# Patient Record
Sex: Female | Born: 1990 | Race: Black or African American | Hispanic: No | Marital: Single | Smoking: Never smoker
Health system: Southern US, Community
[De-identification: ages and names within clinical notes are randomized; demographics above are authoritative.]

## PROBLEM LIST (undated history)

## (undated) ENCOUNTER — Inpatient Hospital Stay (HOSPITAL_COMMUNITY): Payer: Self-pay

## (undated) DIAGNOSIS — R1110 Vomiting, unspecified: Secondary | ICD-10-CM

## (undated) DIAGNOSIS — I10 Essential (primary) hypertension: Secondary | ICD-10-CM

## (undated) DIAGNOSIS — J387 Other diseases of larynx: Secondary | ICD-10-CM

## (undated) DIAGNOSIS — D573 Sickle-cell trait: Secondary | ICD-10-CM

## (undated) DIAGNOSIS — J45990 Exercise induced bronchospasm: Secondary | ICD-10-CM

## (undated) HISTORY — PX: WISDOM TOOTH EXTRACTION: SHX21

## (undated) HISTORY — PX: UPPER GI ENDOSCOPY: SHX6162

---
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
## 2010-03-28 ENCOUNTER — Emergency Department (HOSPITAL_COMMUNITY): Admission: EM | Admit: 2010-03-28 | Discharge: 2010-03-28 | Payer: Self-pay | Admitting: Family Medicine

## 2011-06-06 ENCOUNTER — Encounter (HOSPITAL_COMMUNITY): Payer: Self-pay | Admitting: *Deleted

## 2011-06-06 ENCOUNTER — Emergency Department (HOSPITAL_COMMUNITY)
Admission: EM | Admit: 2011-06-06 | Discharge: 2011-06-06 | Disposition: A | Discharge status: Home / Self Care | Payer: PRIVATE HEALTH INSURANCE | Attending: Emergency Medicine | Admitting: Emergency Medicine

## 2011-06-06 ENCOUNTER — Emergency Department (HOSPITAL_COMMUNITY): Payer: PRIVATE HEALTH INSURANCE

## 2011-06-06 DIAGNOSIS — T733XXA Exhaustion due to excessive exertion, initial encounter: Secondary | ICD-10-CM | POA: Insufficient documentation

## 2011-06-06 DIAGNOSIS — Y9302 Activity, running: Secondary | ICD-10-CM | POA: Insufficient documentation

## 2011-06-06 DIAGNOSIS — IMO0002 Reserved for concepts with insufficient information to code with codable children: Secondary | ICD-10-CM | POA: Insufficient documentation

## 2011-06-06 DIAGNOSIS — S8391XA Sprain of unspecified site of right knee, initial encounter: Secondary | ICD-10-CM

## 2011-06-06 DIAGNOSIS — M25569 Pain in unspecified knee: Secondary | ICD-10-CM | POA: Insufficient documentation

## 2011-06-06 MED ORDER — IBUPROFEN 800 MG PO TABS: 800.0000 mg | ORAL_TABLET | Freq: Three times a day (TID) | ORAL | Status: AC

## 2011-06-06 NOTE — Progress Notes (Signed)
Orthopedic Tech Progress Note Patient Details:  Elizabeth Fisher 08/17/1990 [id number]  Other Ortho Devices Type of Ortho Device: Knee Sleeve Ortho Device Location: right knee Ortho Device Interventions: Application   Nikki Dom 06/06/2011, 11:21 PM

## 2011-06-06 NOTE — ED Notes (Signed)
TRANSPORTED TO X-RAY BY RAD TECH.

## 2011-06-06 NOTE — ED Notes (Signed)
Rt knee pain since last Wednesday.  No known injury

## 2011-06-06 NOTE — ED Provider Notes (Signed)
History     CSN: [id number]  Arrival date & time 06/06/11  2035   First MD Initiated Contact with Patient 06/06/11 2127      Chief Complaint  Patient presents with  . Knee Pain    (Consider location/radiation/quality/duration/timing/severity/associated sxs/prior treatment) HPI Comments: Patient reports right lateral knee pain for about 4-5 days - she denies any injury to the knee but reports that she runs about 2 miles per day - states she has the sensation that her knee may "give out" - denies any swelling to the knee  Patient is a 21 y.o. female presenting with knee pain. The history is provided by the patient. No language interpreter was used.  Knee Pain This is a new problem. The current episode started in the past 7 days. The problem occurs constantly. The problem has been unchanged. Associated symptoms include arthralgias. Pertinent negatives include no anorexia, chest pain, chills, coughing, diaphoresis, fatigue, fever, joint swelling, myalgias, neck pain, numbness, rash, sore throat, visual change, vomiting or weakness. The symptoms are aggravated by bending and twisting. She has tried nothing for the symptoms. The treatment provided no relief.    History reviewed. No pertinent past medical history.  History reviewed. No pertinent past surgical history.  History reviewed. No pertinent family history.  History  Substance Use Topics  . Smoking status: Never Smoker   . Smokeless tobacco: Not on file  . Alcohol Use: No    OB History    Grav Para Term Preterm Abortions TAB SAB Ect Mult Living                  Review of Systems  Constitutional: Negative for fever, chills, diaphoresis and fatigue.  HENT: Negative for sore throat and neck pain.   Respiratory: Negative for cough.   Cardiovascular: Negative for chest pain.  Gastrointestinal: Negative for vomiting and anorexia.  Musculoskeletal: Positive for arthralgias. Negative for myalgias and joint swelling.  Skin:  Negative for rash.  Neurological: Negative for weakness and numbness.  All other systems reviewed and are negative.    Allergies  Review of patient's allergies indicates no known allergies.  Home Medications  No current outpatient prescriptions on file.  BP 121/80  Pulse 74  Temp(Src) 98.1 F (36.7 C) (Oral)  Resp 18  SpO2 100%  LMP 05/26/2011  Physical Exam  Nursing note and vitals reviewed. Constitutional: She is oriented to person, place, and time. She appears well-developed and well-nourished. No distress.  HENT:  Head: Normocephalic and atraumatic.  Right Ear: External ear normal.  Left Ear: External ear normal.  Nose: Nose normal.  Mouth/Throat: Oropharynx is clear and moist. No oropharyngeal exudate.  Eyes: Conjunctivae are normal. Pupils are equal, round, and reactive to light. No scleral icterus.  Neck: Normal range of motion. Neck supple.  Cardiovascular: Normal rate, regular rhythm and normal heart sounds.  Exam reveals no gallop and no friction rub.   No murmur heard. Pulmonary/Chest: Effort normal and breath sounds normal. No respiratory distress.  Abdominal: Soft. Bowel sounds are normal. There is no tenderness.  Musculoskeletal:       Right knee: She exhibits normal range of motion, no swelling, no effusion, no deformity, normal alignment, no LCL laxity, normal meniscus and no MCL laxity. tenderness found. Lateral joint line tenderness noted.  Lymphadenopathy:    She has no cervical adenopathy.  Neurological: She is alert and oriented to person, place, and time. No cranial nerve deficit.  Skin: Skin is warm and dry. No rash  noted. No erythema. No pallor.  Psychiatric: She has a normal mood and affect. Her behavior is normal. Judgment and thought content normal.    ED Course  Procedures (including critical care time)  Labs Reviewed - No data to display Dg Knee Complete 4 Views Right  06/06/2011  *RADIOLOGY REPORT*  Clinical Data: Lateral right knee pain  for 5 days.  No injury.  RIGHT KNEE - COMPLETE 4+ VIEW  Comparison: None.  Findings: The right knee appears intact. No evidence of acute fracture or subluxation.  No focal bone lesions.  Bone matrix and cortex appear intact.  No abnormal radiopaque densities in the soft tissues.  No significant effusion.  IMPRESSION: No acute bony abnormalities demonstrated.  Original Report Authenticated By: Marlon Pel, M.D.     Right knee pain    MDM  No evidence of fracture, no ligamental laxity, negative meniscus injury signs, I suspect sprain - will place in knee sleeve and anti-inflammatories - will refer to ortho for further evaluation.        Izola Price [place], [place] 06/06/11 2309

## 2011-06-08 NOTE — ED Provider Notes (Signed)
Medical screening examination/treatment/procedure(s) were performed by non-physician practitioner and as supervising physician I was immediately available for consultation/collaboration.   Elaiza Shoberg L Evamae Rowen, MD 06/08/11 2307 

## 2011-07-04 ENCOUNTER — Encounter (HOSPITAL_COMMUNITY): Payer: Self-pay | Admitting: *Deleted

## 2011-07-04 ENCOUNTER — Emergency Department (HOSPITAL_COMMUNITY)
Admission: EM | Admit: 2011-07-04 | Discharge: 2011-07-04 | Disposition: A | Discharge status: Home / Self Care | Payer: PRIVATE HEALTH INSURANCE | Source: Home / Self Care | Attending: Emergency Medicine | Admitting: Emergency Medicine

## 2011-07-04 DIAGNOSIS — B2790 Infectious mononucleosis, unspecified without complication: Secondary | ICD-10-CM

## 2011-07-04 LAB — POCT INFECTIOUS MONO SCREEN: Mono Screen: POSITIVE — AB

## 2011-07-04 MED ORDER — IBUPROFEN 600 MG PO TABS: 600.0000 mg | ORAL_TABLET | Freq: Four times a day (QID) | ORAL | Status: AC | PRN

## 2011-07-04 MED ORDER — LIDOCAINE VISCOUS 2 % MT SOLN: 10.0000 mL | Freq: Three times a day (TID) | OROMUCOSAL | Status: AC | PRN

## 2011-07-04 NOTE — ED Provider Notes (Signed)
History     CSN: [id number]  Arrival date & time 07/04/11  7829   First MD Initiated Contact with Patient 07/04/11 1103      Chief Complaint  Patient presents with  . Sore Throat    (Consider location/radiation/quality/duration/timing/severity/associated sxs/prior treatment) HPI Comments: Patient with sore throat starting a week ago. States his throat is sore all day long, and not worse in the morning or in the evening when she lies down or after she eats. No rhinorrhea, postnasal drip, coughing, wheezing, shortness of breath, abdominal pain, rash. Patient states that she had some reflux symptoms, followed by the sore throat, but has not had any further episodes of reflux. No nausea, vomiting, drooling, trismus, voice changes. No excessive fatigue. No known sick contacts  ROS as noted in HPI. All other ROS negative.   Patient is a 21 y.o. female presenting with pharyngitis. The history is provided by the patient.  Sore Throat This is a new problem. The current episode started more than 2 days ago. The problem occurs constantly. The problem has not changed since onset.Pertinent negatives include no chest pain and no abdominal pain. The symptoms are aggravated by swallowing. She has tried nothing for the symptoms. The treatment provided no relief.    History reviewed. No pertinent past medical history.  History reviewed. No pertinent past surgical history.  History reviewed. No pertinent family history.  History  Substance Use Topics  . Smoking status: Never Smoker   . Smokeless tobacco: Not on file  . Alcohol Use: No    OB History    Grav Para Term Preterm Abortions TAB SAB Ect Mult Living                  Review of Systems  Cardiovascular: Negative for chest pain.  Gastrointestinal: Negative for abdominal pain.    Allergies  Review of patient's allergies indicates no known allergies.  Home Medications   Current Outpatient Rx  Name Route Sig Dispense Refill  .  IBUPROFEN 600 MG PO TABS Oral Take 1 tablet (600 mg total) by mouth every 6 (six) hours as needed for pain. 30 tablet 0  . LIDOCAINE VISCOUS 2 % MT SOLN Oral Take 10 mLs by mouth 3 (three) times daily as needed for pain. Swish and spit. Do not swallow. 100 mL 0    BP 104/69  Pulse 78  Temp(Src) 98 F (36.7 C) (Oral)  Resp 14  SpO2 100%  LMP 06/19/2011  Physical Exam  Nursing note and vitals reviewed. Constitutional: She is oriented to person, place, and time. She appears well-developed and well-nourished. No distress.  HENT:  Head: Normocephalic and atraumatic. No trismus in the jaw.  Right Ear: Tympanic membrane normal.  Left Ear: Tympanic membrane normal.  Nose: Nose normal.  Mouth/Throat: Uvula is midline. Posterior oropharyngeal erythema present. No tonsillar abscesses.       Tonsils red, slightly enlarged. Exudates bilaterally  Eyes: Conjunctivae and EOM are normal.  Neck: Normal range of motion. Neck supple.       Shotty cervical lymphadenopathy bilaterally  Cardiovascular: Normal rate, regular rhythm, normal heart sounds and intact distal pulses.   Pulmonary/Chest: Effort normal.  Abdominal: Bowel sounds are normal. She exhibits no distension. There is no splenomegaly.  Musculoskeletal: Normal range of motion.  Neurological: She is alert and oriented to person, place, and time.  Skin: Skin is warm and dry.  Psychiatric: She has a normal mood and affect. Her behavior is normal. Judgment and thought content  normal.    ED Course  Procedures (including critical care time)  Labs Reviewed  POCT INFECTIOUS MONO SCREEN - Abnormal; Notable for the following:    Mono Screen POSITIVE (*) POINT OF CARE RESULT   All other components within normal limits  POCT RAPID STREP A ([facility] ONLY)  STREP A DNA PROBE   No results found.   1. Mononucleosis     Results for orders placed during the hospital encounter of 07/04/11  POCT RAPID STREP A ([facility] ONLY)       Component Value Range   Streptococcus, Group A Screen (Direct) NEGATIVE  NEGATIVE   POCT INFECTIOUS MONO SCREEN      Component Value Range   Mono Screen POSITIVE (*) NEGATIVE      MDM  Rapid strep negative. Patient with significant exudates and tonsils. Will check mono. If this is negative, we'll send off a throat culture. Patient agrees to provide working phone number. If her results positive for strep, will call in penicillin.  Monospot positive. Discussed results with patient. Do supportive treatment, and have her followup with primary care physician.  Luiz Blare, MD 07/04/11 1341

## 2011-07-04 NOTE — Discharge Instructions (Signed)
He may take medication as written. Do not swallow the viscous lidocaine. Followup with your Dr. prior to returning to full physical activity/contact sports. Return if he is worse, or for any other concerns.   Infectious Mononucleosis Mono (infectiousmononucleosis) is a common viral infection that is caused by Epstein-Barr virus (EBV). Mono is contagious, and is commonly spread via saliva. This is why it is also known as the "kissing disease." Often, children with the virus may have no symptoms. However, in adults and adolescents, mono may cause an individual to miss days of work or school. SYMPTOMS   No symptoms, for up to a month after being infected.   Extreme fatigue.   Tiredness (sleeping 12 to 16 hours a day.)   Fever.   Headaches.   Muscle aches.   Sore throat.   Swollen bumps on the neck that you can feel, and are tender (lymph nodes).   Loss of appetite.   Nausea.   Joint aches.   Rash.   Feeling of fullness in your stomach.  PREVENTION   Avoid contact with infected saliva.   Avoid sharing eating utensils.   Avoid sharing food.  TREATMENT  Mono has no specific treatment. It is recommended that individuals with the illness rest and drink plenty of fluids. Over-the-counter medicines for fever and sore throat may be taken, if such symptoms are present. Rarely, the infection may cause an abscess (collection of pus surrounded by inflamed tissue) in the tonsils, for which antibiotics will be prescribed. Mono typically causes the liver and spleen to become enlarged. For this reason, you should avoid drinking alcohol, contact sports, heavy lifting, or any strenuous exercise, to reduce the risk of rupturing your spleen, until it returns to normal size. Symptoms typically improve after 1 to 2 weeks, but returning to sports may take a couple months. Document Released: 04/25/2005 Document Revised: 01/05/2011 Document Reviewed: 08/07/2008 [facility] Patient Information 2012  [place], [place].

## 2011-07-04 NOTE — ED Notes (Signed)
Pt  Reports  Symptoms  Of sorethroat  With pain on  Swallowing   -  Symptoms    X  1  Week  Pt  Sitting upright on  Exam table appears  In no  Acute  Distress    Skin is  Warm  /  Dry  Speaking in  Complete  sentances

## 2011-07-05 LAB — STREP A DNA PROBE: Group A Strep Probe: NEGATIVE

## 2013-06-21 ENCOUNTER — Emergency Department (INDEPENDENT_AMBULATORY_CARE_PROVIDER_SITE_OTHER)
Admission: EM | Admit: 2013-06-21 | Discharge: 2013-06-21 | Disposition: A | Discharge status: Home / Self Care | Payer: 59 | Source: Home / Self Care

## 2013-06-21 ENCOUNTER — Encounter (HOSPITAL_COMMUNITY): Payer: Self-pay | Admitting: Emergency Medicine

## 2013-06-21 DIAGNOSIS — R1013 Epigastric pain: Secondary | ICD-10-CM

## 2013-06-21 DIAGNOSIS — K5900 Constipation, unspecified: Secondary | ICD-10-CM

## 2013-06-21 DIAGNOSIS — R112 Nausea with vomiting, unspecified: Secondary | ICD-10-CM

## 2013-06-21 LAB — POCT URINALYSIS DIP (DEVICE)
BILIRUBIN URINE: NEGATIVE
GLUCOSE, UA: NEGATIVE mg/dL
KETONES UR: NEGATIVE mg/dL
LEUKOCYTES UA: NEGATIVE
Nitrite: NEGATIVE
PROTEIN: NEGATIVE mg/dL
SPECIFIC GRAVITY, URINE: 1.01 (ref 1.005–1.030)
Urobilinogen, UA: 0.2 mg/dL (ref 0.0–1.0)
pH: 7 (ref 5.0–8.0)

## 2013-06-21 LAB — POCT PREGNANCY, URINE: PREG TEST UR: NEGATIVE

## 2013-06-21 MED ORDER — ONDANSETRON HCL 4 MG PO TABS: 4.0000 mg | ORAL_TABLET | Freq: Four times a day (QID) | ORAL | Status: DC

## 2013-06-21 NOTE — ED Notes (Signed)
Pt c/o epigastric pain that woke her up today around 0700 Reports x1 episode of emesis.  Denies f/d, urinary sxs, gyn sxs LMP = today  Alert w/no signs of acute distress.

## 2013-06-21 NOTE — ED Provider Notes (Signed)
CSN: [id number]     Arrival date & time 06/21/13  1048 History   First MD Initiated Contact with Patient 06/21/13 1155     Chief Complaint  Patient presents with  . Abdominal Pain     (Consider location/radiation/quality/duration/timing/severity/associated sxs/prior Treatment) HPI Comments: 23 year old female awoke this morning approximately 700 hours with epigastric pain followed by an episode of vomiting. States that she felt like her abdomen was swollen and bloated. She has had nausea most of this morning. The pain has migrated from the epigastrium to the upper right chest into the back. Denies fever, upper respiratory symptoms or constitutional symptoms.   History reviewed. No pertinent past medical history. History reviewed. No pertinent past surgical history. No family history on file. History  Substance Use Topics  . Smoking status: Never Smoker   . Smokeless tobacco: Not on file  . Alcohol Use: No   OB History   Grav Para Term Preterm Abortions TAB SAB Ect Mult Living                 Review of Systems  Constitutional: Positive for activity change and appetite change. Negative for fever, chills and fatigue.  HENT: Negative.   Respiratory: Negative for cough, choking, chest tightness and shortness of breath.   Cardiovascular: Positive for chest pain.  Gastrointestinal: Positive for nausea, vomiting, abdominal pain and constipation. Negative for diarrhea and blood in stool.  Genitourinary: Negative.   Musculoskeletal: Negative.   Neurological: Negative.   Psychiatric/Behavioral: Negative.       Allergies  Review of patient's allergies indicates no known allergies.  Home Medications   Current Outpatient Rx  Name  Route  Sig  Dispense  Refill  . ondansetron (ZOFRAN) 4 MG tablet   Oral   Take 1 tablet (4 mg total) by mouth every 6 (six) hours.   12 tablet   0    BP 126/75  Pulse 65  Temp(Src) 98.4 F (36.9 C) (Oral)  Resp 18  SpO2 99%  LMP  06/21/2013 Physical Exam  Nursing note and vitals reviewed. Constitutional: She is oriented to person, place, and time. She appears well-developed and well-nourished. No distress.  Eyes: Conjunctivae and EOM are normal.  Neck: Normal range of motion. Neck supple.  Cardiovascular: Normal rate, regular rhythm and normal heart sounds.   Pulmonary/Chest: Effort normal and breath sounds normal. No respiratory distress. She has no wheezes. She has no rales. She exhibits tenderness.  There is moderate to severe tenderness to the right para sternum. Patient states this reproduced the pain for which he initially complained.  The pain in the back is located primarily to the left back at the level of the upper abdomen, just opposite of where the left upper quadrant tenderness was located.  Abdominal: Soft. Bowel sounds are normal. She exhibits no distension and no mass. There is tenderness. There is no rebound and no guarding.  Minor tenderness in the epigastrium. Mild tenderness in the upper most left upper quadrant. This is referred posteriorly. The abdomen percus tympanic in the upper half and the lower half.  Musculoskeletal: She exhibits no edema and no tenderness.  Neurological: She is alert and oriented to person, place, and time. She exhibits normal muscle tone.  Skin: Skin is warm and dry. No rash noted.  Psychiatric: She has a normal mood and affect.    ED Course  Procedures (including critical care time) Labs Review Labs Reviewed  POCT URINALYSIS DIP (DEVICE) - Abnormal; Notable for the following:  Hgb urine dipstick TRACE (*)    All other components within normal limits  POCT PREGNANCY, URINE   Imaging Review No results found. Results for orders placed during the hospital encounter of 06/21/13  POCT URINALYSIS DIP (DEVICE)      Result Value Ref Range   Glucose, UA NEGATIVE  NEGATIVE mg/dL   Bilirubin Urine NEGATIVE  NEGATIVE   Ketones, ur NEGATIVE  NEGATIVE mg/dL   Specific  Gravity, Urine 1.010  1.005 - 1.030   Hgb urine dipstick TRACE (*) NEGATIVE   pH 7.0  5.0 - 8.0   Protein, ur NEGATIVE  NEGATIVE mg/dL   Urobilinogen, UA 0.2  0.0 - 1.0 mg/dL   Nitrite NEGATIVE  NEGATIVE   Leukocytes, UA NEGATIVE  NEGATIVE  POCT PREGNANCY, URINE      Result Value Ref Range   Preg Test, Ur NEGATIVE  NEGATIVE      MDM   Final diagnoses:  Epigastric pain  Constipation  Nausea and vomiting    Patient has not had a bowel movement in 2 days I suspect part of the problem is constipation. Her abdomen percusses tympanic in the epigastrium and upper quadrants. It is dull in the lower quadrants. Minor tenderness in the epigastrium. She does not have an acute abdomen. Zofran 4 mg every 6 hours when necessary nausea and vomiting Recommend MiraLax as directed for constipation Eat. higher fiber foods.    Hayden Rasmussen, NP 06/21/13 1230

## 2013-06-21 NOTE — ED Provider Notes (Signed)
Medical screening examination/treatment/procedure(s) were performed by resident physician or non-physician practitioner and as supervising physician I was immediately available for consultation/collaboration.   Barkley BrunsKINDL,Mauri Tolen DOUGLAS MD.   Linna HoffJames D Asa Baudoin, MD 06/21/13 1310

## 2013-06-21 NOTE — Discharge Instructions (Signed)
Abdominal Pain, Women °Abdominal (stomach, pelvic, or belly) pain can be caused by many things. It is important to tell your doctor: °· The location of the pain. °· Does it come and go or is it present all the time? °· Are there things that start the pain (eating certain foods, exercise)? °· Are there other symptoms associated with the pain (fever, nausea, vomiting, diarrhea)? °All of this is helpful to know when trying to find the cause of the pain. °CAUSES  °· Stomach: virus or bacteria infection, or ulcer. °· Intestine: appendicitis (inflamed appendix), regional ileitis (Crohn's disease), ulcerative colitis (inflamed colon), irritable bowel syndrome, diverticulitis (inflamed diverticulum of the colon), or cancer of the stomach or intestine. °· Gallbladder disease or stones in the gallbladder. °· Kidney disease, kidney stones, or infection. °· Pancreas infection or cancer. °· Fibromyalgia (pain disorder). °· Diseases of the female organs: °· Uterus: fibroid (non-cancerous) tumors or infection. °· Fallopian tubes: infection or tubal pregnancy. °· Ovary: cysts or tumors. °· Pelvic adhesions (scar tissue). °· Endometriosis (uterus lining tissue growing in the pelvis and on the pelvic organs). °· Pelvic congestion syndrome (female organs filling up with blood just before the menstrual period). °· Pain with the menstrual period. °· Pain with ovulation (producing an egg). °· Pain with an IUD (intrauterine device, birth control) in the uterus. °· Cancer of the female organs. °· Functional pain (pain not caused by a disease, may improve without treatment). °· Psychological pain. °· Depression. °DIAGNOSIS  °Your doctor will decide the seriousness of your pain by doing an examination. °· Blood tests. °· X-rays. °· Ultrasound. °· CT scan (computed tomography, special type of X-ray). °· MRI (magnetic resonance imaging). °· Cultures, for infection. °· Barium enema (dye inserted in the large intestine, to better view it with  X-rays). °· Colonoscopy (looking in intestine with a lighted tube). °· Laparoscopy (minor surgery, looking in abdomen with a lighted tube). °· Major abdominal exploratory surgery (looking in abdomen with a large incision). °TREATMENT  °The treatment will depend on the cause of the pain.  °· Many cases can be observed and treated at home. °· Over-the-counter medicines recommended by your caregiver. °· Prescription medicine. °· Antibiotics, for infection. °· Birth control pills, for painful periods or for ovulation pain. °· Hormone treatment, for endometriosis. °· Nerve blocking injections. °· Physical therapy. °· Antidepressants. °· Counseling with a psychologist or psychiatrist. °· Minor or major surgery. °HOME CARE INSTRUCTIONS  °· Do not take laxatives, unless directed by your caregiver. °· Take over-the-counter pain medicine only if ordered by your caregiver. Do not take aspirin because it can cause an upset stomach or bleeding. °· Try a clear liquid diet (broth or water) as ordered by your caregiver. Slowly move to a bland diet, as tolerated, if the pain is related to the stomach or intestine. °· Have a thermometer and take your temperature several times a day, and record it. °· Bed rest and sleep, if it helps the pain. °· Avoid sexual intercourse, if it causes pain. °· Avoid stressful situations. °· Keep your follow-up appointments and tests, as your caregiver orders. °· If the pain does not go away with medicine or surgery, you may try: °· Acupuncture. °· Relaxation exercises (yoga, meditation). °· Group therapy. °· Counseling. °SEEK MEDICAL CARE IF:  °· You notice certain foods cause stomach pain. °· Your home care treatment is not helping your pain. °· You need stronger pain medicine. °· You want your IUD removed. °· You feel faint or   lightheaded.  You develop nausea and vomiting.  You develop a rash.  You are having side effects or an allergy to your medicine. SEEK IMMEDIATE MEDICAL CARE IF:   Your  pain does not go away or gets worse.  You have a fever.  Your pain is felt only in portions of the abdomen. The right side could possibly be appendicitis. The left lower portion of the abdomen could be colitis or diverticulitis.  You are passing blood in your stools (bright red or black tarry stools, with or without vomiting).  You have blood in your urine.  You develop chills, with or without a fever.  You pass out. MAKE SURE YOU:   Understand these instructions.  Will watch your condition.  Will get help right away if you are not doing well or get worse. Document Released: 02/20/2007 Document Revised: 07/18/2011 Document Reviewed: 03/12/2009 [facility] Patient Information 2014 [place], [place].  Constipation, Adult Constipation is when a person has fewer than 3 bowel movements a week; has difficulty having a bowel movement; or has stools that are dry, hard, or larger than normal. As people grow older, constipation is more common. If you try to fix constipation with medicines that make you have a bowel movement (laxatives), the problem may get worse. Long-term laxative use may cause the muscles of the colon to become weak. A low-fiber diet, not taking in enough fluids, and taking certain medicines may make constipation worse. CAUSES   Certain medicines, such as antidepressants, pain medicine, iron supplements, antacids, and water pills.   Certain diseases, such as diabetes, irritable bowel syndrome (IBS), thyroid disease, or depression.   Not drinking enough water.   Not eating enough fiber-rich foods.   Stress or travel.  Lack of physical activity or exercise.  Not going to the restroom when there is the urge to have a bowel movement.  Ignoring the urge to have a bowel movement.  Using laxatives too much. SYMPTOMS   Having fewer than 3 bowel movements a week.   Straining to have a bowel movement.   Having hard, dry, or larger than normal stools.   Feeling  full or bloated.   Pain in the lower abdomen.  Not feeling relief after having a bowel movement. DIAGNOSIS  Your caregiver will take a medical history and perform a physical exam. Further testing may be done for severe constipation. Some tests may include:   A barium enema X-ray to examine your rectum, colon, and sometimes, your small intestine.  A sigmoidoscopy to examine your lower colon.  A colonoscopy to examine your entire colon. TREATMENT  Treatment will depend on the severity of your constipation and what is causing it. Some dietary treatments include drinking more fluids and eating more fiber-rich foods. Lifestyle treatments may include regular exercise. If these diet and lifestyle recommendations do not help, your caregiver may recommend taking over-the-counter laxative medicines to help you have bowel movements. Prescription medicines may be prescribed if over-the-counter medicines do not work.  HOME CARE INSTRUCTIONS   Increase dietary fiber in your diet, such as fruits, vegetables, whole grains, and beans. Limit high-fat and processed sugars in your diet, such as [place] fries, hamburgers, cookies, candies, and soda.   A fiber supplement may be added to your diet if you cannot get enough fiber from foods.   Drink enough fluids to keep your urine clear or pale yellow.   Exercise regularly or as directed by your caregiver.   Go to the restroom when you have  the urge to go. Do not hold it.  Only take medicines as directed by your caregiver. Do not take other medicines for constipation without talking to your caregiver first. SEEK IMMEDIATE MEDICAL CARE IF:   You have bright red blood in your stool.   Your constipation lasts for more than 4 days or gets worse.   You have abdominal or rectal pain.   You have thin, pencil-like stools.  You have unexplained weight loss. MAKE SURE YOU:   Understand these instructions.  Will watch your condition.  Will get help  right away if you are not doing well or get worse. Document Released: 01/22/2004 Document Revised: 07/18/2011 Document Reviewed: 02/04/2013 [facility] Patient Information 2014 [place], [place].  Nausea and Vomiting Nausea means you feel sick to your stomach. Throwing up (vomiting) is a reflex where stomach contents come out of your mouth. HOME CARE   Take medicine as told by your doctor.  Do not force yourself to eat. However, you do need to drink fluids.  If you feel like eating, eat a normal diet as told by your doctor.  Eat rice, wheat, potatoes, bread, lean meats, yogurt, fruits, and vegetables.  Avoid high-fat foods.  Drink enough fluids to keep your pee (urine) clear or pale yellow.  Ask your doctor how to replace body fluid losses (rehydrate). Signs of body fluid loss (dehydration) include:  Feeling very thirsty.  Dry lips and mouth.  Feeling dizzy.  Dark pee.  Peeing less than normal.  Feeling confused.  Fast breathing or heart rate. GET HELP RIGHT AWAY IF:   You have blood in your throw up.  You have black or bloody poop (stool).  You have a bad headache or stiff neck.  You feel confused.  You have bad belly (abdominal) pain.  You have chest pain or trouble breathing.  You do not pee at least once every 8 hours.  You have cold, clammy skin.  You keep throwing up after 24 to 48 hours.  You have a fever. MAKE SURE YOU:   Understand these instructions.  Will watch your condition.  Will get help right away if you are not doing well or get worse. Document Released: 10/12/2007 Document Revised: 07/18/2011 Document Reviewed: 09/24/2010 [facility] Patient Information 2014 [place], [place].

## 2013-07-03 ENCOUNTER — Emergency Department (HOSPITAL_COMMUNITY)
Admission: EM | Admit: 2013-07-03 | Discharge: 2013-07-03 | Discharge status: Against Medical Advice | Payer: 59 | Attending: Emergency Medicine | Admitting: Emergency Medicine

## 2013-07-03 ENCOUNTER — Encounter (HOSPITAL_COMMUNITY): Payer: Self-pay | Admitting: Emergency Medicine

## 2013-07-03 DIAGNOSIS — R112 Nausea with vomiting, unspecified: Secondary | ICD-10-CM | POA: Insufficient documentation

## 2013-07-03 NOTE — ED Notes (Signed)
Pt decided to leave.  Was wanting to know if house might be causing her symptoms.  Having construction work done

## 2013-10-04 ENCOUNTER — Emergency Department (HOSPITAL_COMMUNITY)
Admission: EM | Admit: 2013-10-04 | Discharge: 2013-10-05 | Disposition: A | Discharge status: Home / Self Care | Payer: 59 | Attending: Emergency Medicine | Admitting: Emergency Medicine

## 2013-10-04 ENCOUNTER — Encounter (HOSPITAL_COMMUNITY): Payer: Self-pay | Admitting: Emergency Medicine

## 2013-10-04 DIAGNOSIS — K2990 Gastroduodenitis, unspecified, without bleeding: Principal | ICD-10-CM

## 2013-10-04 DIAGNOSIS — Z3202 Encounter for pregnancy test, result negative: Secondary | ICD-10-CM | POA: Insufficient documentation

## 2013-10-04 DIAGNOSIS — Z79899 Other long term (current) drug therapy: Secondary | ICD-10-CM | POA: Insufficient documentation

## 2013-10-04 DIAGNOSIS — Z862 Personal history of diseases of the blood and blood-forming organs and certain disorders involving the immune mechanism: Secondary | ICD-10-CM | POA: Insufficient documentation

## 2013-10-04 DIAGNOSIS — K2970 Gastritis, unspecified, without bleeding: Secondary | ICD-10-CM | POA: Insufficient documentation

## 2013-10-04 HISTORY — DX: Sickle-cell trait: D57.3

## 2013-10-04 LAB — CBC WITH DIFFERENTIAL/PLATELET
BASOS PCT: 1 % (ref 0–1)
Basophils Absolute: 0 10*3/uL (ref 0.0–0.1)
EOS ABS: 0.1 10*3/uL (ref 0.0–0.7)
EOS PCT: 3 % (ref 0–5)
HCT: 37.2 % (ref 36.0–46.0)
Hemoglobin: 12.7 g/dL (ref 12.0–15.0)
LYMPHS ABS: 1.8 10*3/uL (ref 0.7–4.0)
Lymphocytes Relative: 39 % (ref 12–46)
MCH: 26.9 pg (ref 26.0–34.0)
MCHC: 34.1 g/dL (ref 30.0–36.0)
MCV: 78.8 fL (ref 78.0–100.0)
MONOS PCT: 8 % (ref 3–12)
Monocytes Absolute: 0.4 10*3/uL (ref 0.1–1.0)
NEUTROS PCT: 49 % (ref 43–77)
Neutro Abs: 2.3 10*3/uL (ref 1.7–7.7)
PLATELETS: 279 10*3/uL (ref 150–400)
RBC: 4.72 MIL/uL (ref 3.87–5.11)
RDW: 12.4 % (ref 11.5–15.5)
WBC: 4.7 10*3/uL (ref 4.0–10.5)

## 2013-10-04 LAB — URINALYSIS, ROUTINE W REFLEX MICROSCOPIC
BILIRUBIN URINE: NEGATIVE
GLUCOSE, UA: NEGATIVE mg/dL
Hgb urine dipstick: NEGATIVE
KETONES UR: NEGATIVE mg/dL
LEUKOCYTES UA: NEGATIVE
Nitrite: NEGATIVE
PH: 6 (ref 5.0–8.0)
PROTEIN: NEGATIVE mg/dL
Specific Gravity, Urine: 1.025 (ref 1.005–1.030)
Urobilinogen, UA: 1 mg/dL (ref 0.0–1.0)

## 2013-10-04 LAB — COMPREHENSIVE METABOLIC PANEL
ALBUMIN: 4.2 g/dL (ref 3.5–5.2)
ALK PHOS: 56 U/L (ref 39–117)
ALT: 15 U/L (ref 0–35)
AST: 19 U/L (ref 0–37)
BILIRUBIN TOTAL: 0.2 mg/dL — AB (ref 0.3–1.2)
BUN: 11 mg/dL (ref 6–23)
CHLORIDE: 104 meq/L (ref 96–112)
CO2: 23 meq/L (ref 19–32)
Calcium: 9.8 mg/dL (ref 8.4–10.5)
Creatinine, Ser: 0.72 mg/dL (ref 0.50–1.10)
GFR calc Af Amer: >90 mL/min (ref >90)
Glucose, Bld: 96 mg/dL (ref 70–99)
POTASSIUM: 3.6 meq/L — AB (ref 3.7–5.3)
SODIUM: 140 meq/L (ref 137–147)
Total Protein: 7.6 g/dL (ref 6.0–8.3)

## 2013-10-04 LAB — LIPASE, BLOOD: Lipase: 28 U/L (ref 11–59)

## 2013-10-04 LAB — PREGNANCY, URINE: PREG TEST UR: NEGATIVE

## 2013-10-04 MED ORDER — GI COCKTAIL ~~LOC~~
30.0000 mL | Freq: Once | ORAL | Status: AC
Start: 2013-10-04 — End: 2013-10-04
  Administered 2013-10-04: 30 mL via ORAL
  Filled 2013-10-04: qty 30

## 2013-10-04 NOTE — ED Provider Notes (Signed)
CSN: [id number]     Arrival date & time 10/04/13  2211 History   First MD Initiated Contact with Patient 10/04/13 2307     Chief Complaint  Patient presents with  . Abdominal Pain     (Consider location/radiation/quality/duration/timing/severity/associated sxs/prior Treatment) HPI The patient has a history of episodic abdominal pain for the past 6 months. It's been worse in the last one week. Is associated with nausea and vomiting. She denies any type of blood in vomit or coffee ground emesis. Denies any melena. She was recently started on omeprazole by her primary physician 2 days ago without any improvement of her symptoms. Denies any fevers or chills. She states that she occasionally takes ibuprofen and vitamin C supplements. Patient denies any surgical history. She has no urinary symptoms. She denies any vaginal bleeding or discharge. Patient states that she occasionally feels the epigastric pain radiating up into her chest she is taste in her mouth. Past Medical History  Diagnosis Date  . Sickle cell trait    History reviewed. No pertinent past surgical history. History reviewed. No pertinent family history. History  Substance Use Topics  . Smoking status: Never Smoker   . Smokeless tobacco: Not on file  . Alcohol Use: No   OB History   Grav Para Term Preterm Abortions TAB SAB Ect Mult Living                 Review of Systems  Constitutional: Negative for fever and chills.  Respiratory: Negative for cough and shortness of breath.   Cardiovascular: Negative for chest pain.  Gastrointestinal: Positive for nausea, vomiting and abdominal pain. Negative for diarrhea and constipation.  Genitourinary: Negative for dysuria, frequency, flank pain, vaginal bleeding, vaginal discharge and enuresis.  Musculoskeletal: Negative for back pain.  Skin: Negative for rash and wound.  Neurological: Negative for dizziness, weakness, light-headedness, numbness and headaches.  All other systems  reviewed and are negative.     Allergies  Other and Shellfish allergy  Home Medications   Prior to Admission medications   Medication Sig Start Date End Date Taking? Authorizing Provider  Ascorbic Acid (VITAMIN C PO) Take 1 tablet by mouth daily.   Yes Historical Provider, MD  ibuprofen (ADVIL,MOTRIN) 200 MG tablet Take 1,000 mg by mouth every 6 (six) hours as needed.   Yes Historical Provider, MD  omeprazole (PRILOSEC) 40 MG capsule Take 40 mg by mouth daily.   Yes Historical Provider, MD   BP 123/78  Pulse 75  Temp(Src) 98.6 F (37 C) (Oral)  Resp 18  SpO2 99%  LMP 09/27/2013 Physical Exam  Nursing note and vitals reviewed. Constitutional: She is oriented to person, place, and time. She appears well-developed and well-nourished. No distress.  HENT:  Head: Normocephalic and atraumatic.  Mouth/Throat: Oropharynx is clear and moist.  Eyes: EOM are normal. Pupils are equal, round, and reactive to light.  Neck: Normal range of motion. Neck supple.  Cardiovascular: Normal rate and regular rhythm.   Pulmonary/Chest: Effort normal and breath sounds normal. No respiratory distress. She has no wheezes. She has no rales.  Abdominal: Soft. Bowel sounds are normal. She exhibits no distension and no mass. There is tenderness (epigastric tenderness to palpation.). There is no rebound and no guarding.  Musculoskeletal: Normal range of motion. She exhibits no edema and no tenderness.  The CVA tenderness bilaterally.  Neurological: She is alert and oriented to person, place, and time.  Skin: Skin is warm and dry. No rash noted. No erythema.  Psychiatric: She has a normal mood and affect. Her behavior is normal.    ED Course  Procedures (including critical care time) Labs Review Labs Reviewed  COMPREHENSIVE METABOLIC PANEL - Abnormal; Notable for the following:    Potassium 3.6 (*)    Total Bilirubin 0.2 (*)    All other components within normal limits  CBC WITH DIFFERENTIAL   PREGNANCY, URINE  URINALYSIS, ROUTINE W REFLEX MICROSCOPIC  LIPASE, BLOOD    Imaging Review No results found.   EKG Interpretation None      MDM   Final diagnoses:  None    Patient's abdominal pain is improved with a GI cocktail. Her symptoms are consistent with gastritis/GERD. Advised to continue with Prilosec and take over-the-counter Mylanta or Maalox as needed. Also advised dietary and lifestyle modifications. We'll give GI followup for persistent symptoms. Return precautions have been given.    Loren Racer, MD 10/05/13 [phone]

## 2013-10-04 NOTE — ED Notes (Signed)
Pt reports abd pain with n/v x 1 week, went to see her PMD Wednesday for same and was given omeprazole.  Pt reports getting worse.

## 2013-10-05 MED ORDER — ONDANSETRON 4 MG PO TBDP: ORAL_TABLET | ORAL | Status: DC

## 2013-10-05 NOTE — Discharge Instructions (Signed)
You may take over-the-counter Maalox or Mylanta for abdominal pain related to gastritis. Followup with a gastroenterologist if your symptoms persist. Return immediately for worsening pain, vomiting blood, persistent vomiting, fever or for any concerns.  Gastritis, Adult Gastritis is soreness and swelling (inflammation) of the lining of the stomach. Gastritis can develop as a sudden onset (acute) or long-term (chronic) condition. If gastritis is not treated, it can lead to stomach bleeding and ulcers. CAUSES  Gastritis occurs when the stomach lining is weak or damaged. Digestive juices from the stomach then inflame the weakened stomach lining. The stomach lining may be weak or damaged due to viral or bacterial infections. One common bacterial infection is the Helicobacter pylori infection. Gastritis can also result from excessive alcohol consumption, taking certain medicines, or having too much acid in the stomach.  SYMPTOMS  In some cases, there are no symptoms. When symptoms are present, they may include:  Pain or a burning sensation in the upper abdomen.  Nausea.  Vomiting.  An uncomfortable feeling of fullness after eating. DIAGNOSIS  Your caregiver may suspect you have gastritis based on your symptoms and a physical exam. To determine the cause of your gastritis, your caregiver may perform the following:  Blood or stool tests to check for the H pylori bacterium.  Gastroscopy. A thin, flexible tube (endoscope) is passed down the esophagus and into the stomach. The endoscope has a light and camera on the end. Your caregiver uses the endoscope to view the inside of the stomach.  Taking a tissue sample (biopsy) from the stomach to examine under a microscope. TREATMENT  Depending on the cause of your gastritis, medicines may be prescribed. If you have a bacterial infection, such as an H pylori infection, antibiotics may be given. If your gastritis is caused by too much acid in the stomach, H2  blockers or antacids may be given. Your caregiver may recommend that you stop taking aspirin, ibuprofen, or other nonsteroidal anti-inflammatory drugs (NSAIDs). HOME CARE INSTRUCTIONS  Only take over-the-counter or prescription medicines as directed by your caregiver.  If you were given antibiotic medicines, take them as directed. Finish them even if you start to feel better.  Drink enough fluids to keep your urine clear or pale yellow.  Avoid foods and drinks that make your symptoms worse, such as:  Caffeine or alcoholic drinks.  Chocolate.  Peppermint or mint flavorings.  Garlic and onions.  Spicy foods.  Citrus fruits, such as oranges, lemons, or limes.  Tomato-based foods such as sauce, chili, salsa, and pizza.  Fried and fatty foods.  Eat small, frequent meals instead of large meals. SEEK IMMEDIATE MEDICAL CARE IF:   You have black or dark red stools.  You vomit blood or material that looks like coffee grounds.  You are unable to keep fluids down.  Your abdominal pain gets worse.  You have a fever.  You do not feel better after 1 week.  You have any other questions or concerns. MAKE SURE YOU:  Understand these instructions.  Will watch your condition.  Will get help right away if you are not doing well or get worse. Document Released: 04/19/2001 Document Revised: 10/25/2011 Document Reviewed: 06/08/2011 [facility] Patient Information 2014 [place], [place].

## 2013-10-09 ENCOUNTER — Other Ambulatory Visit (HOSPITAL_COMMUNITY)
Admission: RE | Admit: 2013-10-09 | Discharge: 2013-10-09 | Disposition: A | Discharge status: Home / Self Care | Payer: 59 | Source: Ambulatory Visit | Attending: Internal Medicine | Admitting: Internal Medicine

## 2013-10-09 ENCOUNTER — Other Ambulatory Visit: Payer: Self-pay | Admitting: Internal Medicine

## 2013-10-09 DIAGNOSIS — Z01419 Encounter for gynecological examination (general) (routine) without abnormal findings: Secondary | ICD-10-CM | POA: Insufficient documentation

## 2013-10-14 LAB — CYTOLOGY - PAP

## 2013-11-05 ENCOUNTER — Other Ambulatory Visit: Payer: Self-pay | Admitting: Gastroenterology

## 2013-11-05 ENCOUNTER — Other Ambulatory Visit (HOSPITAL_COMMUNITY): Payer: Self-pay | Admitting: Gastroenterology

## 2013-11-05 DIAGNOSIS — R1013 Epigastric pain: Secondary | ICD-10-CM

## 2013-11-08 ENCOUNTER — Emergency Department (HOSPITAL_COMMUNITY)
Admission: EM | Admit: 2013-11-08 | Discharge: 2013-11-08 | Disposition: A | Discharge status: Home / Self Care | Payer: 59 | Attending: Emergency Medicine | Admitting: Emergency Medicine

## 2013-11-08 ENCOUNTER — Emergency Department (HOSPITAL_COMMUNITY): Payer: 59

## 2013-11-08 ENCOUNTER — Encounter (HOSPITAL_COMMUNITY): Payer: Self-pay | Admitting: Emergency Medicine

## 2013-11-08 DIAGNOSIS — Z3202 Encounter for pregnancy test, result negative: Secondary | ICD-10-CM | POA: Insufficient documentation

## 2013-11-08 DIAGNOSIS — R000 Tachycardia, unspecified: Secondary | ICD-10-CM | POA: Insufficient documentation

## 2013-11-08 DIAGNOSIS — Z79899 Other long term (current) drug therapy: Secondary | ICD-10-CM | POA: Insufficient documentation

## 2013-11-08 DIAGNOSIS — R1115 Cyclical vomiting syndrome unrelated to migraine: Secondary | ICD-10-CM | POA: Insufficient documentation

## 2013-11-08 DIAGNOSIS — Z862 Personal history of diseases of the blood and blood-forming organs and certain disorders involving the immune mechanism: Secondary | ICD-10-CM | POA: Insufficient documentation

## 2013-11-08 LAB — CBC WITH DIFFERENTIAL/PLATELET
Basophils Absolute: 0 10*3/uL (ref 0.0–0.1)
Basophils Relative: 0 % (ref 0–1)
EOS ABS: 0 10*3/uL (ref 0.0–0.7)
EOS PCT: 0 % (ref 0–5)
HEMATOCRIT: 43.2 % (ref 36.0–46.0)
Hemoglobin: 14.7 g/dL (ref 12.0–15.0)
LYMPHS ABS: 0.9 10*3/uL (ref 0.7–4.0)
LYMPHS PCT: 10 % — AB (ref 12–46)
MCH: 27.8 pg (ref 26.0–34.0)
MCHC: 34 g/dL (ref 30.0–36.0)
MCV: 81.7 fL (ref 78.0–100.0)
MONO ABS: 0.3 10*3/uL (ref 0.1–1.0)
MONOS PCT: 4 % (ref 3–12)
Neutro Abs: 7.6 10*3/uL (ref 1.7–7.7)
Neutrophils Relative %: 86 % — ABNORMAL HIGH (ref 43–77)
Platelets: 268 10*3/uL (ref 150–400)
RBC: 5.29 MIL/uL — AB (ref 3.87–5.11)
RDW: 12.8 % (ref 11.5–15.5)
WBC: 8.9 10*3/uL (ref 4.0–10.5)

## 2013-11-08 LAB — COMPREHENSIVE METABOLIC PANEL
ALT: 16 U/L (ref 0–35)
ANION GAP: 18 — AB (ref 5–15)
AST: 20 U/L (ref 0–37)
Albumin: 4.3 g/dL (ref 3.5–5.2)
Alkaline Phosphatase: 59 U/L (ref 39–117)
BUN: 15 mg/dL (ref 6–23)
CALCIUM: 9.9 mg/dL (ref 8.4–10.5)
CO2: 21 meq/L (ref 19–32)
CREATININE: 0.71 mg/dL (ref 0.50–1.10)
Chloride: 101 mEq/L (ref 96–112)
GLUCOSE: 87 mg/dL (ref 70–99)
Potassium: 3.7 mEq/L (ref 3.7–5.3)
Sodium: 140 mEq/L (ref 137–147)
TOTAL PROTEIN: 8.5 g/dL — AB (ref 6.0–8.3)
Total Bilirubin: 0.4 mg/dL (ref 0.3–1.2)

## 2013-11-08 LAB — POC URINE PREG, ED: PREG TEST UR: NEGATIVE

## 2013-11-08 LAB — URINALYSIS, ROUTINE W REFLEX MICROSCOPIC
Glucose, UA: NEGATIVE mg/dL
HGB URINE DIPSTICK: NEGATIVE
Ketones, ur: >80 mg/dL — AB
Leukocytes, UA: NEGATIVE
NITRITE: NEGATIVE
PROTEIN: NEGATIVE mg/dL
Specific Gravity, Urine: 1.032 — ABNORMAL HIGH (ref 1.005–1.030)
UROBILINOGEN UA: 0.2 mg/dL (ref 0.0–1.0)
pH: 5.5 (ref 5.0–8.0)

## 2013-11-08 LAB — LIPASE, BLOOD: Lipase: 19 U/L (ref 11–59)

## 2013-11-08 MED ORDER — ONDANSETRON HCL 4 MG PO TABS: 4.0000 mg | ORAL_TABLET | Freq: Four times a day (QID) | ORAL | Status: DC

## 2013-11-08 MED ORDER — SODIUM CHLORIDE 0.9 % IV BOLUS (SEPSIS)
1000.0000 mL | Freq: Once | INTRAVENOUS | Status: AC
  Administered 2013-11-08: 1000 mL via INTRAVENOUS

## 2013-11-08 MED ORDER — GI COCKTAIL ~~LOC~~: 30.0000 mL | Freq: Once | ORAL | Status: DC

## 2013-11-08 MED ORDER — ONDANSETRON HCL 4 MG/2ML IJ SOLN
4.0000 mg | Freq: Once | INTRAMUSCULAR | Status: AC
  Administered 2013-11-08: 4 mg via INTRAVENOUS
  Filled 2013-11-08: qty 2

## 2013-11-08 NOTE — ED Notes (Signed)
Per pt sts she has been having vomiting episodes for 1 year. sts this episode started last night. Suppose to see GI on the 9th but cant make it till then. sts vomiting every 10 min.

## 2013-11-08 NOTE — Discharge Instructions (Signed)

## 2013-11-08 NOTE — ED Provider Notes (Signed)
CSN: [id number]     Arrival date & time 11/08/13  1258 History   First MD Initiated Contact with Patient 11/08/13 1321     Chief Complaint  Patient presents with  . Emesis     (Consider location/radiation/quality/duration/timing/severity/associated sxs/prior Treatment) HPI  23 year old female with history of sickle cell trait who presents for evaluation of nausea and vomiting. Patient states for the past year she has had bouts of nausea and vomiting it usually lasted for about 1-2 weeks, resolved and symptoms free for 2-3 months before it relapsed again. In the past symptoms resolves with Zofran but she ran out of the medication. She has been feeling nauseous and has vomited once every 10 minutes since yesterday night. Vomitus is nonbloody nonbilious. She is unable to keep anything down. She has no complaints of fever, chills, sore throat, productive cough, chest pain, shortness of breath, back pain, abdominal pain, diarrhea, hematochezia, or melena. Her last menstrual period ending 3 days ago. She was seen by a GI specialist 2 days ago and but at that time she has no specific symptoms therefore they mentioned that she will need an abdominal ultrasound sometime next week. She is here because she cannot wait that long. Her mom who is at bedside report that she also had similar symptoms in the past and was found to have an infected gallbladder, requiring cholecystectomy. Patient otherwise without any history of diabetes, and is not a chronic alcohol drinker.  Past Medical History  Diagnosis Date  . Sickle cell trait    History reviewed. No pertinent past surgical history. History reviewed. No pertinent family history. History  Substance Use Topics  . Smoking status: Never Smoker   . Smokeless tobacco: Not on file  . Alcohol Use: No   OB History   Grav Para Term Preterm Abortions TAB SAB Ect Mult Living                 Review of Systems  All other systems reviewed and are  negative.     Allergies  Other and Shellfish allergy  Home Medications   Prior to Admission medications   Medication Sig Start Date End Date Taking? Authorizing Provider  Ascorbic Acid (VITAMIN C PO) Take 1 tablet by mouth daily.    Historical Provider, MD  ibuprofen (ADVIL,MOTRIN) 200 MG tablet Take 1,000 mg by mouth every 6 (six) hours as needed.    Historical Provider, MD  omeprazole (PRILOSEC) 40 MG capsule Take 40 mg by mouth daily.    Historical Provider, MD  ondansetron (ZOFRAN ODT) 4 MG disintegrating tablet 4mg  ODT q4 hours prn nausea/vomit 10/05/13   Loren Raceravid Yelverton, MD   BP 131/85  Pulse 105  Temp(Src) 98.6 F (37 C) (Oral)  Resp 20  SpO2 99%  LMP 11/06/2013 Physical Exam  Nursing note and vitals reviewed. Constitutional: She appears well-developed and well-nourished. No distress.  HENT:  Head: Atraumatic.  Mouth/Throat: Oropharynx is clear and moist.  Eyes: Conjunctivae are normal.  Neck: Neck supple.  Cardiovascular:  Mild tachycardia without murmur rubs or gallop  Pulmonary/Chest: Effort normal and breath sounds normal.  Abdominal: Soft. Bowel sounds are normal. There is no tenderness.  Abdomen is soft, nontender, negative Murphy's sign, no pain at McBurney's point.  Musculoskeletal: She exhibits no edema.  Neurological: She is alert.  Skin: No rash noted.  Psychiatric: She has a normal mood and affect.    ED Course  Procedures (including critical care time)  1:53 PM Patient complaining of recurrent episodes  of nausea and vomiting. She mentioned that she was supposed to get an abdominal ultrasound that is scheduled sometime next week but unable to wait that long given her symptoms. She is currently in no acute distress and has a nonsurgical abdomen. Workup initiated, abdominal ultrasound ordered.  4:51 PM Labs are reassuring. Elevated ketone level and urine which correlates with her nausea and vomiting. Abdominal ultrasound shows no acute abnormalities  and no evidence of biliary disease. Patient's symptoms markedly improved after taking Zofran and now she is able to tolerates by mouth. Will discharge patient with Zofran and [facility]  Return precautions discussed.  Labs Review Labs Reviewed  CBC WITH DIFFERENTIAL - Abnormal; Notable for the following:    RBC 5.29 (*)    Neutrophils Relative % 86 (*)    Lymphocytes Relative 10 (*)    All other components within normal limits  COMPREHENSIVE METABOLIC PANEL - Abnormal; Notable for the following:    Total Protein 8.5 (*)    Anion gap 18 (*)    All other components within normal limits  URINALYSIS, ROUTINE W REFLEX MICROSCOPIC - Abnormal; Notable for the following:    Specific Gravity, Urine 1.032 (*)    Bilirubin Urine SMALL (*)    Ketones, ur >80 (*)    All other components within normal limits  LIPASE, BLOOD  POC URINE PREG, ED    Imaging Review [place] Abdomen Complete  11/08/2013   CLINICAL DATA:  Nausea and vomiting ; suspect biliary abnormality  EXAM: ULTRASOUND ABDOMEN COMPLETE  COMPARISON:  None.  FINDINGS: Gallbladder:  No gallstones or wall thickening visualized. No sonographic Murphy sign noted.  Common bile duct:  Diameter: 1.8 mm  Liver:  No focal lesion identified. Within normal limits in parenchymal echogenicity.  IVC:  No abnormality visualized.  Pancreas:  Visualized portion unremarkable.  Spleen:  Size and appearance within normal limits.  Right Kidney:  Length: 10 cm. Echogenicity within normal limits. No mass or hydronephrosis visualized.  Left Kidney:  Length: 10.2 cm. Echogenicity within normal limits. No mass or hydronephrosis visualized.  Abdominal aorta:  No aneurysm visualized.  Other findings:  No ascites demonstrated.  IMPRESSION: Normal abdominal ultrasound examination.   Electronically Signed   By: David  Swazil[place]   On: 11/08/2013 15:05     EKG Interpretation None      MDM   Final diagnoses:  Non-intractable cyclical  vomiting with nausea    BP 133/88  Pulse 81  Temp(Src) 98.6 F (37 C) (Oral)  Resp 20  SpO2 100%  LMP 11/06/2013  I have reviewed nursing notes and vital signs. I personally reviewed the imaging tests through PACS system  I reviewed available ER/hospitalization records thought the EMR     Fayrene HelperBowie Sehaj Mcenroe, [place] JerseyPA-C 11/08/13 1652

## 2013-11-08 NOTE — ED Notes (Signed)
Patient transported to Ultrasound 

## 2013-11-09 NOTE — ED Provider Notes (Signed)
Medical screening examination/treatment/procedure(s) were conducted as a shared visit with non-physician practitioner(s) and myself.  I personally evaluated the patient during the encounter.   EKG Interpretation None      I interviewed and examined the patient. Lungs are CTAB. Cardiac exam wnl. Abdomen soft.  Pt here w/ ongoing N/V. No abd pain. Planning for US by GI. Will go ahead and order.   US unremarkable. Will d/c and rec GI f/u.   Junius ArgyleForrest S Harmonii Karle, MD 11/09/13 [phone]

## 2013-11-13 ENCOUNTER — Ambulatory Visit (HOSPITAL_COMMUNITY): Payer: 59

## 2013-11-14 ENCOUNTER — Ambulatory Visit (HOSPITAL_COMMUNITY)
Admission: RE | Admit: 2013-11-14 | Discharge: 2013-11-14 | Disposition: A | Discharge status: Home / Self Care | Payer: 59 | Source: Ambulatory Visit | Attending: Gastroenterology | Admitting: Gastroenterology

## 2013-11-14 DIAGNOSIS — R1013 Epigastric pain: Secondary | ICD-10-CM | POA: Insufficient documentation

## 2013-11-29 ENCOUNTER — Emergency Department (HOSPITAL_COMMUNITY)
Admission: EM | Admit: 2013-11-29 | Discharge: 2013-11-30 | Disposition: A | Discharge status: Home / Self Care | Payer: 59 | Attending: Emergency Medicine | Admitting: Emergency Medicine

## 2013-11-29 ENCOUNTER — Encounter (HOSPITAL_COMMUNITY): Payer: Self-pay | Admitting: Emergency Medicine

## 2013-11-29 DIAGNOSIS — D573 Sickle-cell trait: Secondary | ICD-10-CM | POA: Insufficient documentation

## 2013-11-29 DIAGNOSIS — R630 Anorexia: Secondary | ICD-10-CM | POA: Insufficient documentation

## 2013-11-29 DIAGNOSIS — Z79899 Other long term (current) drug therapy: Secondary | ICD-10-CM | POA: Insufficient documentation

## 2013-11-29 DIAGNOSIS — R112 Nausea with vomiting, unspecified: Secondary | ICD-10-CM | POA: Insufficient documentation

## 2013-11-29 DIAGNOSIS — R1013 Epigastric pain: Secondary | ICD-10-CM | POA: Insufficient documentation

## 2013-11-29 NOTE — ED Notes (Signed)
Pt arrived to the ED with a complaint of emesis.   Pt states this has been a reoccurring condition for over a year.  Pt states she ran out of zofran and has had 5 episodes of small emesis since that time.

## 2013-11-30 LAB — COMPREHENSIVE METABOLIC PANEL
ALK PHOS: 45 U/L (ref 39–117)
ALT: 11 U/L (ref 0–35)
AST: 19 U/L (ref 0–37)
Albumin: 4.2 g/dL (ref 3.5–5.2)
Anion gap: 14 (ref 5–15)
BUN: 11 mg/dL (ref 6–23)
CALCIUM: 9.8 mg/dL (ref 8.4–10.5)
CO2: 23 mEq/L (ref 19–32)
Chloride: 100 mEq/L (ref 96–112)
Creatinine, Ser: 0.75 mg/dL (ref 0.50–1.10)
GFR calc Af Amer: >90 mL/min (ref >90)
Glucose, Bld: 94 mg/dL (ref 70–99)
Potassium: 3.6 mEq/L — ABNORMAL LOW (ref 3.7–5.3)
Sodium: 137 mEq/L (ref 137–147)
Total Bilirubin: 0.4 mg/dL (ref 0.3–1.2)
Total Protein: 8.2 g/dL (ref 6.0–8.3)

## 2013-11-30 LAB — CBC WITH DIFFERENTIAL/PLATELET
Basophils Absolute: 0 10*3/uL (ref 0.0–0.1)
Basophils Relative: 0 % (ref 0–1)
EOS ABS: 0 10*3/uL (ref 0.0–0.7)
EOS PCT: 0 % (ref 0–5)
HCT: 39.3 % (ref 36.0–46.0)
HEMOGLOBIN: 13.5 g/dL (ref 12.0–15.0)
Lymphocytes Relative: 13 % (ref 12–46)
Lymphs Abs: 1.1 10*3/uL (ref 0.7–4.0)
MCH: 27.3 pg (ref 26.0–34.0)
MCHC: 34.4 g/dL (ref 30.0–36.0)
MCV: 79.6 fL (ref 78.0–100.0)
MONOS PCT: 3 % (ref 3–12)
Monocytes Absolute: 0.3 10*3/uL (ref 0.1–1.0)
Neutro Abs: 6.9 10*3/uL (ref 1.7–7.7)
Neutrophils Relative %: 84 % — ABNORMAL HIGH (ref 43–77)
PLATELETS: 263 10*3/uL (ref 150–400)
RBC: 4.94 MIL/uL (ref 3.87–5.11)
RDW: 12.4 % (ref 11.5–15.5)
WBC: 8.2 10*3/uL (ref 4.0–10.5)

## 2013-11-30 LAB — URINALYSIS, ROUTINE W REFLEX MICROSCOPIC
Bilirubin Urine: NEGATIVE
GLUCOSE, UA: NEGATIVE mg/dL
Hgb urine dipstick: NEGATIVE
Ketones, ur: >80 mg/dL — AB
Leukocytes, UA: NEGATIVE
Nitrite: NEGATIVE
PH: 6 (ref 5.0–8.0)
Protein, ur: NEGATIVE mg/dL
Specific Gravity, Urine: 1.028 (ref 1.005–1.030)
UROBILINOGEN UA: 1 mg/dL (ref 0.0–1.0)

## 2013-11-30 LAB — LIPASE, BLOOD: Lipase: 20 U/L (ref 11–59)

## 2013-11-30 LAB — PREGNANCY, URINE: PREG TEST UR: NEGATIVE

## 2013-11-30 MED ORDER — SODIUM CHLORIDE 0.9 % IV BOLUS (SEPSIS)
1000.0000 mL | Freq: Once | INTRAVENOUS | Status: AC
  Administered 2013-11-30: 1000 mL via INTRAVENOUS

## 2013-11-30 MED ORDER — ONDANSETRON 4 MG PO TBDP
4.0000 mg | ORAL_TABLET | Freq: Once | ORAL | Status: AC
  Administered 2013-11-30: 4 mg via ORAL
  Filled 2013-11-30: qty 1

## 2013-11-30 MED ORDER — OMEPRAZOLE 20 MG PO CPDR: 20.0000 mg | DELAYED_RELEASE_CAPSULE | Freq: Every day | ORAL | Status: DC

## 2013-11-30 MED ORDER — ONDANSETRON 4 MG PO TBDP: ORAL_TABLET | ORAL | Status: DC

## 2013-11-30 MED ORDER — METOCLOPRAMIDE HCL 5 MG/ML IJ SOLN
10.0000 mg | Freq: Once | INTRAMUSCULAR | Status: AC
  Administered 2013-11-30: 10 mg via INTRAVENOUS
  Filled 2013-11-30: qty 2

## 2013-11-30 NOTE — ED Provider Notes (Signed)
CSN: [id number]     Arrival date & time 11/29/13  2323 History   First MD Initiated Contact with Patient 11/29/13 2350     Chief Complaint  Patient presents with  . Emesis     (Consider location/radiation/quality/duration/timing/severity/associated sxs/prior Treatment) HPI Comments: 23 year old female with sickle cell trait presents with recurrent nausea and vomiting for almost a year now. Patient has been seen by primary doctor, GI specialist, had ultrasound of her abdomen and no known cause at this time. Patient denies illegal drugs or marijuana use. No specific association with any foods sometimes it occurs completely randomly. Nonbloody vomiting. Patient not currently pregnant. Nothing improves except for Zofran.  Patient is a 23 y.o. female presenting with vomiting. The history is provided by the patient.  Emesis Associated symptoms: abdominal pain (epigastric)   Associated symptoms: no chills and no headaches     Past Medical History  Diagnosis Date  . Sickle cell trait    History reviewed. No pertinent past surgical history. History reviewed. No pertinent family history. History  Substance Use Topics  . Smoking status: Never Smoker   . Smokeless tobacco: Not on file  . Alcohol Use: No   OB History   Grav Para Term Preterm Abortions TAB SAB Ect Mult Living                 Review of Systems  Constitutional: Positive for appetite change. Negative for fever and chills.  HENT: Negative for congestion.   Eyes: Negative for visual disturbance.  Respiratory: Negative for shortness of breath.   Cardiovascular: Negative for chest pain.  Gastrointestinal: Positive for nausea, vomiting and abdominal pain (epigastric).  Genitourinary: Negative for dysuria and flank pain.  Musculoskeletal: Negative for back pain, neck pain and neck stiffness.  Skin: Negative for rash.  Neurological: Negative for light-headedness and headaches.      Allergies  Other and Shellfish  allergy  Home Medications   Prior to Admission medications   Medication Sig Start Date End Date Taking? Authorizing Provider  norethindrone-ethinyl estradiol (JUNEL FE,GILDESS FE,LOESTRIN FE) 1-20 MG-MCG tablet Take 1 tablet by mouth daily.   Yes Historical Provider, MD  omeprazole (PRILOSEC) 20 MG capsule Take 1 capsule (20 mg total) by mouth daily. 11/30/13   Enid SkeensJoshua M Marrie Chandra, MD  ondansetron (ZOFRAN ODT) 4 MG disintegrating tablet 4mg  ODT q4 hours prn nausea/vomit 11/30/13   Enid SkeensJoshua M Burwell Bethel, MD   BP 113/81  Pulse 81  Temp(Src) 98.3 F (36.8 C) (Oral)  Resp 18  SpO2 100%  LMP 11/04/2013 Physical Exam  Nursing note and vitals reviewed. Constitutional: She is oriented to person, place, and time. She appears well-developed and well-nourished.  HENT:  Head: Normocephalic and atraumatic.  Mild dry mucous membranes  Eyes: Conjunctivae are normal. Right eye exhibits no discharge. Left eye exhibits no discharge.  Neck: Normal range of motion. Neck supple. No tracheal deviation present.  Cardiovascular: Normal rate and regular rhythm.   Pulmonary/Chest: Effort normal and breath sounds normal.  Abdominal: Soft. She exhibits no distension. There is no tenderness. There is no guarding.  Musculoskeletal: She exhibits no edema.  Neurological: She is alert and oriented to person, place, and time.  Skin: Skin is warm. No rash noted.  Psychiatric: She has a normal mood and affect.    ED Course  Procedures (including critical care time) Emergency Ultrasound Study:   Angiocath insertion Performed by: Enid SkeensZAVITZ, Simone Rodenbeck M  Consent: Verbal consent obtained. Risks and benefits: risks, benefits and alternatives were discussed Immediately  prior to procedure the correct patient, procedure, equipment, support staff and site/side marked as needed.  Indication: difficult IV access Preparation: Patient was prepped and draped in the usual sterile fashion. Vein Location: left ac vein was visualized during  assessment for potential access sites and was found to be patent/ easily compressed with linear ultrasound.  The needle was visualized with real-time ultrasound and guided into the vein. Gauge: 20 g  Image saved and stored.  Normal blood return.  Patient tolerance: Patient tolerated the procedure well with no immediate complications.     Labs Review Labs Reviewed  URINALYSIS, ROUTINE W REFLEX MICROSCOPIC - Abnormal; Notable for the following:    Ketones, ur >80 (*)    All other components within normal limits  COMPREHENSIVE METABOLIC PANEL - Abnormal; Notable for the following:    Potassium 3.6 (*)    All other components within normal limits  CBC WITH DIFFERENTIAL - Abnormal; Notable for the following:    Neutrophils Relative % 84 (*)    All other components within normal limits  PREGNANCY, URINE  LIPASE, BLOOD    Imaging Review No results found.   EKG Interpretation None      MDM   Final diagnoses:  Non-intractable vomiting with nausea, vomiting of unspecified type   Well-appearing healthy female with recurrent nausea and vomiting. Urinalysis showed mild dehydration and patient not pregnant. Oral fluid challenge patient vomited. Difficult IV an ultrasound-guided IV by myself. IV fluids and blood work given, patient proves significantly on recheck and blood work unremarkable. Discussed trial of PPI and followup with gastroenterology to discuss EGD and further evaluation.  Results and differential diagnosis were discussed with the patient/parent/guardian. Close follow up outpatient was discussed, comfortable with the plan.   Medications  ondansetron (ZOFRAN-ODT) disintegrating tablet 4 mg (4 mg Oral Given 11/30/13 0059)  sodium chloride 0.9 % bolus 1,000 mL (1,000 mLs Intravenous New Bag/Given 11/30/13 0313)  metoCLOPramide (REGLAN) injection 10 mg (10 mg Intravenous Given 11/30/13 0314)    Filed Vitals:   11/29/13 2329 11/30/13 0311  BP: 123/78 113/81  Pulse: 91 81   Temp: 98.6 F (37 C) 98.3 F (36.8 C)  TempSrc: Oral Oral  Resp: 16 18  SpO2: 100% 100%         Enid Skeens, MD 11/30/13 [phone]

## 2013-11-30 NOTE — Discharge Instructions (Signed)
If you were given medicines take as directed.  If you are on coumadin or contraceptives realize their levels and effectiveness is altered by many different medicines.  If you have any reaction (rash, tongues swelling, other) to the medicines stop taking and see a physician.    Please follow up as directed and return to the ER or see a physician for new or worsening symptoms.  Thank you. Filed Vitals:   11/29/13 2329 11/30/13 0311  BP: 123/78 113/81  Pulse: 91 81  Temp: 98.6 F (37 C) 98.3 F (36.8 C)  TempSrc: Oral Oral  Resp: 16 18  SpO2: 100% 100%

## 2013-12-10 ENCOUNTER — Other Ambulatory Visit (HOSPITAL_COMMUNITY): Payer: Self-pay | Admitting: Gastroenterology

## 2013-12-10 DIAGNOSIS — R1110 Vomiting, unspecified: Secondary | ICD-10-CM

## 2013-12-13 ENCOUNTER — Ambulatory Visit (HOSPITAL_COMMUNITY)
Admission: RE | Admit: 2013-12-13 | Discharge: 2013-12-13 | Disposition: A | Discharge status: Home / Self Care | Payer: 59 | Source: Ambulatory Visit | Attending: Diagnostic Radiology | Admitting: Diagnostic Radiology

## 2013-12-13 DIAGNOSIS — R1013 Epigastric pain: Principal | ICD-10-CM

## 2013-12-13 DIAGNOSIS — K3189 Other diseases of stomach and duodenum: Secondary | ICD-10-CM | POA: Diagnosis not present

## 2013-12-13 DIAGNOSIS — R112 Nausea with vomiting, unspecified: Secondary | ICD-10-CM | POA: Diagnosis present

## 2013-12-13 DIAGNOSIS — R1110 Vomiting, unspecified: Secondary | ICD-10-CM

## 2013-12-13 MED ORDER — TECHNETIUM TC 99M SULFUR COLLOID
2.2000 | Freq: Once | INTRAVENOUS | Status: AC | PRN
  Administered 2013-12-13: 2.2 via INTRAVENOUS

## 2014-01-23 ENCOUNTER — Encounter (HOSPITAL_COMMUNITY): Payer: Self-pay | Admitting: Emergency Medicine

## 2014-01-23 ENCOUNTER — Emergency Department (HOSPITAL_COMMUNITY)
Admission: EM | Admit: 2014-01-23 | Discharge: 2014-01-23 | Disposition: A | Discharge status: Home / Self Care | Payer: 59 | Attending: Emergency Medicine | Admitting: Emergency Medicine

## 2014-01-23 DIAGNOSIS — Z862 Personal history of diseases of the blood and blood-forming organs and certain disorders involving the immune mechanism: Secondary | ICD-10-CM | POA: Diagnosis not present

## 2014-01-23 DIAGNOSIS — R112 Nausea with vomiting, unspecified: Secondary | ICD-10-CM | POA: Diagnosis present

## 2014-01-23 DIAGNOSIS — R0982 Postnasal drip: Secondary | ICD-10-CM | POA: Diagnosis not present

## 2014-01-23 DIAGNOSIS — R1115 Cyclical vomiting syndrome unrelated to migraine: Secondary | ICD-10-CM | POA: Insufficient documentation

## 2014-01-23 DIAGNOSIS — Z79899 Other long term (current) drug therapy: Secondary | ICD-10-CM | POA: Insufficient documentation

## 2014-01-23 DIAGNOSIS — Z3202 Encounter for pregnancy test, result negative: Secondary | ICD-10-CM | POA: Insufficient documentation

## 2014-01-23 LAB — POC URINE PREG, ED: Preg Test, Ur: NEGATIVE

## 2014-01-23 LAB — COMPREHENSIVE METABOLIC PANEL
ALBUMIN: 3.7 g/dL (ref 3.5–5.2)
ALT: 8 U/L (ref 0–35)
AST: 19 U/L (ref 0–37)
Alkaline Phosphatase: 39 U/L (ref 39–117)
Anion gap: 12 (ref 5–15)
BUN: 8 mg/dL (ref 6–23)
CALCIUM: 9.6 mg/dL (ref 8.4–10.5)
CO2: 22 mEq/L (ref 19–32)
CREATININE: 0.83 mg/dL (ref 0.50–1.10)
Chloride: 107 mEq/L (ref 96–112)
GFR calc Af Amer: >90 mL/min (ref >90)
Glucose, Bld: 89 mg/dL (ref 70–99)
Potassium: 4.3 mEq/L (ref 3.7–5.3)
Sodium: 141 mEq/L (ref 137–147)
Total Bilirubin: 0.5 mg/dL (ref 0.3–1.2)
Total Protein: 7.4 g/dL (ref 6.0–8.3)

## 2014-01-23 LAB — CBC WITH DIFFERENTIAL/PLATELET
BASOS ABS: 0 10*3/uL (ref 0.0–0.1)
BASOS PCT: 0 % (ref 0–1)
EOS PCT: 2 % (ref 0–5)
Eosinophils Absolute: 0.1 10*3/uL (ref 0.0–0.7)
HCT: 38.9 % (ref 36.0–46.0)
Hemoglobin: 13.3 g/dL (ref 12.0–15.0)
Lymphocytes Relative: 20 % (ref 12–46)
Lymphs Abs: 1.1 10*3/uL (ref 0.7–4.0)
MCH: 27.2 pg (ref 26.0–34.0)
MCHC: 34.2 g/dL (ref 30.0–36.0)
MCV: 79.6 fL (ref 78.0–100.0)
MONO ABS: 0.4 10*3/uL (ref 0.1–1.0)
Monocytes Relative: 8 % (ref 3–12)
Neutro Abs: 3.9 10*3/uL (ref 1.7–7.7)
Neutrophils Relative %: 70 % (ref 43–77)
Platelets: 245 10*3/uL (ref 150–400)
RBC: 4.89 MIL/uL (ref 3.87–5.11)
RDW: 12.9 % (ref 11.5–15.5)
WBC: 5.5 10*3/uL (ref 4.0–10.5)

## 2014-01-23 MED ORDER — OMEPRAZOLE 20 MG PO CPDR: 20.0000 mg | DELAYED_RELEASE_CAPSULE | Freq: Every day | ORAL | Status: DC

## 2014-01-23 MED ORDER — ONDANSETRON HCL 4 MG/2ML IJ SOLN
4.0000 mg | Freq: Once | INTRAMUSCULAR | Status: AC
Start: 2014-01-23 — End: 2014-01-23
  Administered 2014-01-23: 4 mg via INTRAVENOUS
  Filled 2014-01-23: qty 2

## 2014-01-23 MED ORDER — ONDANSETRON 4 MG PO TBDP: 4.0000 mg | ORAL_TABLET | Freq: Three times a day (TID) | ORAL | Status: DC | PRN

## 2014-01-23 MED ORDER — GI COCKTAIL ~~LOC~~
30.0000 mL | Freq: Once | ORAL | Status: AC
  Administered 2014-01-23: 30 mL via ORAL
  Filled 2014-01-23: qty 30

## 2014-01-23 NOTE — ED Provider Notes (Signed)
CSN: [id number]     Arrival date & time 01/23/14  0930 History   First MD Initiated Contact with Patient 01/23/14 1036     Chief Complaint  Patient presents with  . Emesis     (Consider location/radiation/quality/duration/timing/severity/associated sxs/prior Treatment) Patient is a 23 y.o. female presenting with vomiting.  Emesis Associated symptoms: no abdominal pain, no chills and no diarrhea    Pt is a 23 y/o female w/ PMHx of sickle cell trait who presents to the ED w/ nausea and vomiting x 1 day. She takes reglan  QID for gastroparesis which has not helped w/ n/v. Vomiting started last night and occurred every hour including throughout the night. She woke up and vomited once this morning. That was the last time she has vomited since then. Denies blood in vomit, abdominal pain, fevers, NS, chills, diarrhea, new food, and illicit drug use. She does not drink alcohol.  Past Medical History  Diagnosis Date  . Sickle cell trait    History reviewed. No pertinent past surgical history. No family history on file. History  Substance Use Topics  . Smoking status: Never Smoker   . Smokeless tobacco: Not on file  . Alcohol Use: No   OB History   Grav Para Term Preterm Abortions TAB SAB Ect Mult Living                 Review of Systems  Constitutional: Negative for fever and chills.  Respiratory: Negative for shortness of breath.   Cardiovascular: Negative for chest pain.  Gastrointestinal: Positive for nausea and vomiting. Negative for abdominal pain and diarrhea.      Allergies  Other and Shellfish allergy  Home Medications   Prior to Admission medications   Medication Sig Start Date End Date Taking? Authorizing Provider  metoCLOPramide (REGLAN) 5 MG tablet Take 5 mg by mouth 4 (four) times daily.   Yes Historical Provider, MD  norethindrone-ethinyl estradiol (JUNEL FE,GILDESS FE,LOESTRIN FE) 1-20 MG-MCG tablet Take 1 tablet by mouth daily.   Yes Historical Provider, MD   ondansetron (ZOFRAN ODT) 4 MG disintegrating tablet  ODT q4 hours prn nausea/vomit 11/30/13  Yes Enid Skeens, MD   BP 136/83  Pulse 77  Temp(Src) 98.3 F (36.8 C) (Oral)  Resp 13  SpO2 94%  LMP 01/17/2014 Physical Exam  Constitutional: She appears well-developed and well-nourished. No distress.  HENT:  Moist mucous membranes, post nasal drip   Cardiovascular: Normal rate and regular rhythm.   No murmur heard. Pulmonary/Chest: Effort normal and breath sounds normal. She has no wheezes.  Abdominal: Soft. Bowel sounds are normal. She exhibits no distension. There is no tenderness.  Musculoskeletal: She exhibits no edema.  Skin: Skin is warm and dry.    ED Course  Procedures (including critical care time) Labs Review Labs Reviewed  CBC WITH DIFFERENTIAL  COMPREHENSIVE METABOLIC PANEL  URINALYSIS, ROUTINE W REFLEX MICROSCOPIC  POC URINE PREG, ED     MDM   Final diagnoses:  Non-intractable cyclical vomiting with nausea    Pt presents to the ED w/ nausea and vomiting x 1 day. She has had a full GI workup that was negative except for a slight delay in gastric emptying and mild GERD. N/V has been controlled w/ reglan up until last night. Will give IV zofran and GI cocktail.   Pt feels significantly better after IV zofran and GI cocktail. She has not been on any acid reflex medications recently. Stable for d/c home w/ prilosec  once  a day and zofran q4 hours for n/v.

## 2014-01-23 NOTE — Discharge Instructions (Signed)
Gastroesophageal Reflux Disease, Adult Gastroesophageal reflux disease (GERD) happens when acid from your stomach flows up into the esophagus. When acid comes in contact with the esophagus, the acid causes soreness (inflammation) in the esophagus. Over time, GERD may create small holes (ulcers) in the lining of the esophagus. CAUSES   Increased body weight. This puts pressure on the stomach, making acid rise from the stomach into the esophagus.  Smoking. This increases acid production in the stomach.  Drinking alcohol. This causes decreased pressure in the lower esophageal sphincter (valve or ring of muscle between the esophagus and stomach), allowing acid from the stomach into the esophagus.  Late evening meals and a full stomach. This increases pressure and acid production in the stomach.  A malformed lower esophageal sphincter. Sometimes, no cause is found. SYMPTOMS   Burning pain in the lower part of the mid-chest behind the breastbone and in the mid-stomach area. This may occur twice a week or more often.  Trouble swallowing.  Sore throat.  Dry cough.  Asthma-like symptoms including chest tightness, shortness of breath, or wheezing. DIAGNOSIS  Your caregiver may be able to diagnose GERD based on your symptoms. In some cases, X-rays and other tests may be done to check for complications or to check the condition of your stomach and esophagus. TREATMENT  Your caregiver may recommend over-the-counter or prescription medicines to help decrease acid production. Ask your caregiver before starting or adding any new medicines.  HOME CARE INSTRUCTIONS   Change the factors that you can control. Ask your caregiver for guidance concerning weight loss, quitting smoking, and alcohol consumption.  Avoid foods and drinks that make your symptoms worse, such as:  Caffeine or alcoholic drinks.  Chocolate.  Peppermint or mint flavorings.  Garlic and onions.  Spicy foods.  Citrus fruits,  such as oranges, lemons, or limes.  Tomato-based foods such as sauce, chili, salsa, and pizza.  Fried and fatty foods.  Avoid lying down for the 3 hours prior to your bedtime or prior to taking a nap.  Eat small, frequent meals instead of large meals.  Wear loose-fitting clothing. Do not wear anything tight around your waist that causes pressure on your stomach.  Raise the head of your bed 6 to 8 inches with wood blocks to help you sleep. Extra pillows will not help.  Only take over-the-counter or prescription medicines for pain, discomfort, or fever as directed by your caregiver.  Do not take aspirin, ibuprofen, or other nonsteroidal anti-inflammatory drugs (NSAIDs). SEEK IMMEDIATE MEDICAL CARE IF:   You have pain in your arms, neck, jaw, teeth, or back.  Your pain increases or changes in intensity or duration.  You develop nausea, vomiting, or sweating (diaphoresis).  You develop shortness of breath, or you faint.  Your vomit is green, yellow, black, or looks like coffee grounds or blood.  Your stool is red, bloody, or black. These symptoms could be signs of other problems, such as heart disease, gastric bleeding, or esophageal bleeding. MAKE SURE YOU:   Understand these instructions.  Will watch your condition.  Will get help right away if you are not doing well or get worse. Document Released: 02/02/2005 Document Revised: 07/18/2011 Document Reviewed: 11/12/2010 Exit[employer] Patient Information 2015 [employer]. This information is not intended to replace advice given to you by your health care provider. Make sure you discuss any questions you have with your health care provider.  

## 2014-01-23 NOTE — ED Notes (Signed)
Patient verbalized understanding of discharge instructions. Patient is stable at discharge. 

## 2014-01-23 NOTE — ED Notes (Signed)
Per pt, treated for vomiting on and off for over a year-states her stomach does not digest food properly-takes med but thinks it is no longer effective-started vomiting last night

## 2014-02-01 NOTE — ED Provider Notes (Signed)
I saw and evaluated the patient, reviewed the resident's note and I agree with the findings and plan.  Pt with hx gastroparesis c/o nv. Hx same. abd soft nt. Ivf, rx.    Suzi Roots, MD 02/01/14 2142

## 2014-03-17 ENCOUNTER — Other Ambulatory Visit: Payer: Self-pay | Admitting: Gastroenterology

## 2014-04-14 ENCOUNTER — Emergency Department (HOSPITAL_COMMUNITY)
Admission: EM | Admit: 2014-04-14 | Discharge: 2014-04-15 | Disposition: A | Discharge status: Home / Self Care | Payer: 59 | Attending: Emergency Medicine | Admitting: Emergency Medicine

## 2014-04-14 ENCOUNTER — Encounter (HOSPITAL_COMMUNITY): Payer: Self-pay | Admitting: Emergency Medicine

## 2014-04-14 DIAGNOSIS — R112 Nausea with vomiting, unspecified: Secondary | ICD-10-CM | POA: Insufficient documentation

## 2014-04-14 DIAGNOSIS — Z862 Personal history of diseases of the blood and blood-forming organs and certain disorders involving the immune mechanism: Secondary | ICD-10-CM | POA: Insufficient documentation

## 2014-04-14 DIAGNOSIS — Z3202 Encounter for pregnancy test, result negative: Secondary | ICD-10-CM | POA: Insufficient documentation

## 2014-04-14 DIAGNOSIS — Z79899 Other long term (current) drug therapy: Secondary | ICD-10-CM | POA: Insufficient documentation

## 2014-04-14 LAB — COMPREHENSIVE METABOLIC PANEL
ALT: 24 U/L (ref 0–35)
AST: 26 U/L (ref 0–37)
Albumin: 4.2 g/dL (ref 3.5–5.2)
Alkaline Phosphatase: 53 U/L (ref 39–117)
Anion gap: 17 — ABNORMAL HIGH (ref 5–15)
BILIRUBIN TOTAL: 0.4 mg/dL (ref 0.3–1.2)
BUN: 14 mg/dL (ref 6–23)
CHLORIDE: 101 meq/L (ref 96–112)
CO2: 22 meq/L (ref 19–32)
CREATININE: 0.78 mg/dL (ref 0.50–1.10)
Calcium: 10.1 mg/dL (ref 8.4–10.5)
GFR calc Af Amer: >90 mL/min (ref >90)
GFR calc non Af Amer: >90 mL/min (ref >90)
Glucose, Bld: 106 mg/dL — ABNORMAL HIGH (ref 70–99)
POTASSIUM: 3.6 meq/L — AB (ref 3.7–5.3)
SODIUM: 140 meq/L (ref 137–147)
Total Protein: 8.3 g/dL (ref 6.0–8.3)

## 2014-04-14 LAB — CBC WITH DIFFERENTIAL/PLATELET
BASOS ABS: 0 10*3/uL (ref 0.0–0.1)
Basophils Relative: 0 % (ref 0–1)
Eosinophils Absolute: 0 10*3/uL (ref 0.0–0.7)
Eosinophils Relative: 0 % (ref 0–5)
HEMATOCRIT: 40.6 % (ref 36.0–46.0)
Hemoglobin: 13.4 g/dL (ref 12.0–15.0)
Lymphocytes Relative: 12 % (ref 12–46)
Lymphs Abs: 0.6 10*3/uL — ABNORMAL LOW (ref 0.7–4.0)
MCH: 26.6 pg (ref 26.0–34.0)
MCHC: 33 g/dL (ref 30.0–36.0)
MCV: 80.7 fL (ref 78.0–100.0)
MONO ABS: 0.5 10*3/uL (ref 0.1–1.0)
Monocytes Relative: 10 % (ref 3–12)
Neutro Abs: 3.9 10*3/uL (ref 1.7–7.7)
Neutrophils Relative %: 78 % — ABNORMAL HIGH (ref 43–77)
Platelets: 236 10*3/uL (ref 150–400)
RBC: 5.03 MIL/uL (ref 3.87–5.11)
RDW: 13.1 % (ref 11.5–15.5)
WBC: 5.1 10*3/uL (ref 4.0–10.5)

## 2014-04-14 LAB — LIPASE, BLOOD: LIPASE: 15 U/L (ref 11–59)

## 2014-04-14 MED ORDER — ONDANSETRON HCL 4 MG/2ML IJ SOLN
4.0000 mg | Freq: Once | INTRAMUSCULAR | Status: AC
  Administered 2014-04-14: 4 mg via INTRAVENOUS
  Filled 2014-04-14: qty 2

## 2014-04-14 NOTE — ED Provider Notes (Signed)
CSN: [id number]     Arrival date & time 04/14/14  2034 History   First MD Initiated Contact with Patient 04/14/14 2256     Chief Complaint  Patient presents with  . Emesis     (Consider location/radiation/quality/duration/timing/severity/associated sxs/prior Treatment) HPI   Elizabeth Fisher is a 23 y.o. female with a past medical history coming in with vomiting. Patient states this is going on for the past year, she has seen gastroenterology without any confirmed diagnosis. Her current episode has been going on for the past 2 days.  She vomits greater than 5 times per day. She has no abdominal pain or diarrhea. She states this randomly occurs and cannot describe what is associated with. She takes Zofran at home with no relief but states IV Zofran usually worse when she presents emergency department. She is otherwise been in her normal state of health, she denies any fevers or recent infections. Ultrasound and upper GI series and endoscopy have all been normal during this period. Patient believes she may have an allergy to food. She has no further complaints.  10 Systems reviewed and are negative for acute change except as noted in the HPI.     Past Medical History  Diagnosis Date  . Sickle cell trait    History reviewed. No pertinent past surgical history. History reviewed. No pertinent family history. History  Substance Use Topics  . Smoking status: Never Smoker   . Smokeless tobacco: Not on file  . Alcohol Use: No   OB History    No data available     Review of Systems    Allergies  Other and Shellfish allergy  Home Medications   Prior to Admission medications   Medication Sig Start Date End Date Taking? Authorizing Provider  metoCLOPramide (REGLAN) 5 MG tablet Take 5 mg by mouth 4 (four) times daily.   Yes Historical Provider, MD  norethindrone-ethinyl estradiol (JUNEL FE,GILDESS FE,LOESTRIN FE) 1-20 MG-MCG tablet Take 1 tablet by mouth daily.   Yes Historical  Provider, MD  omeprazole (PRILOSEC) 20 MG capsule Take 1 capsule (20 mg total) by mouth daily. 01/23/14  Yes Gara Kroneriana Truong, MD  ondansetron (ZOFRAN ODT) 4 MG disintegrating tablet Take 1 tablet (4 mg total) by mouth every 8 (eight) hours as needed for nausea or vomiting. 01/23/14  Yes Gara Kroneriana Truong, MD  ondansetron (ZOFRAN ODT) 4 MG disintegrating tablet 4mg  ODT q4 hours prn nausea/vomit Patient not taking: Reported on 04/14/2014 11/30/13   Enid SkeensJoshua M Zavitz, MD   BP 133/77 mmHg  Pulse 85  Temp(Src) 98.1 F (36.7 C) (Oral)  Resp 16  SpO2 97%  LMP 04/11/2014 (Approximate) Physical Exam  Constitutional: She is oriented to person, place, and time. She appears well-developed and well-nourished. No distress.  HENT:  Head: Normocephalic and atraumatic.  Nose: Nose normal.  Mouth/Throat: Oropharynx is clear and moist. No oropharyngeal exudate.  Eyes: Conjunctivae and EOM are normal. Pupils are equal, round, and reactive to light. No scleral icterus.  Neck: Normal range of motion. Neck supple. No JVD present. No tracheal deviation present. No thyromegaly present.  Cardiovascular: Normal rate, regular rhythm and normal heart sounds.  Exam reveals no gallop and no friction rub.   No murmur heard. Pulmonary/Chest: Effort normal and breath sounds normal. No respiratory distress. She has no wheezes. She exhibits no tenderness.  Abdominal: Soft. Bowel sounds are normal. She exhibits no distension and no mass. There is no tenderness. There is no rebound and no guarding.  Musculoskeletal: Normal range  of motion. She exhibits no edema or tenderness.  Lymphadenopathy:    She has no cervical adenopathy.  Neurological: She is alert and oriented to person, place, and time. No cranial nerve deficit. She exhibits normal muscle tone.  Skin: Skin is warm and dry. No rash noted. She is not diaphoretic. No erythema. No pallor.  Nursing note and vitals reviewed.   ED Course  Procedures (including critical care  time) Labs Review Labs Reviewed  CBC WITH DIFFERENTIAL - Abnormal; Notable for the following:    Neutrophils Relative % 78 (*)    Lymphs Abs 0.6 (*)    All other components within normal limits  COMPREHENSIVE METABOLIC PANEL - Abnormal; Notable for the following:    Potassium 3.6 (*)    Glucose, Bld 106 (*)    Anion gap 17 (*)    All other components within normal limits  URINALYSIS, ROUTINE W REFLEX MICROSCOPIC - Abnormal; Notable for the following:    Color, Urine AMBER (*)    APPearance CLOUDY (*)    Specific Gravity, Urine 1.034 (*)    Hgb urine dipstick MODERATE (*)    Bilirubin Urine SMALL (*)    Ketones, ur 40 (*)    Protein, ur 30 (*)    Leukocytes, UA SMALL (*)    All other components within normal limits  URINE MICROSCOPIC-ADD ON - Abnormal; Notable for the following:    Squamous Epithelial / LPF FEW (*)    Bacteria, UA FEW (*)    All other components within normal limits  LIPASE, BLOOD  PREGNANCY, URINE    Imaging Review No results found.   EKG Interpretation None      MDM   Final diagnoses:  None    Patient presents to the emergency department for isolated vomiting and nausea.  This is been going on for the past year without any confirmed diagnosis. Will evaluate with laboratory studies and urinalysis as she states she has some dysuria. Patient was given IV Zofran for treatment.     Upon repeat evaluation patient's symptoms have improved with Zofran, she is able to tolerate soda. She was also given IV fluids. Urinalysis only shows mild dehydration. She was advised to follow-up with GI for possible allergy testing. Her vital signs remain within her normal limits and she is safe for discharge.   Tomasita CrumbleAdeleke Marietta Sikkema, MD 04/15/14 [phone]

## 2014-04-14 NOTE — ED Notes (Signed)
Pt states every 3 to 4 weeks she goes through periods where she is vomiting  Pt states she saw a GI specialist that states he could not find anything  Pt states she started vomiting again 2 days ago  Pt states she has some abd pain with the vomiting

## 2014-04-15 LAB — URINALYSIS, ROUTINE W REFLEX MICROSCOPIC
Glucose, UA: NEGATIVE mg/dL
KETONES UR: 40 mg/dL — AB
NITRITE: NEGATIVE
Protein, ur: 30 mg/dL — AB
Specific Gravity, Urine: 1.034 — ABNORMAL HIGH (ref 1.005–1.030)
UROBILINOGEN UA: 1 mg/dL (ref 0.0–1.0)
pH: 5.5 (ref 5.0–8.0)

## 2014-04-15 LAB — PREGNANCY, URINE: Preg Test, Ur: NEGATIVE

## 2014-04-15 LAB — URINE MICROSCOPIC-ADD ON

## 2014-04-15 NOTE — ED Notes (Signed)
Lab called, they state UA is still in process

## 2014-04-15 NOTE — Discharge Instructions (Signed)
Nausea and Vomiting [name], you were seen for nausea and vomiting. Continue to take Zofran at home as needed. Follow-up with the GI doctor for continued evaluation. If your symptoms worsen or you develop abdominal pain come back to the emergency department immediately for repeat evaluation. Thank you. Nausea means you feel sick to your stomach. Throwing up (vomiting) is a reflex where stomach contents come out of your mouth. HOME CARE   Take medicine as told by your doctor.  Do not force yourself to eat. However, you do need to drink fluids.  If you feel like eating, eat a normal diet as told by your doctor.  Eat rice, wheat, potatoes, bread, lean meats, yogurt, fruits, and vegetables.  Avoid high-fat foods.  Drink enough fluids to keep your pee (urine) clear or pale yellow.  Ask your doctor how to replace body fluid losses (rehydrate). Signs of body fluid loss (dehydration) include:  Feeling very thirsty.  Dry lips and mouth.  Feeling dizzy.  Dark pee.  Peeing less than normal.  Feeling confused.  Fast breathing or heart rate. GET HELP RIGHT AWAY IF:   You have blood in your throw up.  You have black or bloody poop (stool).  You have a bad headache or stiff neck.  You feel confused.  You have bad belly (abdominal) pain.  You have chest pain or trouble breathing.  You do not pee at least once every 8 hours.  You have cold, clammy skin.  You keep throwing up after 24 to 48 hours.  You have a fever. MAKE SURE YOU:   Understand these instructions.  Will watch your condition.  Will get help right away if you are not doing well or get worse. Document Released: 10/12/2007 Document Revised: 07/18/2011 Document Reviewed: 09/24/2010 [facility] Patient Information 2015 [employer] [place]. This information is not intended to replace advice given to you by your health care provider. Make sure you discuss any questions you have with your health care provider.

## 2014-04-16 ENCOUNTER — Emergency Department (HOSPITAL_COMMUNITY)
Admission: EM | Admit: 2014-04-16 | Discharge: 2014-04-16 | Disposition: A | Discharge status: Home / Self Care | Payer: 59 | Attending: Emergency Medicine | Admitting: Emergency Medicine

## 2014-04-16 ENCOUNTER — Encounter (HOSPITAL_COMMUNITY): Payer: Self-pay

## 2014-04-16 DIAGNOSIS — Z3202 Encounter for pregnancy test, result negative: Secondary | ICD-10-CM | POA: Insufficient documentation

## 2014-04-16 DIAGNOSIS — R112 Nausea with vomiting, unspecified: Secondary | ICD-10-CM

## 2014-04-16 DIAGNOSIS — Z862 Personal history of diseases of the blood and blood-forming organs and certain disorders involving the immune mechanism: Secondary | ICD-10-CM | POA: Insufficient documentation

## 2014-04-16 DIAGNOSIS — N390 Urinary tract infection, site not specified: Secondary | ICD-10-CM

## 2014-04-16 DIAGNOSIS — Z79899 Other long term (current) drug therapy: Secondary | ICD-10-CM | POA: Insufficient documentation

## 2014-04-16 LAB — CBC WITH DIFFERENTIAL/PLATELET
BASOS ABS: 0 10*3/uL (ref 0.0–0.1)
Basophils Relative: 0 % (ref 0–1)
Eosinophils Absolute: 0 10*3/uL (ref 0.0–0.7)
Eosinophils Relative: 0 % (ref 0–5)
HEMATOCRIT: 41.6 % (ref 36.0–46.0)
HEMOGLOBIN: 13.6 g/dL (ref 12.0–15.0)
Lymphocytes Relative: 21 % (ref 12–46)
Lymphs Abs: 0.7 10*3/uL (ref 0.7–4.0)
MCH: 26.5 pg (ref 26.0–34.0)
MCHC: 32.7 g/dL (ref 30.0–36.0)
MCV: 80.9 fL (ref 78.0–100.0)
MONO ABS: 0.4 10*3/uL (ref 0.1–1.0)
Monocytes Relative: 12 % (ref 3–12)
NEUTROS ABS: 2.1 10*3/uL (ref 1.7–7.7)
Neutrophils Relative %: 67 % (ref 43–77)
Platelets: 241 10*3/uL (ref 150–400)
RBC: 5.14 MIL/uL — ABNORMAL HIGH (ref 3.87–5.11)
RDW: 13 % (ref 11.5–15.5)
WBC: 3.1 10*3/uL — ABNORMAL LOW (ref 4.0–10.5)

## 2014-04-16 LAB — URINE MICROSCOPIC-ADD ON

## 2014-04-16 LAB — BASIC METABOLIC PANEL
ANION GAP: 16 — AB (ref 5–15)
BUN: 10 mg/dL (ref 6–23)
CO2: 24 meq/L (ref 19–32)
CREATININE: 0.79 mg/dL (ref 0.50–1.10)
Calcium: 10 mg/dL (ref 8.4–10.5)
Chloride: 100 mEq/L (ref 96–112)
GFR calc Af Amer: >90 mL/min (ref >90)
GFR calc non Af Amer: >90 mL/min (ref >90)
Glucose, Bld: 98 mg/dL (ref 70–99)
POTASSIUM: 3.9 meq/L (ref 3.7–5.3)
Sodium: 140 mEq/L (ref 137–147)

## 2014-04-16 LAB — URINALYSIS, ROUTINE W REFLEX MICROSCOPIC
Glucose, UA: NEGATIVE mg/dL
Ketones, ur: >80 mg/dL — AB
NITRITE: NEGATIVE
Protein, ur: 30 mg/dL — AB
SPECIFIC GRAVITY, URINE: 1.036 — AB (ref 1.005–1.030)
Urobilinogen, UA: 0.2 mg/dL (ref 0.0–1.0)
pH: 6 (ref 5.0–8.0)

## 2014-04-16 LAB — PREGNANCY, URINE: Preg Test, Ur: NEGATIVE

## 2014-04-16 MED ORDER — PROMETHAZINE HCL 25 MG/ML IJ SOLN
25.0000 mg | Freq: Once | INTRAMUSCULAR | Status: DC
  Filled 2014-04-16: qty 1

## 2014-04-16 MED ORDER — CEPHALEXIN 500 MG PO CAPS: 500.0000 mg | ORAL_CAPSULE | Freq: Four times a day (QID) | ORAL | Status: DC

## 2014-04-16 MED ORDER — MORPHINE SULFATE 4 MG/ML IJ SOLN
4.0000 mg | Freq: Once | INTRAMUSCULAR | Status: AC
  Administered 2014-04-16: 4 mg via INTRAVENOUS
  Filled 2014-04-16: qty 1

## 2014-04-16 MED ORDER — ONDANSETRON 4 MG PO TBDP: 4.0000 mg | ORAL_TABLET | Freq: Three times a day (TID) | ORAL | Status: DC | PRN

## 2014-04-16 MED ORDER — ONDANSETRON HCL 4 MG/2ML IJ SOLN
4.0000 mg | Freq: Once | INTRAMUSCULAR | Status: AC
  Administered 2014-04-16: 4 mg via INTRAVENOUS
  Filled 2014-04-16: qty 2

## 2014-04-16 MED ORDER — SODIUM CHLORIDE 0.9 % IV BOLUS (SEPSIS)
1000.0000 mL | Freq: Once | INTRAVENOUS | Status: AC
  Administered 2014-04-16: 1000 mL via INTRAVENOUS

## 2014-04-16 NOTE — ED Notes (Signed)
Pt is aware she needs to provide urine specimen unable to at this time

## 2014-04-16 NOTE — Discharge Instructions (Signed)
°  Take keflex as directed until gone. Take Zofran as needed for nausea. Refer to attached documents for more information.

## 2014-04-16 NOTE — ED Notes (Signed)
Per pt, constant emesis. Seen two days ago.  Today having diarrhea.  States all test are normal but wants admission so that we can figure out what is wrong.

## 2014-04-16 NOTE — ED Provider Notes (Signed)
CSN: [id number]     Arrival date & time 04/16/14  [id number] History   First MD Initiated Contact with Patient 04/16/14 1001     Chief Complaint  Patient presents with  . Emesis    HPI: Elizabeth Fisher is a 23 year old African American female with no significant PMH presenting to the ED on 04/16/2014 for refractory nausea and vomiting. The patient reports she has been having recurrent vomiting over the past year and has been seen by several specialists but never given a definitive diagnosis. She was recently evaluated in the ED on 04/14/2014 for her vomiting and was given IV Zofran which she says relieved her symptoms. Since discharge, she reports trying to consume cereal several times yesterday but was unable to keep the food down. She also tried consuming water and Ginger Ale throughout the day but continued to vomit. She says the last full meal she was able to keep down was on Saturday or Sunday. She has been taking her Zofran but says it is not controlling her symptoms. She reports having two episodes of diarrhea this morning but says she did not notice any blood in her stool. She reports having a dry cough that has been present over the past week but denies any mucous.  Past Medical History  Diagnosis Date  . Sickle cell trait    History reviewed. No pertinent past surgical history. History reviewed. No pertinent family history. History  Substance Use Topics  . Smoking status: Never Smoker   . Smokeless tobacco: Not on file  . Alcohol Use: No   OB History    No data available     Review of Systems  Constitutional: Positive for chills and appetite change. Negative for fever, diaphoresis, activity change and fatigue.  HENT: Negative for congestion, ear discharge, ear pain, facial swelling, mouth sores, sinus pressure, sore throat, trouble swallowing and voice change.   Eyes: Negative for photophobia and visual disturbance.  Respiratory: Positive for cough. Negative for chest tightness,  shortness of breath, wheezing and stridor.   Cardiovascular: Negative for chest pain, palpitations and leg swelling.  Gastrointestinal: Positive for nausea, vomiting, abdominal pain and diarrhea. Negative for constipation, blood in stool and abdominal distention.  Genitourinary: Negative for dysuria, urgency, frequency, hematuria, flank pain, difficulty urinating and pelvic pain.  Musculoskeletal: Negative for back pain, arthralgias, gait problem, neck pain and neck stiffness.  Skin: Negative for pallor and rash.  Neurological: Negative for dizziness, tremors, syncope, weakness, light-headedness, numbness and headaches.  Hematological: Negative for adenopathy. Does not bruise/bleed easily.      Allergies  Other and Shellfish allergy  Home Medications   Prior to Admission medications   Medication Sig Start Date End Date Taking? Authorizing Provider  metoCLOPramide (REGLAN) 5 MG tablet Take 5 mg by mouth 4 (four) times daily.   Yes Historical Provider, MD  norethindrone-ethinyl estradiol (JUNEL FE,GILDESS FE,LOESTRIN FE) 1-20 MG-MCG tablet Take 1 tablet by mouth daily.   Yes Historical Provider, MD  omeprazole (PRILOSEC) 20 MG capsule Take 1 capsule (20 mg total) by mouth daily. 01/23/14  Yes Gara Kroneriana Truong, MD  ondansetron (ZOFRAN ODT) 4 MG disintegrating tablet Take 1 tablet (4 mg total) by mouth every 8 (eight) hours as needed for nausea or vomiting. 01/23/14  Yes Gara Kroneriana Truong, MD  ondansetron (ZOFRAN ODT) 4 MG disintegrating tablet 4mg  ODT q4 hours prn nausea/vomit Patient not taking: Reported on 04/14/2014 11/30/13   Enid SkeensJoshua M Zavitz, MD   BP 129/85 mmHg  Pulse 85  Temp(Src) 98.8 F (37.1 C) (Oral)  Resp 16  SpO2 100%  LMP 04/11/2014 (Approximate) Physical Exam  Constitutional: She is oriented to person, place, and time. She appears well-developed and well-nourished. No distress.  HENT:  Head: Normocephalic and atraumatic.  Nose: Nose normal.  Mouth/Throat: Oropharynx is clear and  moist. No oropharyngeal exudate.  Eyes: Conjunctivae are normal. Pupils are equal, round, and reactive to light. Right eye exhibits no discharge. Left eye exhibits no discharge.  Neck: Normal range of motion. Neck supple. No thyromegaly present.  Cardiovascular: Normal rate, regular rhythm and intact distal pulses.  Exam reveals no gallop and no friction rub.   No murmur heard. Pulmonary/Chest: Effort normal and breath sounds normal. No stridor. No respiratory distress. She has no wheezes. She has no rales. She exhibits no tenderness.  Abdominal: Soft. Bowel sounds are normal. She exhibits no distension and no mass. There is no hepatosplenomegaly. There is tenderness in the suprapubic area. There is no rebound, no guarding and no CVA tenderness.  Musculoskeletal: Normal range of motion. She exhibits no edema or tenderness.  Lymphadenopathy:    She has no cervical adenopathy.  Neurological: She is alert and oriented to person, place, and time. No cranial nerve deficit. Coordination normal.  Skin: Skin is warm and dry. No rash noted. She is not diaphoretic. No erythema. No pallor.  Nursing note and vitals reviewed.   ED Course  Procedures (including critical care time) Labs Review Labs Reviewed  CBC WITH DIFFERENTIAL - Abnormal; Notable for the following:    WBC 3.1 (*)    RBC 5.14 (*)    All other components within normal limits  BASIC METABOLIC PANEL - Abnormal; Notable for the following:    Anion gap 16 (*)    All other components within normal limits  URINALYSIS, ROUTINE W REFLEX MICROSCOPIC - Abnormal; Notable for the following:    Color, Urine AMBER (*)    APPearance CLOUDY (*)    Specific Gravity, Urine 1.036 (*)    Hgb urine dipstick TRACE (*)    Bilirubin Urine SMALL (*)    Ketones, ur >80 (*)    Protein, ur 30 (*)    Leukocytes, UA SMALL (*)    All other components within normal limits  URINE MICROSCOPIC-ADD ON - Abnormal; Notable for the following:    Squamous Epithelial  / LPF FEW (*)    Bacteria, UA FEW (*)    All other components within normal limits  PREGNANCY, URINE    Imaging Review No results found.   EKG Interpretation None      MDM   Final diagnoses:  UTI (lower urinary tract infection)  Non-intractable vomiting with nausea, vomiting of unspecified type    2:17 PM Patient's labs unremarkable for acute changes. Patient's urinalysis shows UTI. Patient will be treated for UTI and instructed to follow up with her PCP. Vitals stable and patient afebrile.    Emilia BeckKaitlyn Enedelia Martorelli, PA-C 04/16/14 1418  Tilden FossaElizabeth Rees, MD 04/16/14 1714

## 2014-04-16 NOTE — Progress Notes (Signed)
23 [place] 5 [place] visits and no admissions in the last 6 months C/o Discussed 5 ED visits.  Pt states she was seeing Dr Gust BroomsJaralla but is not any more because she can not afford to go to her since pt's mother insurance coverage changed after getting a new job and is awaiting mother's job 90 probational period.  Pt has not attempt to find coverage for herself although she has a job.  Discussed 5 ED visits. [facility]aged [facility] & evaluation of "abnormal periods all my life"  Pt tearful during interaction because she states her family hx includes various cancer issues and she reports not being able to work because of s/s of nausea.  Confirmed with CM she has no anxiety or depression issues. Hs mother mainly for support when discussed stress at work or medical concerns.  Reports online s/s check used to try to help her find out why she continues to have issues. Reports being referred to GI specialist and "nothing was found. He did not tell me to return and I can not afford to go back to Dr Gust BroomsJaralla"  Discussed with pt what to expect in ED-- if no concerns may be offered f/u with specialist or pcp, or if EDP finds abnormal labs, imaging that needs further evaluation may consult a hospital provider prn Pt voiced understanding  CM spoke with pt who [place] [facility] [place]. CM discussed and provided written information for self pay pcps, importance of pcp for f/u care (as well as a Games developergate keeper in guidance for referrals and management of chronic ongoing medical issues issues), www.needymeds.org, www.goodrx.com, discounted pharmacies and other [place] resources such as [facility] (including eligibility for [employer] card, pharmacies & specialists), [facility], affordable care act,  financial assistance, DSS and  health department  Reviewed resources for [place] pcps like [facility], family medicine at [address], [facility]  family practice, general medical clinics, [facility] urgent care plus others, medication resources, [facility] out patient pharmacies and housing Pt voiced understanding and appreciation of resources provided   Provided [facility] contact information Pt agreed to a referral Reports demographics are correct in EPIC Referral completed

## 2014-04-16 NOTE — ED Notes (Signed)
Pt is driving.  D/C phenergan and order for zofran given.

## 2014-08-18 ENCOUNTER — Encounter (HOSPITAL_COMMUNITY): Payer: Self-pay | Admitting: Emergency Medicine

## 2014-08-18 ENCOUNTER — Emergency Department (HOSPITAL_COMMUNITY): Payer: 59

## 2014-08-18 ENCOUNTER — Emergency Department (HOSPITAL_COMMUNITY)
Admission: EM | Admit: 2014-08-18 | Discharge: 2014-08-18 | Disposition: A | Discharge status: Home / Self Care | Payer: 59 | Attending: Emergency Medicine | Admitting: Emergency Medicine

## 2014-08-18 DIAGNOSIS — J029 Acute pharyngitis, unspecified: Secondary | ICD-10-CM | POA: Insufficient documentation

## 2014-08-18 DIAGNOSIS — Z862 Personal history of diseases of the blood and blood-forming organs and certain disorders involving the immune mechanism: Secondary | ICD-10-CM | POA: Insufficient documentation

## 2014-08-18 LAB — RAPID STREP SCREEN (MED CTR MEBANE ONLY): STREPTOCOCCUS, GROUP A SCREEN (DIRECT): NEGATIVE

## 2014-08-18 MED ORDER — IBUPROFEN 100 MG/5ML PO SUSP
500.0000 mg | Freq: Once | ORAL | Status: AC
  Administered 2014-08-18: 500 mg via ORAL
  Filled 2014-08-18 (×2): qty 25

## 2014-08-18 MED ORDER — DEXAMETHASONE SODIUM PHOSPHATE 10 MG/ML IJ SOLN
10.0000 mg | Freq: Once | INTRAMUSCULAR | Status: AC
  Administered 2014-08-18: 10 mg via INTRAMUSCULAR
  Filled 2014-08-18: qty 1

## 2014-08-18 NOTE — ED Notes (Signed)
Pt c/o sore throat with painful swallowing x 1 day. Denies fever, denies n/v/d

## 2014-08-18 NOTE — ED Provider Notes (Signed)
CSN: [id number]     Arrival date & time 08/18/14  [id number] History   First MD Initiated Contact with Patient 08/18/14 0703     Chief Complaint  Patient presents with  . Sore Throat     (Consider location/radiation/quality/duration/timing/severity/associated sxs/prior Treatment) HPI Comments: 24 year old female no medical history, no sick contacts, no recent travel, no infectious disease history presents with sore throat since yesterday. Worse with swallowing and central. No vomiting, tolerating oral fluids. No fevers or chills.  Patient is a 24 y.o. female presenting with pharyngitis. The history is provided by the patient.  Sore Throat Pertinent negatives include no abdominal pain and no headaches.    Past Medical History  Diagnosis Date  . Sickle cell trait    History reviewed. No pertinent past surgical history. No family history on file. History  Substance Use Topics  . Smoking status: Never Smoker   . Smokeless tobacco: Not on file  . Alcohol Use: No   OB History    No data available     Review of Systems  Constitutional: Negative for fever and chills.  HENT: Positive for sore throat. Negative for congestion.   Gastrointestinal: Negative for vomiting and abdominal pain.  Musculoskeletal: Negative for back pain, neck pain and neck stiffness.  Skin: Negative for rash.  Neurological: Negative for light-headedness and headaches.      Allergies  Other and Shellfish allergy  Home Medications   Prior to Admission medications   Medication Sig Start Date End Date Taking? Authorizing Provider  cephALEXin (KEFLEX) 500 MG capsule Take 1 capsule (500 mg total) by mouth 4 (four) times daily. Patient not taking: Reported on 08/18/2014 04/16/14   Emilia BeckKaitlyn Szekalski, PA-C  omeprazole (PRILOSEC) 20 MG capsule Take 1 capsule (20 mg total) by mouth daily. Patient not taking: Reported on 08/18/2014 01/23/14   Denton Brickiana M Truong, MD  ondansetron (ZOFRAN ODT) 4 MG disintegrating tablet Take 1  tablet (4 mg total) by mouth every 8 (eight) hours as needed for nausea or vomiting. Patient not taking: Reported on 08/18/2014 04/16/14   Emilia BeckKaitlyn Szekalski, PA-C   BP 127/83 mmHg  Pulse 96  Temp(Src) 98.8 F (37.1 C) (Oral)  Resp 19  Ht 5\' 4"  (1.626 m)  Wt 108 lb (48.988 kg)  BMI 18.53 kg/m2  SpO2 100%  LMP 08/15/2014 Physical Exam  Constitutional: She appears well-developed and well-nourished. No distress.  HENT:  Head: Normocephalic and atraumatic.  Mild anterior cervical adenopathy bilateral, no meningismus, mild discomfort with palpation anterior midline superior to thyroid region. No trismus, uvular deviation, unilateral posterior pharyngeal edema or submandibular swelling. No voice changes appreciated, no drooling  Eyes: Conjunctivae are normal. Pupils are equal, round, and reactive to light.  Neck: Normal range of motion. Neck supple.  Lymphadenopathy:    She has cervical adenopathy.    ED Course  Procedures (including critical care time) Labs Review Labs Reviewed  RAPID STREP SCREEN  CULTURE, GROUP A STREP    Imaging Review Dg Neck Soft Tissue  08/18/2014   CLINICAL DATA:  Sore throat and difficulty swallowing since 08/17/2014.  EXAM: NECK SOFT TISSUES - 1+ VIEW  COMPARISON:  None.  FINDINGS: There is no evidence of retropharyngeal soft tissue swelling or epiglottic enlargement. The cervical airway is unremarkable and no radio-opaque foreign body identified.  IMPRESSION: Negative exam.   Electronically Signed   By: Drusilla Kannerhomas  Dalessio M.D.   On: 08/18/2014 07:29     EKG Interpretation None      MDM  Final diagnoses:  Acute pharyngitis, unspecified pharyngitis type   Strep neg.  Xray reviewed no signs of epiglottitis.   Results and differential diagnosis were discussed with the patient/parent/guardian. Close follow up outpatient was discussed, comfortable with the plan.   Medications  dexamethasone (DECADRON) injection 10 mg (10 mg Intramuscular Given  08/18/14 0726)  ibuprofen (ADVIL,MOTRIN) 100 MG/5ML suspension 500 mg (500 mg Oral Given 08/18/14 0726)    Filed Vitals:   08/18/14 0640  BP: 127/83  Pulse: 96  Temp: 98.8 F (37.1 C)  TempSrc: Oral  Resp: 19  Height:  (1.626 m)  Weight: 108 lb (48.988 kg)  SpO2: 100%    Final diagnoses:  Acute pharyngitis, unspecified pharyngitis type       Blane Ohara, MD 08/18/14 [phone]

## 2014-08-18 NOTE — Discharge Instructions (Signed)
If you were given medicines take as directed.  If you are on coumadin or contraceptives realize their levels and effectiveness is altered by many different medicines.  If you have any reaction (rash, tongues swelling, other) to the medicines stop taking and see a physician.   Take ibuprofen and tylenol every 6 hrs as needed for pain. Gargle and spit salt water a few times a day.  Tea and honey as needed.   Please follow up as directed and return to the ER or see a physician for new or worsening symptoms.  Thank you. Filed Vitals:   08/18/14 0640  BP: 127/83  Pulse: 96  Temp: 98.8 F (37.1 C)  TempSrc: Oral  Resp: 19  Height: 5\' 4"  (1.626 m)  Weight: 108 lb (48.988 kg)  SpO2: 100%

## 2014-08-18 NOTE — ED Notes (Signed)
Patient transported to X-ray 

## 2014-08-20 ENCOUNTER — Emergency Department (HOSPITAL_COMMUNITY)
Admission: EM | Admit: 2014-08-20 | Discharge: 2014-08-20 | Disposition: A | Discharge status: Home / Self Care | Payer: 59 | Attending: Emergency Medicine | Admitting: Emergency Medicine

## 2014-08-20 ENCOUNTER — Encounter (HOSPITAL_COMMUNITY): Payer: Self-pay | Admitting: *Deleted

## 2014-08-20 ENCOUNTER — Other Ambulatory Visit: Payer: Self-pay | Admitting: Emergency Medicine

## 2014-08-20 DIAGNOSIS — Z862 Personal history of diseases of the blood and blood-forming organs and certain disorders involving the immune mechanism: Secondary | ICD-10-CM | POA: Insufficient documentation

## 2014-08-20 DIAGNOSIS — J029 Acute pharyngitis, unspecified: Secondary | ICD-10-CM | POA: Insufficient documentation

## 2014-08-20 DIAGNOSIS — Z79899 Other long term (current) drug therapy: Secondary | ICD-10-CM | POA: Insufficient documentation

## 2014-08-20 LAB — MONONUCLEOSIS SCREEN: MONO SCREEN: NEGATIVE

## 2014-08-20 LAB — CULTURE, GROUP A STREP: STREP A CULTURE: NEGATIVE

## 2014-08-20 MED ORDER — DEXAMETHASONE 4 MG PO TABS: 6.0000 mg | ORAL_TABLET | Freq: Once | ORAL | Status: DC

## 2014-08-20 NOTE — ED Provider Notes (Signed)
CSN: [id number]     Arrival date & time 08/20/14  0100 History   First MD Initiated Contact with Patient 08/20/14 [phone]     Chief Complaint  Patient presents with  . Sore Throat     (Consider location/radiation/quality/duration/timing/severity/associated sxs/prior Treatment) HPI Comments: Patient was seen 2 days ago for sore throat, headache, negative strep test, as well as a negative soft tissue of the neck x-ray presents with persistent pain.  She states she was given ibuprofen, which makes her sore throat worse, so has not taken any medication for her discomfort.  She denies any fever, trauma to her neck.  Patient is a 24 y.o. female presenting with pharyngitis. The history is provided by the patient.  Sore Throat This is a recurrent problem. The current episode started in the past 7 days. The problem occurs constantly. The problem has been unchanged. Associated symptoms include a sore throat. Pertinent negatives include no abdominal pain, anorexia, chills, coughing, numbness, rash or swollen glands.    Past Medical History  Diagnosis Date  . Sickle cell trait    History reviewed. No pertinent past surgical history. No family history on file. History  Substance Use Topics  . Smoking status: Never Smoker   . Smokeless tobacco: Not on file  . Alcohol Use: No   OB History    No data available     Review of Systems  Constitutional: Negative for chills.  HENT: Positive for sore throat and trouble swallowing. Negative for voice change.   Respiratory: Negative for cough.   Gastrointestinal: Negative for abdominal pain and anorexia.  Skin: Negative for rash.  Neurological: Negative for dizziness and numbness.  All other systems reviewed and are negative.     Allergies  Other and Shellfish allergy  Home Medications   Prior to Admission medications   Medication Sig Start Date End Date Taking? Authorizing Provider  ibuprofen (ADVIL,MOTRIN) 200 MG tablet Take 600 mg by mouth  every 6 (six) hours as needed for moderate pain.   Yes Historical Provider, MD  cephALEXin (KEFLEX) 500 MG capsule Take 1 capsule (500 mg total) by mouth 4 (four) times daily. Patient not taking: Reported on 08/18/2014 04/16/14   Emilia Beck, PA-C  omeprazole (PRILOSEC) 20 MG capsule Take 1 capsule (20 mg total) by mouth daily. Patient not taking: Reported on 08/18/2014 01/23/14   Denton Brick, MD  ondansetron (ZOFRAN ODT) 4 MG disintegrating tablet Take 1 tablet (4 mg total) by mouth every 8 (eight) hours as needed for nausea or vomiting. Patient not taking: Reported on 08/18/2014 04/16/14   Emilia Beck, PA-C   BP 129/88 mmHg  Pulse 78  Resp 16  SpO2 100%  LMP 08/15/2014 Physical Exam  Constitutional: She appears well-developed and well-nourished.  HENT:  Head: Normocephalic.  Right Ear: External ear normal.  Left Ear: External ear normal.  Mouth/Throat: Oropharynx is clear and moist.  Eyes: Pupils are equal, round, and reactive to light.  Neck: Normal range of motion.  Cardiovascular: Normal rate.   Pulmonary/Chest: Effort normal.  Musculoskeletal: Normal range of motion.  Neurological: She is alert.  Skin: Skin is warm. No rash noted.  Nursing note and vitals reviewed.   ED Course  Procedures (including critical care time) Labs Review Labs Reviewed - No data to display  Imaging Review No results found.   EKG Interpretation None     Viewed x-ray and strep test from 2 days ago.  I've added a Monospot today and given the patient.  Decadron for her discomfort MDM   Final diagnoses:  Pharyngitis         Earley FavorGail Betzabeth Derringer, NP 08/20/14 1950  Earley FavorGail Shanea Karney, NP 08/20/14 1951  Marisa Severinlga Otter, MD 08/21/14 [phone]

## 2014-08-20 NOTE — ED Notes (Signed)
See paper charting for previous vital signs, pain level, and medications that were given.

## 2014-08-20 NOTE — ED Notes (Signed)
Pt complains of a sore throat since last Friday, states strep was negative, still having sore throat pain after decadron and toradol given, pt resting in bed at this time.

## 2014-08-20 NOTE — Discharge Instructions (Signed)
Strep test is negative.  The x-ray of your neck shows no obstruction You havebeen given a dose of Decadron in the emergency room which is a very potent anti-inflammatory medication, you can safely take Tylenol or ibuprofen or Advil for your discomfort.  You have been given a referral to ENT.  Dr. Jenne PaneBates if you have persistent pain

## 2014-08-21 ENCOUNTER — Emergency Department (HOSPITAL_COMMUNITY): Payer: 59

## 2014-08-21 ENCOUNTER — Inpatient Hospital Stay (HOSPITAL_COMMUNITY)
Admission: EM | Admit: 2014-08-21 | Discharge: 2014-08-24 | DRG: 156 | Disposition: A | Discharge status: Home / Self Care | Payer: Self-pay | Attending: Internal Medicine | Admitting: Internal Medicine

## 2014-08-21 ENCOUNTER — Encounter (HOSPITAL_COMMUNITY): Payer: Self-pay | Admitting: *Deleted

## 2014-08-21 DIAGNOSIS — J387 Other diseases of larynx: Principal | ICD-10-CM | POA: Diagnosis present

## 2014-08-21 DIAGNOSIS — R1319 Other dysphagia: Secondary | ICD-10-CM

## 2014-08-21 DIAGNOSIS — J45990 Exercise induced bronchospasm: Secondary | ICD-10-CM | POA: Diagnosis present

## 2014-08-21 DIAGNOSIS — R1310 Dysphagia, unspecified: Secondary | ICD-10-CM

## 2014-08-21 DIAGNOSIS — D571 Sickle-cell disease without crisis: Secondary | ICD-10-CM | POA: Diagnosis present

## 2014-08-21 HISTORY — DX: Other diseases of larynx: J38.7

## 2014-08-21 HISTORY — DX: Exercise induced bronchospasm: J45.990

## 2014-08-21 LAB — URINALYSIS, ROUTINE W REFLEX MICROSCOPIC
Glucose, UA: NEGATIVE mg/dL
HGB URINE DIPSTICK: NEGATIVE
Ketones, ur: 40 mg/dL — AB
Nitrite: NEGATIVE
Protein, ur: 100 mg/dL — AB
Specific Gravity, Urine: 1.036 — ABNORMAL HIGH (ref 1.005–1.030)
Urobilinogen, UA: 1 mg/dL (ref 0.0–1.0)
pH: 5 (ref 5.0–8.0)

## 2014-08-21 LAB — CBC WITH DIFFERENTIAL/PLATELET
BASOS ABS: 0 10*3/uL (ref 0.0–0.1)
Basophils Relative: 0 % (ref 0–1)
EOS ABS: 0 10*3/uL (ref 0.0–0.7)
Eosinophils Relative: 0 % (ref 0–5)
HCT: 43.3 % (ref 36.0–46.0)
Hemoglobin: 15.2 g/dL — ABNORMAL HIGH (ref 12.0–15.0)
Lymphocytes Relative: 9 % — ABNORMAL LOW (ref 12–46)
Lymphs Abs: 0.8 10*3/uL (ref 0.7–4.0)
MCH: 28.3 pg (ref 26.0–34.0)
MCHC: 35.1 g/dL (ref 30.0–36.0)
MCV: 80.6 fL (ref 78.0–100.0)
MONO ABS: 0.5 10*3/uL (ref 0.1–1.0)
Monocytes Relative: 6 % (ref 3–12)
NEUTROS ABS: 7.2 10*3/uL (ref 1.7–7.7)
NEUTROS PCT: 85 % — AB (ref 43–77)
Platelets: 263 10*3/uL (ref 150–400)
RBC: 5.37 MIL/uL — ABNORMAL HIGH (ref 3.87–5.11)
RDW: 12.3 % (ref 11.5–15.5)
WBC: 8.5 10*3/uL (ref 4.0–10.5)

## 2014-08-21 LAB — COMPREHENSIVE METABOLIC PANEL
ALT: 21 U/L (ref 0–35)
ANION GAP: 17 — AB (ref 5–15)
AST: 28 U/L (ref 0–37)
Albumin: 4.5 g/dL (ref 3.5–5.2)
Alkaline Phosphatase: 58 U/L (ref 39–117)
BILIRUBIN TOTAL: 0.8 mg/dL (ref 0.3–1.2)
BUN: 15 mg/dL (ref 6–23)
CHLORIDE: 104 mmol/L (ref 96–112)
CO2: 21 mmol/L (ref 19–32)
CREATININE: 1 mg/dL (ref 0.50–1.10)
Calcium: 9.8 mg/dL (ref 8.4–10.5)
GFR calc Af Amer: >90 mL/min (ref >90)
GFR, EST NON AFRICAN AMERICAN: 78 mL/min — AB (ref >90)
Glucose, Bld: 91 mg/dL (ref 70–99)
Potassium: 3.7 mmol/L (ref 3.5–5.1)
Sodium: 142 mmol/L (ref 135–145)
Total Protein: 8.3 g/dL (ref 6.0–8.3)

## 2014-08-21 LAB — URINE MICROSCOPIC-ADD ON

## 2014-08-21 LAB — I-STAT CG4 LACTIC ACID, ED: Lactic Acid, Venous: 1.51 mmol/L (ref 0.5–2.0)

## 2014-08-21 LAB — POC URINE PREG, ED: PREG TEST UR: NEGATIVE

## 2014-08-21 MED ORDER — IOHEXOL 300 MG/ML  SOLN
80.0000 mL | Freq: Once | INTRAMUSCULAR | Status: AC | PRN
  Administered 2014-08-21: 80 mL via INTRAVENOUS

## 2014-08-21 MED ORDER — CLINDAMYCIN PHOSPHATE 600 MG/50ML IV SOLN
600.0000 mg | Freq: Once | INTRAVENOUS | Status: AC
  Administered 2014-08-21: 600 mg via INTRAVENOUS
  Filled 2014-08-21: qty 50

## 2014-08-21 MED ORDER — GI COCKTAIL ~~LOC~~
30.0000 mL | Freq: Once | ORAL | Status: AC
  Administered 2014-08-21: 30 mL via ORAL
  Filled 2014-08-21: qty 30

## 2014-08-21 MED ORDER — FENTANYL CITRATE (PF) 100 MCG/2ML IJ SOLN
50.0000 ug | Freq: Once | INTRAMUSCULAR | Status: AC
  Administered 2014-08-21: 50 ug via INTRAVENOUS
  Filled 2014-08-21: qty 2

## 2014-08-21 MED ORDER — ONDANSETRON HCL 4 MG/2ML IJ SOLN
4.0000 mg | Freq: Once | INTRAMUSCULAR | Status: AC
  Administered 2014-08-21: 4 mg via INTRAVENOUS
  Filled 2014-08-21: qty 2

## 2014-08-21 MED ORDER — PROMETHAZINE HCL 25 MG PO TABS
12.5000 mg | ORAL_TABLET | Freq: Four times a day (QID) | ORAL | Status: DC | PRN
Start: 2014-08-21 — End: 2014-08-24

## 2014-08-21 MED ORDER — SODIUM CHLORIDE 0.9 % IV BOLUS (SEPSIS)
500.0000 mL | Freq: Once | INTRAVENOUS | Status: AC
  Administered 2014-08-21: 500 mL via INTRAVENOUS

## 2014-08-21 MED ORDER — SODIUM CHLORIDE 0.9 % IV SOLN
INTRAVENOUS | Status: DC
  Administered 2014-08-21 – 2014-08-24 (×4): via INTRAVENOUS

## 2014-08-21 MED ORDER — CLINDAMYCIN PHOSPHATE 900 MG/50ML IV SOLN
900.0000 mg | Freq: Four times a day (QID) | INTRAVENOUS | Status: DC
  Administered 2014-08-22 – 2014-08-24 (×11): 900 mg via INTRAVENOUS
  Filled 2014-08-21 (×14): qty 50

## 2014-08-21 MED ORDER — FENTANYL CITRATE (PF) 100 MCG/2ML IJ SOLN
100.0000 ug | Freq: Once | INTRAMUSCULAR | Status: AC
  Administered 2014-08-21: 100 ug via INTRAVENOUS
  Filled 2014-08-21: qty 2

## 2014-08-21 MED ORDER — HEPARIN SODIUM (PORCINE) 5000 UNIT/ML IJ SOLN
5000.0000 [IU] | Freq: Three times a day (TID) | INTRAMUSCULAR | Status: DC
  Administered 2014-08-22 – 2014-08-24 (×8): 5000 [IU] via SUBCUTANEOUS
  Filled 2014-08-21 (×10): qty 1

## 2014-08-21 NOTE — ED Notes (Signed)
Verified with minilab, poc pregnancy test is neg. Patient transported to CT scan.

## 2014-08-21 NOTE — ED Notes (Signed)
Explained to the patient testing must be completed prior to eating or drinking.

## 2014-08-21 NOTE — ED Notes (Addendum)
Pt swallowed GI cocktail with ease.

## 2014-08-21 NOTE — ED Notes (Signed)
Hoarseness has improved

## 2014-08-21 NOTE — ED Notes (Signed)
Dr. Newton at the bedside.  

## 2014-08-21 NOTE — Progress Notes (Signed)
Received pt report from Micah,RN-ED.

## 2014-08-21 NOTE — H&P (Addendum)
Hospitalist Admission History and Physical  Patient name: Elizabeth Fisher Medical record number: [id number] Date of birth: 03/13/1991 Age: 24 y.o. Gender: female  Primary Care Provider: Shamleffer, Madelin Rear, MD  Chief Complaint: subglottic abscess  History of Present Illness:This is a 24 y.o. year old female with no significant past medical history presenting with sub glottic abscess. Pt reports worsening painful swallowing over the past week. Pt denies any recent infections. Non smoker. No known recent sick contacts. Worsening sore throat and hoarseness over last 24 hours. Denies any trismus, SOB, drooling. Was seen in ER earlier and sent home. Negative strep test. Had worsening sxs. CT neck done in ER showing sub-glottic abscess.  Afebrile, hemodynamically stable on presentation. WBC 8.5.  Dr Effie Shy discussed case w/ Dr. Pollyann Kennedy w/ ENT. No airway compromise per report. Per report, Dr. Pollyann Kennedy recommended observation overnight w/ IV abx. Formal ENT consult in am.    Assessment and Plan: Elizabeth Fisher is a 24 y.o. year old female presenting with subglottic abscess   Active Problems:   Subglottic abscess   1-Subglottic abscess -no airway compromise, drooling, trismus -non toxic appearing clinically -IV clindamycin -blood and throat cx -formal ENT consult in am  -follow   FEN/GI: NPO  Prophylaxis: sub q heparin  Disposition: pending further evaluation  Code Status:Full Code    Patient Active Problem List   Diagnosis Date Noted  . Subglottic abscess 08/21/2014   Past Medical History: Past Medical History  Diagnosis Date  . Sickle cell trait   . Sickle cell anemia   . Asthma     Past Surgical History: History reviewed. No pertinent past surgical history.  Social History: History   Social History  . Marital Status: Single    Spouse Name: N/A  . Number of Children: N/A  . Years of Education: N/A   Social History Main Topics  . Smoking status: Never Smoker   .  Smokeless tobacco: Not on file  . Alcohol Use: No  . Drug Use: No  . Sexual Activity: Not on file   Other Topics Concern  . None   Social History Narrative    Family History: No family history on file.  Allergies: Allergies  Allergen Reactions  . Other Hives and Swelling    Blueberries,peaches and pineapple Swelling of the hands   . Shellfish Allergy Hives    Current Facility-Administered Medications  Medication Dose Route Frequency Provider Last Rate Last Dose  . 0.9 %  sodium chloride infusion   Intravenous Continuous Floydene Flock, MD      . clindamycin (CLEOCIN) IVPB 600 mg  600 mg Intravenous Once Mancel Bale, MD      . Melene Muller ON 08/22/2014] clindamycin (CLEOCIN) IVPB 900 mg  900 mg Intravenous 4 times per day Floydene Flock, MD      . fentaNYL (SUBLIMAZE) injection 50 mcg  50 mcg Intravenous Once Mancel Bale, MD      . heparin injection 5,000 Units  5,000 Units Subcutaneous 3 times per day Floydene Flock, MD      . promethazine (PHENERGAN) tablet 12.5 mg  12.5 mg Oral Q6H PRN Floydene Flock, MD       Current Outpatient Prescriptions  Medication Sig Dispense Refill  . ibuprofen (ADVIL,MOTRIN) 200 MG tablet Take 600 mg by mouth every 6 (six) hours as needed for moderate pain.    . cephALEXin (KEFLEX) 500 MG capsule Take 1 capsule (500 mg total) by mouth 4 (four) times daily. (Patient  not taking: Reported on 08/18/2014) 28 capsule 0  . omeprazole (PRILOSEC) 20 MG capsule Take 1 capsule (20 mg total) by mouth daily. (Patient not taking: Reported on 08/18/2014) 30 capsule 6  . ondansetron (ZOFRAN ODT) 4 MG disintegrating tablet Take 1 tablet (4 mg total) by mouth every 8 (eight) hours as needed for nausea or vomiting. (Patient not taking: Reported on 08/18/2014) 10 tablet 0   Review Of Systems: 12 point ROS negative except as noted above in HPI.  Physical Exam: Filed Vitals:   08/21/14 2031  BP: 113/87  Pulse: 79  Temp: 97.9 F (36.6 C)  Resp: 18    General:  alert and cooperative HEENT: PERRLA, extra ocular movement intact and mild post oropharyngeal erythema, minimal tonsillar swelling, bilateral cervical LAD tenderness Heart: S1, S2 normal, no murmur, rub or gallop, regular rate and rhythm Lungs: clear to auscultation, no wheezes or rales and unlabored breathing Abdomen: abdomen is soft without significant tenderness, masses, organomegaly or guarding Extremities: extremities normal, atraumatic, no cyanosis or edema Skin:no rashes Neurology: normal without focal findings  Labs and Imaging: Lab Results  Component Value Date/Time   NA 142 08/21/2014 06:50 PM   K 3.7 08/21/2014 06:50 PM   CL 104 08/21/2014 06:50 PM   CO2 21 08/21/2014 06:50 PM   BUN 15 08/21/2014 06:50 PM   CREATININE 1.00 08/21/2014 06:50 PM   GLUCOSE 91 08/21/2014 06:50 PM   Lab Results  Component Value Date   WBC 8.5 08/21/2014   HGB 15.2* 08/21/2014   HCT 43.3 08/21/2014   MCV 80.6 08/21/2014   PLT 263 08/21/2014    Ct Soft Tissue Neck W Contrast  08/21/2014   CLINICAL DATA:  Dysphasia, pain with swallowing. Symptoms for 4 days.  EXAM: CT NECK WITH CONTRAST  TECHNIQUE: Multidetector CT imaging of the neck was performed using the standard protocol following the bolus administration of intravenous contrast.  CONTRAST:  80mL OMNIPAQUE IOHEXOL 300 MG/ML  SOLN  COMPARISON:  Radiographs 08/18/2014  FINDINGS: Pharynx and larynx: 16 x 9 x 6 mm fluid collection with internal septation in the subglottic space overlying the left arytenoid cartilage. There is associated edema of the vocal cords and larynx. Palatine tonsils are normal. The epiglottis is normal.  Salivary glands: Symmetric enhancement.  No edema or mass.  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  Lymph nodes: No adenopathy.  Vascular: Normal.  Limited intracranial: Normal.  Mastoids and visualized paranasal sinuses: Mastoids are clear. Included paranasal sinuses are clear.  Skeleton:  Normal.  Upper chest: Lung apices are clear. The upper mediastinum is normal.  IMPRESSION: 1. Small subglottic fluid collection overlying the left arytenoid cartilage measuring 16 x 9 x 6 mm consistent with abscess. There is associated edema of the larynx and vocal cords. 2. 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 These results were called by telephone at the time of interpretation on 08/21/2014 at 8:44 pm to Dr. Mancel BaleELLIOTT WENTZ , who verbally acknowledged these results.   Electronically Signed   By: Rubye OaksMelanie  Ehinger M.D.   On: 08/21/2014 20:44           Doree AlbeeSteven Ethyl Vila MD  Pager: [phone]

## 2014-08-21 NOTE — ED Provider Notes (Signed)
CSN: [id number]     Arrival date & time 08/21/14  1634 History   First MD Initiated Contact with Patient 08/21/14 1716     Chief Complaint  Patient presents with  . Dysphagia     (Consider location/radiation/quality/duration/timing/severity/associated sxs/prior Treatment) The history is provided by the patient.   Elizabeth Fisher is a 24 y.o. female who presents for evaluation of inability to swallow and painful swallowing. This problem has been present for 6 days. She was evaluated at [facility] and treated with steroids and told to use ibuprofen. She continues to be uncomfortable. She states that she has been unable to tolerate any food or liquid. She immediately vomits after attempting to swallow anything. She also feels that she has a blockage, at the level of her suprasternal notch. She's never had this previously. She denies fever, chills, cough, shortness of breath or chest pain. There are no other known modifying factors.   Past Medical History  Diagnosis Date  . Sickle cell trait   . Sickle cell anemia   . Asthma    History reviewed. No pertinent past surgical history. No family history on file. History  Substance Use Topics  . Smoking status: Never Smoker   . Smokeless tobacco: Not on file  . Alcohol Use: No   OB History    No data available     Review of Systems  All other systems reviewed and are negative.     Allergies  Other and Shellfish allergy  Home Medications   Prior to Admission medications   Medication Sig Start Date End Date Taking? Authorizing Provider  ibuprofen (ADVIL,MOTRIN) 200 MG tablet Take 600 mg by mouth every 6 (six) hours as needed for moderate pain.   Yes Historical Provider, MD  cephALEXin (KEFLEX) 500 MG capsule Take 1 capsule (500 mg total) by mouth 4 (four) times daily. Patient not taking: Reported on 08/18/2014 04/16/14   Emilia Beck, PA-C  omeprazole (PRILOSEC) 20 MG capsule Take 1 capsule (20 mg  total) by mouth daily. Patient not taking: Reported on 08/18/2014 01/23/14   Denton Brick, MD  ondansetron (ZOFRAN ODT) 4 MG disintegrating tablet Take 1 tablet (4 mg total) by mouth every 8 (eight) hours as needed for nausea or vomiting. Patient not taking: Reported on 08/18/2014 04/16/14   Emilia Beck, PA-C   BP 113/87 mmHg  Pulse 79  Temp(Src) 97.9 F (36.6 C) (Oral)  Resp 18  Ht  (1.626 m)  Wt 108 lb (48.988 kg)  BMI 18.53 kg/m2  SpO2 100%  LMP 08/15/2014 Physical Exam  Constitutional: She is oriented to person, place, and time. She appears well-developed and well-nourished.  Patient is very slender.   HENT:  Head: Normocephalic and atraumatic.  Right Ear: External ear normal.  Left Ear: External ear normal.  Eyes: Conjunctivae and EOM are normal. Pupils are equal, round, and reactive to light.  Neck: Normal range of motion and phonation normal. Neck supple.  Cardiovascular: Normal rate, regular rhythm and normal heart sounds.   Pulmonary/Chest: Effort normal and breath sounds normal. She has no wheezes. She has no rales. She exhibits no tenderness and no bony tenderness.  There is no respiratory distress. There is no stridor.  Abdominal: Soft. She exhibits no distension. There is no tenderness. There is no guarding.  Patient is not controlling her secretions. She is spitting saliva. She occasionally gags and while it's a small amount of fluid.  Musculoskeletal: Normal range of motion.  Neurological:  She is alert and oriented to person, place, and time. No cranial nerve deficit or sensory deficit. She exhibits normal muscle tone. Coordination normal.  Skin: Skin is warm, dry and intact.  Psychiatric: She has a normal mood and affect. Her behavior is normal. Judgment and thought content normal.  Nursing note and vitals reviewed.   ED Course  Procedures (including critical care time)  GI cocktail ordered prior to my seeing patient, which she initially tolerated, but  then spit it out, by her report.  Medications  gi cocktail (Maalox,Lidocaine,Donnatal) (30 mLs Oral Given 08/21/14 1730)  sodium chloride 0.9 % bolus 500 mL (0 mLs Intravenous Stopped 08/21/14 2024)  ondansetron (ZOFRAN) injection 4 mg (4 mg Intravenous Given 08/21/14 1856)  fentaNYL (SUBLIMAZE) injection 100 mcg (100 mcg Intravenous Given 08/21/14 1858)  iohexol (OMNIPAQUE) 300 MG/ML solution 80 mL (80 mLs Intravenous Contrast Given 08/21/14 1956)    Patient Vitals for the past 24 hrs:  BP Temp Temp src Pulse Resp SpO2 Height Weight  08/21/14 2031 113/87 mmHg 97.9 F (36.6 C) Oral 79 18 100 % - -  08/21/14 1930 107/74 mmHg - - (!) 56 - 97 % - -  08/21/14 1901 142/98 mmHg - - 78 - 100 % - -  08/21/14 1830 143/81 mmHg - - 71 - 98 % - -  08/21/14 1800 117/92 mmHg - - 69 - 97 % - -  08/21/14 1757 117/83 mmHg - - 65 18 97 % - -  08/21/14 1730 117/83 mmHg - - 94 - 100 % - -  08/21/14 1711 - - - 88 - 100 % - -  08/21/14 1640 120/84 mmHg 99.4 F (37.4 C) Oral 103 18 99 %  (1.626 m) 108 lb (48.988 kg)    20:45- . CT case findings discussed with radiologist, Dr. Manus Gunning  20:46- Ordered blood cultures, and lactate  8:50 PM Reevaluation with update and discussion. After initial assessment and treatment, an updated evaluation reveals she is somewhat more comfortable at this time. She continues to be able to talk with a normal sounding voice. At this time she is controlling her secretions. Elizabeth Fisher   21:03- case findings discussed with ENT, Dr. Pollyann Kennedy. He will evaluate the CT, and contact me with recommendations.  21:20- Dr. Pollyann Kennedy has reviewed the CT and states that the airway is not compromised. He feels like the patient to be admitted, to be placed on IV antibiotics, and he will evaluate the patient in the morning. He will consider watching her for a couple of days to see if she improves without intervention. She may ultimately need esophagoscopy with abscess drainage. These findings were  communicated with the patient. She has no additional complaints or concerns at this time.  9:20 PM-Consult complete with Dr. Alvester Morin. Patient case explained and discussed. He agrees to admit patient for further evaluation and treatment. Call ended at 2125  CRITICAL CARE Performed by: Flint Melter Total critical care time: 40 minutes Critical care time was exclusive of separately billable procedures and treating other patients. Critical care was necessary to treat or prevent imminent or life-threatening deterioration. Critical care was time spent personally by me on the following activities: development of treatment plan with patient and/or surrogate as well as nursing, discussions with consultants, evaluation of patient's response to treatment, examination of patient, obtaining history from patient or surrogate, ordering and performing treatments and interventions, ordering and review of laboratory studies, ordering and review of radiographic studies, pulse oximetry and re-evaluation of  patient's condition.  Labs Review Labs Reviewed  COMPREHENSIVE METABOLIC PANEL - Abnormal; Notable for the following:    GFR calc non Af Amer 78 (*)    Anion gap 17 (*)    All other components within normal limits  CBC WITH DIFFERENTIAL/PLATELET - Abnormal; Notable for the following:    RBC 5.37 (*)    Hemoglobin 15.2 (*)    Neutrophils Relative % 85 (*)    Lymphocytes Relative 9 (*)    All other components within normal limits  URINALYSIS, ROUTINE W REFLEX MICROSCOPIC - Abnormal; Notable for the following:    Color, Urine AMBER (*)    APPearance CLOUDY (*)    Specific Gravity, Urine 1.036 (*)    Bilirubin Urine SMALL (*)    Ketones, ur 40 (*)    Protein, ur 100 (*)    Leukocytes, UA SMALL (*)    All other components within normal limits  URINE MICROSCOPIC-ADD ON - Abnormal; Notable for the following:    Bacteria, UA MANY (*)    All other components within normal limits  CULTURE, BLOOD (ROUTINE X  2)  CULTURE, BLOOD (ROUTINE X 2)  POC URINE PREG, ED  I-STAT CG4 LACTIC ACID, ED    Imaging Review Ct Soft Tissue Neck W Contrast  08/21/2014   CLINICAL DATA:  Dysphasia, pain with swallowing. Symptoms for 4 days.  EXAM: CT NECK WITH CONTRAST  TECHNIQUE: Multidetector CT imaging of the neck was performed using the standard protocol following the bolus administration of intravenous contrast.  CONTRAST:  80mL OMNIPAQUE IOHEXOL 300 MG/ML  SOLN  COMPARISON:  Radiographs 08/18/2014  FINDINGS: Pharynx and larynx: 16 x 9 x 6 mm fluid collection with internal septation in the subglottic space overlying the left arytenoid cartilage. There is associated edema of the vocal cords and larynx. Palatine tonsils are normal. The epiglottis is normal.  Salivary glands: Symmetric enhancement.  No edema or mass.  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  Lymph nodes: No adenopathy.  Vascular: Normal.  LimSubglottic abscess, without airway compromise. At the time of evaluation.ited intracranial: Normal.  Mastoids and visualized paranasal sinuses: Mastoids are clear. Included paranasal sinuses are clear.  Skeleton: Normal.  Upper chest: Lung apices are clear. The upper mediastinum is normal.  IMPRESSION: 1. Small subglottic fluid collection overlying the left arytenoid cartilage measuring 16 x 9 x 6 mm consistent with abscess. There is associated edema of the larynx and vocal cords. 2. 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 These results were called by telephone at the time of interpretation on 08/21/2014 at 8:44 pm to Dr. Mancel BaleELLIOTT Aniya Jolicoeur , who verbally acknowledged these results.   Electronically Signed   By: Rubye OaksMelanie  Ehinger M.D.   On: 08/21/2014 20:44     EKG Interpretation None      MDM   Final diagnoses:  Dysphagia  Subglottic abscess    Subglottic abscess without airway compromise. At the time of evaluation. Patient will need to be admitted for observation and  treatment.  Nursing Notes Reviewed/ Care Coordinated, and agree without changes. Applicable Imaging Reviewed.  Interpretation of Laboratory Data incorporated into ED treatment  Plan: Admit    Mancel BaleElliott Tynan Boesel, MD 08/21/14 2135

## 2014-08-21 NOTE — ED Notes (Signed)
PT [facility] and difficulty swallowing yesterday.  She states they told her she had a virus and to take ibuprofen.  PT took the ibuprofen and she felt greater difficulty swallowing.  Today pt unable to eat.  Emesis x 6.  Pt states that every time she tries to eat she feels like her throat is closing.

## 2014-08-21 NOTE — Progress Notes (Signed)
Elizabeth RampSidney E Insco [id number] Admission Data: 08/21/2014 10:57 PM Attending Provider: Floydene FlockSteven J Newton, MD WUJ:[id number]:Shamleffer, Madelin RearIbethal J, MD Code Status: Full  Elizabeth Fisher is a 24 y.o. female patient admitted from ED:  -No acute distress noted.  -No complaints of shortness of breath.  -No complaints of chest pain.   Cardiac Monitoring: Box # n/a in place. Cardiac monitor yields:n/a.  Blood pressure 126/92, pulse 60, temperature 97.8 F (36.6 C), temperature source Axillary, resp. rate 16, height 5\' 6"  (1.676 m), weight 43.092 kg (95 lb), last menstrual period 08/15/2014, SpO2 100 %.   IV Fluids:  IV in place, occlusive dsg intact without redness, IV cath antecubital right, condition patent and no redness normal saline.   Allergies:  Other and Shellfish allergy  Past Medical History:   has a past medical history of Sickle cell trait; Sickle cell anemia; and Asthma.  Past Surgical History:   has no past surgical history on file.  Social History:   reports that she has never smoked. She does not have any smokeless tobacco history on file. She reports that she does not drink alcohol or use illicit drugs.  Skin: NSI  Patient/Family orientated to room. Information packet given to patient/family. Admission inpatient armband information verified with patient/family to include name and date of birth and placed on patient arm. Side rails up x 2, fall assessment and education completed with patient/family. Patient/family able to verbalize understanding of risk associated with falls and verbalized understanding to call for assistance before getting out of bed. Call light within reach. Patient/family able to voice and demonstrate understanding of unit orientation instructions.

## 2014-08-22 DIAGNOSIS — J387 Other diseases of larynx: Principal | ICD-10-CM

## 2014-08-22 MED ORDER — DEXAMETHASONE SODIUM PHOSPHATE 10 MG/ML IJ SOLN
10.0000 mg | Freq: Three times a day (TID) | INTRAMUSCULAR | Status: AC
  Administered 2014-08-22 (×2): 10 mg via INTRAVENOUS
  Filled 2014-08-22 (×2): qty 1

## 2014-08-22 MED ORDER — TRAMADOL HCL 50 MG PO TABS
50.0000 mg | ORAL_TABLET | Freq: Four times a day (QID) | ORAL | Status: DC | PRN
  Administered 2014-08-22: 50 mg via ORAL
  Filled 2014-08-22: qty 1

## 2014-08-22 MED ORDER — MENTHOL 3 MG MT LOZG
1.0000 | LOZENGE | OROMUCOSAL | Status: DC | PRN
  Filled 2014-08-22: qty 9

## 2014-08-22 MED ORDER — LIDOCAINE VISCOUS 2 % MT SOLN
60.0000 mL | Freq: Once | OROMUCOSAL | Status: AC | PRN
  Filled 2014-08-22: qty 60

## 2014-08-22 MED ORDER — MORPHINE SULFATE 2 MG/ML IJ SOLN: 1.0000 mg | INTRAMUSCULAR | Status: DC | PRN

## 2014-08-22 MED ORDER — PROMETHAZINE HCL 25 MG/ML IJ SOLN
12.5000 mg | Freq: Four times a day (QID) | INTRAMUSCULAR | Status: DC | PRN
  Administered 2014-08-22: 12.5 mg via INTRAVENOUS
  Filled 2014-08-22: qty 1

## 2014-08-22 MED ORDER — LIDOCAINE VISCOUS 2 % MT SOLN
15.0000 mL | OROMUCOSAL | Status: DC | PRN
  Filled 2014-08-22: qty 15

## 2014-08-22 NOTE — Consult Note (Signed)
Reason for Consult: Sore throat Referring Physician: Hosie Poisson, MD  Elizabeth Fisher is an 24 y.o. female.  HPI: Previously healthy, started having severe sore throat on Sunday and it has been getting worse. It mainly hurts with swallowing. It has not affected her voice or breathing. She feels well otherwise. No prior history of throat problems. No antecedent event or illness.  Past Medical History  Diagnosis Date  . 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 Sickle cell trait   . Exercise-induced asthma     Past Surgical History  Procedure Laterality Date  . No past surgeries      History reviewed. No pertinent family history.  Social History:  reports that she has never smoked. She has never used smokeless tobacco. She reports that she does not drink alcohol or use illicit drugs.  Allergies:  Allergies  Allergen Reactions  . Other Hives and Swelling    Blueberries,peaches and pineapple Swelling of the hands   . Shellfish Allergy Hives    Medications: Reviewed  Results for orders placed or performed during the hospital encounter of 08/21/14 (from the past 48 hour(s))  Comprehensive metabolic panel     Status: Abnormal   Collection Time: 08/21/14  6:50 PM  Result Value Ref Range   Sodium 142 135 - 145 mmol/L   Potassium 3.7 3.5 - 5.1 mmol/L   Chloride 104 96 - 112 mmol/L   CO2 21 19 - 32 mmol/L   Glucose, Bld 91 70 - 99 mg/dL   BUN 15 6 - 23 mg/dL   Creatinine, Ser 1.00 0.50 - 1.10 mg/dL   Calcium 9.8 8.4 - 10.5 mg/dL   Total Protein 8.3 6.0 - 8.3 g/dL   Albumin 4.5 3.5 - 5.2 g/dL   AST 28 0 - 37 U/L   ALT 21 0 - 35 U/L   Alkaline Phosphatase 58 39 - 117 U/L   Total Bilirubin 0.8 0.3 - 1.2 mg/dL   GFR calc non Af Amer 78 (L) >90 mL/min   GFR calc Af Amer >90 >90 mL/min    Comment: (NOTE) The eGFR has been calculated using the CKD EPI equation. This calculation has not been validated in all clinical situations. eGFR's persistently <90 mL/min signify possible  Chronic Kidney Disease.    Anion gap 17 (H) 5 - 15  CBC with Differential     Status: Abnormal   Collection Time: 08/21/14  6:50 PM  Result Value Ref Range   WBC 8.5 4.0 - 10.5 K/uL   RBC 5.37 (H) 3.87 - 5.11 MIL/uL   Hemoglobin 15.2 (H) 12.0 - 15.0 g/dL   HCT 43.3 36.0 - 46.0 %   MCV 80.6 78.0 - 100.0 fL   MCH 28.3 26.0 - 34.0 pg   MCHC 35.1 30.0 - 36.0 g/dL   RDW 12.3 11.5 - 15.5 %   Platelets 263 150 - 400 K/uL   Neutrophils Relative % 85 (H) 43 - 77 %   Lymphocytes Relative 9 (L) 12 - 46 %   Monocytes Relative 6 3 - 12 %   Eosinophils Relative 0 0 - 5 %   Basophils Relative 0 0 - 1 %   Neutro Abs 7.2 1.7 - 7.7 K/uL   Lymphs Abs 0.8 0.7 - 4.0 K/uL   Monocytes Absolute 0.5 0.1 - 1.0 K/uL   Eosinophils Absolute 0.0 0.0 - 0.7 K/uL   Basophils Absolute 0.0 0.0 - 0.1 K/uL   WBC Morphology ATYPICAL LYMPHOCYTES     Comment:  TOXIC GRANULATION VACUOLATED NEUTROPHILS    Smear Review LARGE PLATELETS PRESENT   Urinalysis, Routine w reflex microscopic     Status: Abnormal   Collection Time: 08/21/14  7:20 PM  Result Value Ref Range   Color, Urine AMBER (A) YELLOW    Comment: BIOCHEMICALS MAY BE AFFECTED BY COLOR   APPearance CLOUDY (A) CLEAR   Specific Gravity, Urine 1.036 (H) 1.005 - 1.030   pH 5.0 5.0 - 8.0   Glucose, UA NEGATIVE NEGATIVE mg/dL   Hgb urine dipstick NEGATIVE NEGATIVE   Bilirubin Urine SMALL (A) NEGATIVE   Ketones, ur 40 (A) NEGATIVE mg/dL   Protein, ur 100 (A) NEGATIVE mg/dL   Urobilinogen, UA 1.0 0.0 - 1.0 mg/dL   Nitrite NEGATIVE NEGATIVE   Leukocytes, UA SMALL (A) NEGATIVE  Urine microscopic-add on     Status: Abnormal   Collection Time: 08/21/14  7:20 PM  Result Value Ref Range   Squamous Epithelial / LPF RARE RARE   WBC, UA 3-6 <3 WBC/hpf   RBC / HPF 0-2 <3 RBC/hpf   Bacteria, UA MANY (A) RARE   Urine-Other MUCOUS PRESENT   POC Urine Pregnancy, ED (do NOT order at [facility])     Status: None   Collection Time: 08/21/14  7:36 PM  Result Value Ref Range    Preg Test, Ur NEGATIVE NEGATIVE    Comment:        THE SENSITIVITY OF THIS METHODOLOGY IS >24 mIU/mL   I-Stat CG4 Lactic Acid, ED     Status: None   Collection Time: 08/21/14  9:49 PM  Result Value Ref Range   Lactic Acid, Venous 1.51 0.5 - 2.0 mmol/L    Ct Soft Tissue Neck W Contrast  08/21/2014   CLINICAL DATA:  Dysphasia, pain with swallowing. Symptoms for 4 days.  EXAM: CT NECK WITH CONTRAST  TECHNIQUE: Multidetector CT imaging of the neck was performed using the standard protocol following the bolus administration of intravenous contrast.  CONTRAST:  48m OMNIPAQUE IOHEXOL 300 MG/ML  SOLN  COMPARISON:  Radiographs 08/18/2014  FINDINGS: Pharynx and larynx: 16 x 9 x 6 mm fluid collection with internal septation in the subglottic space overlying the left arytenoid cartilage. There is associated edema of the vocal cords and larynx. Palatine tonsils are normal. The epiglottis is normal.  Salivary glands: Symmetric enhancement.  No edema or mass.  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  Lymph nodes: No adenopathy.  Vascular: Normal.  Limited intracranial: Normal.  Mastoids and visualized paranasal sinuses: Mastoids are clear. Included paranasal sinuses are clear.  Skeleton: Normal.  Upper chest: Lung apices are clear. The upper mediastinum is normal.  IMPRESSION: 1. Small subglottic fluid collection overlying the left arytenoid cartilage measuring 16 x 9 x 6 mm consistent with abscess. There is associated edema of the larynx and vocal cords. 2. 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 These results were called by telephone at the time of interpretation on 08/21/2014 at 8:44 pm to Dr. EDaleen Bo, who verbally acknowledged these results.   Electronically Signed   By: MJeb LeveringM.D.   On: 08/21/2014 20:44    RGLO:VFIEPPIRexcept as listed in admit H&P  Blood pressure 123/65, pulse 69, temperature 98.2 F (36.8 C), temperature source Oral, resp. rate 17,  height 5' 6"  (1.676 m), weight 43.092 kg (95 lb), last menstrual period 08/15/2014, SpO2 100 %.  PHYSICAL EXAM: Overall appearance:  Healthy appearing, in no distress, normal  voice and no stridor Head:  Normocephalic, atraumatic. Ears: External ears look normal. Nose: External nose is healthy in appearance. Internal nasal exam free of any lesions or obstruction. Oral Cavity:  There are no mucosal lesions or masses identified. Oral Pharynx/Hypopharynx/Larynx: no signs of any mucosal lesions or masses identified.  Larynx/Hypopharynx: Normal larynx by fiberoptic exam, erythema and some swelling of the post cricoid/esophageal introitus mucosa. There is no pooling secretions. The cords move well. There is no subglottic abnormality. Neuro:  No identifiable neurologic deficits. Neck: No palpable neck masses.  Studies Reviewed: CT reviewed. This was called a subglottic abscess but it is more consistent with a post cricoid abnormality either cyst or abscess.  Procedures: Fiberoptic laryngoscopy. Topical viscous lidocaine was applied to the nasal cavities. The scope was passed easily through the left side. Findings are described in the above physical exam. She tolerated this well.   Assessment/Plan: Swelling of the esophageal introitus. CT evidence of a possible cyst or abscess. I'm unable to visualize the exact area with fiberoptic exam. Rigid cervical esophagoscopy would be needed under anesthesia to directly view this area. She is afebrile and does not have a white blood cell count elevation. Is possible this could be a viral infection. Recommend wait and see if you days if it starts to resolve. If it does not or if she gets worse, then surgical esophagoscopy would be the next step. I would recommend she start on a soft and liquid diet as tolerated. Keep in the hospital until she is able to swallow adequately.  Elizabeth Fisher 08/22/2014, 11:09 AM

## 2014-08-22 NOTE — Progress Notes (Signed)
Nutrition Brief Note  Patient identified on the Malnutrition Screening Tool (MST) Report  Wt Readings from Last 15 Encounters:  08/21/14 95 lb (43.092 kg)  08/18/14 108 lb (48.988 kg)   This is a 24 y.o. year old female with no significant past medical history presenting with sub glottic abscess. Pt reports worsening painful swallowing over the past week. Pt denies any recent infections. Non smoker. No known recent sick contacts. Worsening sore throat and hoarseness over last 24 hours. Denies any trismus, SOB, drooling. Was seen in ER earlier and sent home. Negative strep test. Had worsening sxs. CT neck done in ER showing sub-glottic abscess.   Pt sleeping soundly at time of visit. She has just been advanced to a clear liquid diet for lunch; no PO intake as of yet. Noted inconsistencies with both wt and ht in EMR. Exam revealed no signs of fat or muscle depletion, however, noted that pt is small framed.   Body mass index is 15.34 kg/(m^2). Patient meets criteria for underweight based on current BMI.   Current diet order is clear liquid, patient is consuming approximately n/a% of meals at this time. Labs and medications reviewed.   No nutrition interventions warranted at this time. If nutrition issues arise, please consult RD.   Elizabeth Fisher, RD, LDN, CDE Pager: [phone] After hours Pager: [phone]

## 2014-08-22 NOTE — Progress Notes (Signed)
[facility] PROGRESS NOTE  Elmer RampSidney E Knudtson ZOX:[id number] DOB: 07/27/1990 DOA: 08/21/2014 PCP: Pcp Not In System Interim summary: 24 year old lady admitted for throat pain and was found to have sub glottic abscess.  Assessment/Plan: 1. Swelling of the esophageal introitus/ ? Small sub glottic abscess: Admitted for IV antibiotics. She is currently afebrile ad there is no leukocytosis. Streptococcal test is negative.  Blood cultures done and pending. Normal lactic acid level.  ENT consulted and recommendations given.  Monitor on pain meds and IV antibiotics.  Started her on clear liquid diet as recommended , plan to advance diet as tolerated.    Code Status: full code Family Communication: none at bedside Disposition Plan: pending    Consultants:  ENT  Procedures: fiberoptic exam  Antibiotics:  clindamycin  HPI/Subjective: Reports throat pain  Objective: Filed Vitals:   08/22/14 1448  BP: 106/68  Pulse: 82  Temp: 98.8 F (37.1 C)  Resp: 16    Intake/Output Summary (Last 24 hours) at 08/22/14 1530 Last data filed at 08/22/14 1450  Gross per 24 hour  Intake 1671.67 ml  Output   1200 ml  Net 471.67 ml   Filed Weights   08/21/14 1640 08/21/14 2248  Weight: 48.988 kg (108 lb) 43.092 kg (95 lb)    Exam:   General:  Alert afebrile comfortable  Cardiovascular: s1s2  Respiratory: clear to auscultation, no wheezing or rhonchi  Abdomen: soft non tender non distended bowel sounds heard  Musculoskeletal: no pedal edema.   Data Reviewed: Basic Metabolic Panel:  Recent Labs Lab 08/21/14 1850  NA 142  K 3.7  CL 104  CO2 21  GLUCOSE 91  BUN 15  CREATININE 1.00  CALCIUM 9.8   Liver Function Tests:  Recent Labs Lab 08/21/14 1850  AST 28  ALT 21  ALKPHOS 58  BILITOT 0.8  PROT 8.3  ALBUMIN 4.5   No results for input(s): LIPASE, AMYLASE in the last 168 hours. No results for input(s): AMMONIA in the last 168 hours. CBC:  Recent Labs Lab  08/21/14 1850  WBC 8.5  NEUTROABS 7.2  HGB 15.2*  HCT 43.3  MCV 80.6  PLT 263   Cardiac Enzymes: No results for input(s): CKTOTAL, CKMB, CKMBINDEX, TROPONINI in the last 168 hours. BNP (last 3 results) No results for input(s): BNP in the last 8760 hours.  ProBNP (last 3 results) No results for input(s): PROBNP in the last 8760 hours.  CBG: No results for input(s): GLUCAP in the last 168 hours.  Recent Results (from the past 240 hour(s))  Rapid strep screen     Status: None   Collection Time: 08/18/14  7:29 AM  Result Value Ref Range Status   Streptococcus, Group A Screen (Direct) NEGATIVE NEGATIVE Final    Comment: (NOTE) A Rapid Antigen test may result negative if the antigen level in the sample is below the detection level of this test. The FDA has not cleared this test as a stand-alone test therefore the rapid antigen negative result has reflexed to a Group A Strep culture.   Culture, Group A Strep     Status: None   Collection Time: 08/18/14  7:29 AM  Result Value Ref Range Status   Strep A Culture Negative  Final    Comment: (NOTE) Performed At: [facility] [address] [place], [place] [id number] Mila HomerHancock William F MD NF:[phone]      Studies: Ct Soft Tissue Neck W Contrast  08/21/2014   CLINICAL DATA:  Dysphasia, pain  with swallowing. Symptoms for 4 days.  EXAM: CT NECK WITH CONTRAST  TECHNIQUE: Multidetector CT imaging of the neck was performed using the standard protocol following the bolus administration of intravenous contrast.  CONTRAST:  80mL OMNIPAQUE IOHEXOL 300 MG/ML  SOLN  COMPARISON:  Radiographs 08/18/2014  FINDINGS: Pharynx and larynx: 16 x 9 x 6 mm fluid collection with internal septation in the subglottic space overlying the left arytenoid cartilage. There is associated edema of the vocal cords and larynx. Palatine tonsils are normal. The epiglottis is normal.  Salivary glands: Symmetric enhancement.  No edema or mass.  Thyroid: Tiny 2-3 mm  cysts in the left lobe consistent with colloid cysts.  Lymph nodes: No adenopathy.  Vascular: Normal.  Limited intracranial: Normal.  Mastoids and visualized paranasal sinuses: Mastoids are clear. Included paranasal sinuses are clear.  Skeleton: Normal.  Upper chest: Lung apices are clear. The upper mediastinum is normal.  IMPRESSION: 1. Small subglottic fluid collection overlying the left arytenoid cartilage measuring 16 x 9 x 6 mm consistent with abscess. There is associated edema of the larynx and vocal cords. 2. 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 These results were called by telephone at the time of interpretation on 08/21/2014 at 8:44 pm to Dr. Mancel Bale , who verbally acknowledged these results.   Electronically Signed   By: Rubye Oaks M.D.   On: 08/21/2014 20:44    Scheduled Meds: . clindamycin (CLEOCIN) IV  900 mg Intravenous 4 times per day  . heparin  5,000 Units Subcutaneous 3 times per day   Continuous Infusions: . sodium chloride 100 mL/hr at 08/21/14 2257    Active Problems:   Subglottic abscess    Time spent: 25 minutes.    [facility]  [facility] Pager [phone] If 7PM-7AM, please contact night-coverage at [email or web address], password [facility] 08/22/2014, 3:30 PM  LOS: 0 days

## 2014-08-23 NOTE — Progress Notes (Signed)
[facility] PROGRESS NOTE  Elizabeth Fisher EAV:[id number] DOB: 1991/01/01 DOA: 08/21/2014 PCP: Pcp Not In System Interim summary: 24 year old lady admitted for throat pain and was found to have sub glottic abscess.  Assessment/Plan: 1. Swelling of the esophageal introitus/ ? Small sub glottic abscess: Admitted for IV antibiotics. She is currently afebrile ad there is no leukocytosis. Streptococcal test is negative.  Blood cultures done and pending. Normal lactic acid level.  ENT consulted and recommendations given.  Monitor on pain meds and IV antibiotics.  Started her on clear liquid diet as recommended , plan to advance diet as tolerated.  Her throat pain is resolved, advanced diet.    Code Status: full code Family Communication: none at bedside Disposition Plan: pending    Consultants:  ENT  Procedures: fiberoptic exam  Antibiotics:  clindamycin  HPI/Subjective: Throat pain resolved Objective: Filed Vitals:   08/23/14 1632  BP: 119/83  Pulse: 86  Temp: 97.8 F (36.6 C)  Resp: 16    Intake/Output Summary (Last 24 hours) at 08/23/14 1806 Last data filed at 08/23/14 1330  Gross per 24 hour  Intake 3185.67 ml  Output   1500 ml  Net 1685.67 ml   Filed Weights   08/21/14 1640 08/21/14 2248  Weight: 48.988 kg (108 lb) 43.092 kg (95 lb)    Exam:   General:  Alert afebrile comfortable  Cardiovascular: s1s2  Respiratory: clear to auscultation, no wheezing or rhonchi  Abdomen: soft non tender non distended bowel sounds heard  Musculoskeletal: no pedal edema.   Data Reviewed: Basic Metabolic Panel:  Recent Labs Lab 08/21/14 1850  NA 142  K 3.7  CL 104  CO2 21  GLUCOSE 91  BUN 15  CREATININE 1.00  CALCIUM 9.8   Liver Function Tests:  Recent Labs Lab 08/21/14 1850  AST 28  ALT 21  ALKPHOS 58  BILITOT 0.8  PROT 8.3  ALBUMIN 4.5   No results for input(s): LIPASE, AMYLASE in the last 168 hours. No results for input(s): AMMONIA in  the last 168 hours. CBC:  Recent Labs Lab 08/21/14 1850  WBC 8.5  NEUTROABS 7.2  HGB 15.2*  HCT 43.3  MCV 80.6  PLT 263   Cardiac Enzymes: No results for input(s): CKTOTAL, CKMB, CKMBINDEX, TROPONINI in the last 168 hours. BNP (last 3 results) No results for input(s): BNP in the last 8760 hours.  ProBNP (last 3 results) No results for input(s): PROBNP in the last 8760 hours.  CBG: No results for input(s): GLUCAP in the last 168 hours.  Recent Results (from the past 240 hour(s))  Rapid strep screen     Status: None   Collection Time: 08/18/14  7:29 AM  Result Value Ref Range Status   Streptococcus, Group A Screen (Direct) NEGATIVE NEGATIVE Final    Comment: (NOTE) A Rapid Antigen test may result negative if the antigen level in the sample is below the detection level of this test. The FDA has not cleared this test as a stand-alone test therefore the rapid antigen negative result has reflexed to a Group A Strep culture.   Culture, Group A Strep     Status: None   Collection Time: 08/18/14  7:29 AM  Result Value Ref Range Status   Strep A Culture Negative  Final    Comment: (NOTE) Performed At: [facility] [address] [place], [place] [id number] Mila Homer MD YQ:[id number]   Culture, blood (routine x 2)     Status: None (Preliminary  result)   Collection Time: 08/21/14  9:39 PM  Result Value Ref Range Status   Specimen Description BLOOD RIGHT FOREARM  Final   Special Requests BOTTLES DRAWN AEROBIC AND ANAEROBIC 5ML EACH  Final   Culture   Final           BLOOD CULTURE RECEIVED NO GROWTH TO DATE CULTURE WILL BE HELD FOR 5 DAYS BEFORE ISSUING A FINAL NEGATIVE REPORT [facility]    Report Status PENDING  Incomplete  Culture, blood (routine x 2)     Status: None (Preliminary result)   Collection Time: 08/21/14  9:49 PM  Result Value Ref Range Status   Specimen Description BLOOD RIGHT ANTECUBITAL  Final   Special Requests  BOTTLES DRAWN AEROBIC AND ANAEROBIC 5ML EACH  Final   Culture   Final           BLOOD CULTURE RECEIVED NO GROWTH TO DATE CULTURE WILL BE HELD FOR 5 DAYS BEFORE ISSUING A FINAL NEGATIVE REPORT Performed at [facility]    Report Status PENDING  Incomplete     Studies: Ct Soft Tissue Neck W Contrast  08/21/2014   CLINICAL DATA:  Dysphasia, pain with swallowing. Symptoms for 4 days.  EXAM: CT NECK WITH CONTRAST  TECHNIQUE: Multidetector CT imaging of the neck was performed using the standard protocol following the bolus administration of intravenous contrast.  CONTRAST:  80mL OMNIPAQUE IOHEXOL 300 MG/ML  SOLN  COMPARISON:  Radiographs 08/18/2014  FINDINGS: Pharynx and larynx: 16 x 9 x 6 mm fluid collection with internal septation in the subglottic space overlying the left arytenoid cartilage. There is associated edema of the vocal cords and larynx. Palatine tonsils are normal. The epiglottis is normal.  Salivary glands: Symmetric enhancement.  No edema or mass.  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  Lymph nodes: No adenopathy.  Vascular: Normal.  Limited intracranial: Normal.  Mastoids and visualized paranasal sinuses: Mastoids are clear. Included paranasal sinuses are clear.  Skeleton: Normal.  Upper chest: Lung apices are clear. The upper mediastinum is normal.  IMPRESSION: 1. Small subglottic fluid collection overlying the left arytenoid cartilage measuring 16 x 9 x 6 mm consistent with abscess. There is associated edema of the larynx and vocal cords. 2. 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 These results were called by telephone at the time of interpretation on 08/21/2014 at 8:44 pm to Dr. Mancel BaleELLIOTT WENTZ , who verbally acknowledged these results.   Electronically Signed   By: Rubye OaksMelanie  Ehinger M.D.   On: 08/21/2014 20:44    Scheduled Meds: . clindamycin (CLEOCIN) IV  900 mg Intravenous 4 times per day  . heparin  5,000 Units Subcutaneous 3 times per day    Continuous Infusions: . sodium chloride 100 mL/hr at 08/23/14 1430    Active Problems:   Subglottic abscess    Time spent: 25 minutes.    [facility],Zuleyma Scharf  [facility] Pager [phone] If 7PM-7AM, please contact night-coverage at [email or web address], password [email or web address] 08/23/2014, 6:06 PM  LOS: 1 day

## 2014-08-23 NOTE — Plan of Care (Signed)
Problem: Phase I Progression Outcomes Goal: Initial discharge plan identified Outcome: Completed/Met Date Met:  08/23/14 To return home with family

## 2014-08-24 MED ORDER — CLINDAMYCIN HCL 300 MG PO CAPS: 300.0000 mg | ORAL_CAPSULE | Freq: Four times a day (QID) | ORAL | Status: DC

## 2014-08-24 MED ORDER — TRAMADOL HCL 50 MG PO TABS: 50.0000 mg | ORAL_TABLET | Freq: Four times a day (QID) | ORAL | Status: DC | PRN

## 2014-08-24 NOTE — Progress Notes (Signed)
Elizabeth RampSidney E Sennett discharged Home per MD order.  Discharge instructions reviewed and discussed with the patient, all questions and concerns answered. Copy of instructions, care note &  and scripts given to patient.    Medication List    STOP taking these medications        cephALEXin 500 MG capsule  Commonly known as:  KEFLEX     ibuprofen 200 MG tablet  Commonly known as:  ADVIL,MOTRIN     omeprazole 20 MG capsule  Commonly known as:  PRILOSEC     ondansetron 4 MG disintegrating tablet  Commonly known as:  ZOFRAN ODT      TAKE these medications        clindamycin 300 MG capsule  Commonly known as:  CLEOCIN  Take 1 capsule (300 mg total) by mouth 4 (four) times daily.     traMADol 50 MG tablet  Commonly known as:  ULTRAM  Take 1 tablet (50 mg total) by mouth every 6 (six) hours as needed for severe pain.        Patients skin is clean, dry and intact, no evidence of skin break down. IV site discontinued and catheter remains intact. Site without signs and symptoms of complications. Dressing and pressure applied.  Patient escorted to car by NT ambulating,  no distress noted upon discharge.  Laural BenesJohnson, Elizabeth Fisher 08/24/2014 3:08 PM

## 2014-08-24 NOTE — Discharge Summary (Signed)
Physician Discharge Summary  Elizabeth RampSidney E Grieb MWU:[id number] DOB: 10/08/1990 DOA: 08/21/2014  PCP: Pcp Not In System  Admit date: 08/21/2014 Discharge date: 08/24/2014  Time spent: 30 minutes  Recommendations for Outpatient Follow-up:  1. Follow up with Dr Pollyann Kennedyosen in one week.   Discharge Diagnoses:  Active Problems:   Subglottic abscess   Discharge Condition: improved  Diet recommendation: regular diet  Filed Weights   08/21/14 1640 08/21/14 2248  Weight: 48.988 kg (108 lb) 43.092 kg (95 lb)    History of present illness:  24 year old lady admitted for throat pain and was found to have sub glottic abscess.   Hospital Course:  1. Swelling of the esophageal introitus/ ? Small sub glottic abscess: Admitted for IV antibiotics. She is currently afebrile ad there is no leukocytosis. Streptococcal test is negative.  Blood cultures done and NEGATIVE so far. Normal lactic acid level.  ENT consulted and recommendations given.  She received 4 days of IV antibiotics , her difficulty and pain during swallowing compeltely resolved and she is on regular diet. Recommend outpatient follow up with Dr Pollyann Kennedyosen in one week before the antibiotics are done.   Procedures:  NONE  Consultations:  ENT  Discharge Exam: Filed Vitals:   08/24/14 1401  BP: 123/70  Pulse: 86  Temp: 98.6 F (37 C)  Resp: 16    General: alert afebrile comfortable Cardiovascular: s1s2 Respiratory: ctab  Discharge Instructions   Discharge Instructions    Discharge instructions    Complete by:  As directed   Follow upw ith Dr Pollyann Kennedyosen in one week. Before the antibiotics are finished.          Discharge Medication List as of 08/24/2014  2:49 PM    START taking these medications   Details  clindamycin (CLEOCIN) 300 MG capsule Take 1 capsule (300 mg total) by mouth 4 (four) times daily., Starting 08/24/2014, Until Discontinued, Print    traMADol (ULTRAM) 50 MG tablet Take 1 tablet (50 mg total) by mouth every  6 (six) hours as needed for severe pain., Starting 08/24/2014, Until Discontinued, Print      STOP taking these medications     ibuprofen (ADVIL,MOTRIN) 200 MG tablet      cephALEXin (KEFLEX) 500 MG capsule      omeprazole (PRILOSEC) 20 MG capsule      ondansetron (ZOFRAN ODT) 4 MG disintegrating tablet        Allergies  Allergen Reactions  . Other Hives and Swelling    Blueberries,peaches and pineapple Swelling of the hands   . Shellfish Allergy Hives   Follow-up Information    Follow up with Serena ColonelOSEN, JEFRY, MD. Schedule an appointment as soon as possible for a visit in 1 week.   Specialty:  Otolaryngology   Contact information:   [address] [place] [address] [phone]        The results of significant diagnostics from this hospitalization (including imaging, microbiology, ancillary and laboratory) are listed below for reference.    Significant Diagnostic Studies: Dg Neck Soft Tissue  08/18/2014   CLINICAL DATA:  Sore throat and difficulty swallowing since 08/17/2014.  EXAM: NECK SOFT TISSUES - 1+ VIEW  COMPARISON:  None.  FINDINGS: There is no evidence of retropharyngeal soft tissue swelling or epiglottic enlargement. The cervical airway is unremarkable and no radio-opaque foreign body identified.  IMPRESSION: Negative exam.   Electronically Signed   By: Drusilla Kannerhomas  Dalessio M.D.   On: 08/18/2014 07:29   Ct Soft  Tissue Neck W Contrast  08/21/2014   CLINICAL DATA:  Dysphasia, pain with swallowing. Symptoms for 4 days.  EXAM: CT NECK WITH CONTRAST  TECHNIQUE: Multidetector CT imaging of the neck was performed using the standard protocol following the bolus administration of intravenous contrast.  CONTRAST:  80mL OMNIPAQUE IOHEXOL 300 MG/ML  SOLN  COMPARISON:  Radiographs 08/18/2014  FINDINGS: Pharynx and larynx: 16 x 9 x 6 mm fluid collection with internal septation in the subglottic space overlying the left arytenoid cartilage. There is associated edema of  the vocal cords and larynx. Palatine tonsils are normal. The epiglottis is normal.  Salivary glands: Symmetric enhancement.  No edema or mass.  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  Lymph nodes: No adenopathy.  Vascular: Normal.  Limited intracranial: Normal.  Mastoids and visualized paranasal sinuses: Mastoids are clear. Included paranasal sinuses are clear.  Skeleton: Normal.  Upper chest: Lung apices are clear. The upper mediastinum is normal.  IMPRESSION: 1. Small subglottic fluid collection overlying the left arytenoid cartilage measuring 16 x 9 x 6 mm consistent with abscess. There is associated edema of the larynx and vocal cords. 2. 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 These results were called by telephone at the time of interpretation on 08/21/2014 at 8:44 pm to Dr. Mancel Bale , who verbally acknowledged these results.   Electronically Signed   By: Rubye Oaks M.D.   On: 08/21/2014 20:44    Microbiology: Recent Results (from the past 240 hour(s))  Rapid strep screen     Status: None   Collection Time: 08/18/14  7:29 AM  Result Value Ref Range Status   Streptococcus, Group A Screen (Direct) NEGATIVE NEGATIVE Final    Comment: (NOTE) A Rapid Antigen test may result negative if the antigen level in the sample is below the detection level of this test. The FDA has not cleared this test as a stand-alone test therefore the rapid antigen negative result has reflexed to a Group A Strep culture.   Culture, Group A Strep     Status: None   Collection Time: 08/18/14  7:29 AM  Result Value Ref Range Status   Strep A Culture Negative  Final    Comment: (NOTE) Performed At: [facility] [address] [place], [place] [id number] Mila Homer MD NW:[id number]   Culture, blood (routine x 2)     Status: None (Preliminary result)   Collection Time: 08/21/14  9:39 PM  Result Value Ref Range Status   Specimen Description BLOOD RIGHT  FOREARM  Final   Special Requests BOTTLES DRAWN AEROBIC AND ANAEROBIC EACH  Final   Culture   Final           BLOOD CULTURE RECEIVED NO GROWTH TO DATE CULTURE WILL BE HELD FOR 5 DAYS BEFORE ISSUING A FINAL NEGATIVE REPORT Performed at [facility]    Report Status PENDING  Incomplete  Culture, blood (routine x 2)     Status: None (Preliminary result)   Collection Time: 08/21/14  9:49 PM  Result Value Ref Range Status   Specimen Description BLOOD RIGHT ANTECUBITAL  Final   Special Requests BOTTLES DRAWN AEROBIC AND ANAEROBIC EACH  Final   Culture   Final           BLOOD CULTURE RECEIVED NO GROWTH TO DATE CULTURE WILL BE HELD FOR 5 DAYS BEFORE ISSUING A FINAL NEGATIVE REPORT Performed at [facility]    Report  Status PENDING  Incomplete     Labs: Basic Metabolic Panel:  Recent Labs Lab 08/21/14 1850  NA 142  K 3.7  CL 104  CO2 21  GLUCOSE 91  BUN 15  CREATININE 1.00  CALCIUM 9.8   Liver Function Tests:  Recent Labs Lab 08/21/14 1850  AST 28  ALT 21  ALKPHOS 58  BILITOT 0.8  PROT 8.3  ALBUMIN 4.5   No results for input(s): LIPASE, AMYLASE in the last 168 hours. No results for input(s): AMMONIA in the last 168 hours. CBC:  Recent Labs Lab 08/21/14 1850  WBC 8.5  NEUTROABS 7.2  HGB 15.2*  HCT 43.3  MCV 80.6  PLT 263   Cardiac Enzymes: No results for input(s): CKTOTAL, CKMB, CKMBINDEX, TROPONINI in the last 168 hours. BNP: BNP (last 3 results) No results for input(s): BNP in the last 8760 hours.  ProBNP (last 3 results) No results for input(s): PROBNP in the last 8760 hours.  CBG: No results for input(s): GLUCAP in the last 168 hours.     SignedKathlen Mody  [facility] 08/24/2014, 6:13 PM

## 2014-08-28 LAB — CULTURE, BLOOD (ROUTINE X 2)
Culture: NO GROWTH
Culture: NO GROWTH

## 2015-06-07 ENCOUNTER — Emergency Department (HOSPITAL_COMMUNITY)
Admission: EM | Admit: 2015-06-07 | Discharge: 2015-06-07 | Disposition: A | Discharge status: Home / Self Care | Payer: 59 | Attending: Emergency Medicine | Admitting: Emergency Medicine

## 2015-06-07 ENCOUNTER — Encounter (HOSPITAL_COMMUNITY): Payer: Self-pay | Admitting: Adult Health

## 2015-06-07 DIAGNOSIS — J029 Acute pharyngitis, unspecified: Secondary | ICD-10-CM | POA: Insufficient documentation

## 2015-06-07 DIAGNOSIS — Z862 Personal history of diseases of the blood and blood-forming organs and certain disorders involving the immune mechanism: Secondary | ICD-10-CM | POA: Insufficient documentation

## 2015-06-07 DIAGNOSIS — Z792 Long term (current) use of antibiotics: Secondary | ICD-10-CM | POA: Insufficient documentation

## 2015-06-07 DIAGNOSIS — J45909 Unspecified asthma, uncomplicated: Secondary | ICD-10-CM | POA: Insufficient documentation

## 2015-06-07 MED ORDER — DEXAMETHASONE SODIUM PHOSPHATE 10 MG/ML IJ SOLN
10.0000 mg | Freq: Once | INTRAMUSCULAR | Status: AC
  Administered 2015-06-07: 10 mg via INTRAMUSCULAR
  Filled 2015-06-07: qty 1

## 2015-06-07 MED ORDER — KETOROLAC TROMETHAMINE 60 MG/2ML IM SOLN
60.0000 mg | Freq: Once | INTRAMUSCULAR | Status: AC
  Administered 2015-06-07: 60 mg via INTRAMUSCULAR
  Filled 2015-06-07: qty 2

## 2015-06-07 NOTE — ED Notes (Signed)
Presents with sore throat that is painful to swallow-states, "I have had this before and it was a subglottic abscess at that time. This feels the same" denies fevers. Sore throat began Wednesday.

## 2015-06-07 NOTE — ED Provider Notes (Signed)
CSN: [id number]     Arrival date & time 06/07/15  0259 History  By signing my name below, I, Freida Busman, attest that this documentation has been prepared under the direction and in the presence of Tomasita Crumble, MD . Electronically Signed: Freida Busman, Scribe. 06/07/2015. 3:25 AM.      Chief Complaint  Patient presents with  . Sore Throat    The history is provided by the patient. No language interpreter was used.    HPI Comments:  Elizabeth Fisher is a 25 y.o. female who presents to the Emergency Department complaining of sore throat x 4 days worse in the last day. Pt notes pain is exacerbated when swallowing and notes her neck in tender to touch. She notes pain today is similar to pain felt when diagnosed with subglottic abscess ~ 1 year ago. She reports associated congestion and rhinorrhea. She denies fever. Pt notes recent sick contacts with toddler with cold symptoms. No alleviating factors noted; no treatments tried PTA.   Past Medical History  Diagnosis Date  . 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 Sickle cell trait (HCC)   . Exercise-induced asthma    Past Surgical History  Procedure Laterality Date  . No past surgeries     History reviewed. No pertinent family history. Social History  Substance Use Topics  . Smoking status: Never Smoker   . Smokeless tobacco: Never Used  . Alcohol Use: No   OB History    No data available     Review of Systems  10 systems reviewed and all are negative for acute change except as noted in the HPI.    Allergies  Other and Shellfish allergy  Home Medications   Prior to Admission medications   Medication Sig Start Date End Date Taking? Authorizing Provider  clindamycin (CLEOCIN) 300 MG capsule Take 1 capsule (300 mg total) by mouth 4 (four) times daily. 08/24/14   Kathlen Mody, MD  traMADol (ULTRAM) 50 MG tablet Take 1 tablet (50 mg total) by mouth every 6 (six) hours as needed for severe pain. 08/24/14   Kathlen Mody, MD    BP 129/85 mmHg  Pulse 98  Temp(Src) 98.9 F (37.2 C)  Resp 16  SpO2 100%  LMP 05/14/2015 (Exact Date) Physical Exam  Constitutional: She is oriented to person, place, and time. She appears well-developed and well-nourished. No distress.  HENT:  Head: Normocephalic and atraumatic.  Nose: Nose normal.  Mouth/Throat: Oropharynx is clear and moist. No oropharyngeal exudate.  No evidence of oral swelling  Eyes: Conjunctivae and EOM are normal. Pupils are equal, round, and reactive to light. No scleral icterus.  Neck: Normal range of motion. Neck supple. No JVD present. No tracheal deviation present. No thyromegaly present.  Cardiovascular: Normal rate, regular rhythm and normal heart sounds.  Exam reveals no gallop and no friction rub.   No murmur heard. Pulmonary/Chest: Effort normal and breath sounds normal. No respiratory distress. She has no wheezes. She exhibits no tenderness.  Abdominal: Soft. Bowel sounds are normal. She exhibits no distension and no mass. There is no tenderness. There is no rebound and no guarding.  Musculoskeletal: Normal range of motion. She exhibits no edema or tenderness.  Lymphadenopathy:    She has no cervical adenopathy.  Neurological: She is alert and oriented to person, place, and time. No cranial nerve deficit. She exhibits normal muscle tone.  Skin: Skin is warm and dry. No rash noted. No erythema. No pallor.  Nursing note and vitals reviewed.  ED Course  Procedures   DIAGNOSTIC STUDIES:  Oxygen Saturation is 100% on RA, normal by my interpretation.    COORDINATION OF CARE:  3:08 AM Discussed treatment plan with pt at bedside and pt agreed to plan.    MDM   Final diagnoses:  None    Patient presents to the ED for sore throat for 2 days.  She is concerned she may have another abscess, but there is no evidence of abscess or swelling on my exam.  Her voice is normal and she states she has no sensation of her throat closing or something  pushing on it.  She was given toradol and decadron for treatment.  She appears well and in NAD.  VS remain within her normal limits and she is safe for DC.   I personally performed the services described in this documentation, which was scribed in my presence. The recorded information has been reviewed and is accurate.     Tomasita Crumble, MD 06/07/15 [phone]

## 2015-06-07 NOTE — Discharge Instructions (Signed)
Pharyngitis Elizabeth Fisher, you were given medication to decrease the swelling and treat your sore throat.  See a primary care doctor within 3 days for continued care. If symptoms worsen, come back to the ED immediately.  Thank you. Pharyngitis is a sore throat (pharynx). There is redness, pain, and swelling of your throat. HOME CARE   Drink enough fluids to keep your pee (urine) clear or pale yellow.  Only take medicine as told by your doctor.  You may get sick again if you do not take medicine as told. Finish your medicines, even if you start to feel better.  Do not take aspirin.  Rest.  Rinse your mouth (gargle) with salt water ( tsp of salt per 1 qt of water) every 1-2 hours. This will help the pain.  If you are not at risk for choking, you can suck on hard candy or sore throat lozenges. GET HELP IF:  You have large, tender lumps on your neck.  You have a rash.  You cough up green, yellow-brown, or bloody spit. GET HELP RIGHT AWAY IF:   You have a stiff neck.  You drool or cannot swallow liquids.  You throw up (vomit) or are not able to keep medicine or liquids down.  You have very bad pain that does not go away with medicine.  You have problems breathing (not from a stuffy nose). MAKE SURE YOU:   Understand these instructions.  Will watch your condition.  Will get help right away if you are not doing well or get worse.   This information is not intended to replace advice given to you by your health care provider. Make sure you discuss any questions you have with your health care provider.   Document Released: 10/12/2007 Document Revised: 02/13/2013 Document Reviewed: 12/31/2012 Elsevier Interactive Patient Education [employer]

## 2015-10-17 IMAGING — CR DG UGI W/ KUB
1 series · 1 of 1 positions shown · non-contrast
Comparison: Ultrasound 11/08/2013.

CLINICAL DATA: Abdominal pain.  Epigastric pain.

EXAM:
UPPER GI SERIES WITH KUB
TECHNIQUE: After obtaining a scout radiograph a routine upper GI series was
performed using thin and high density barium.
FLUOROSCOPY TIME:  2 min and 49 seconds

[view not recorded]
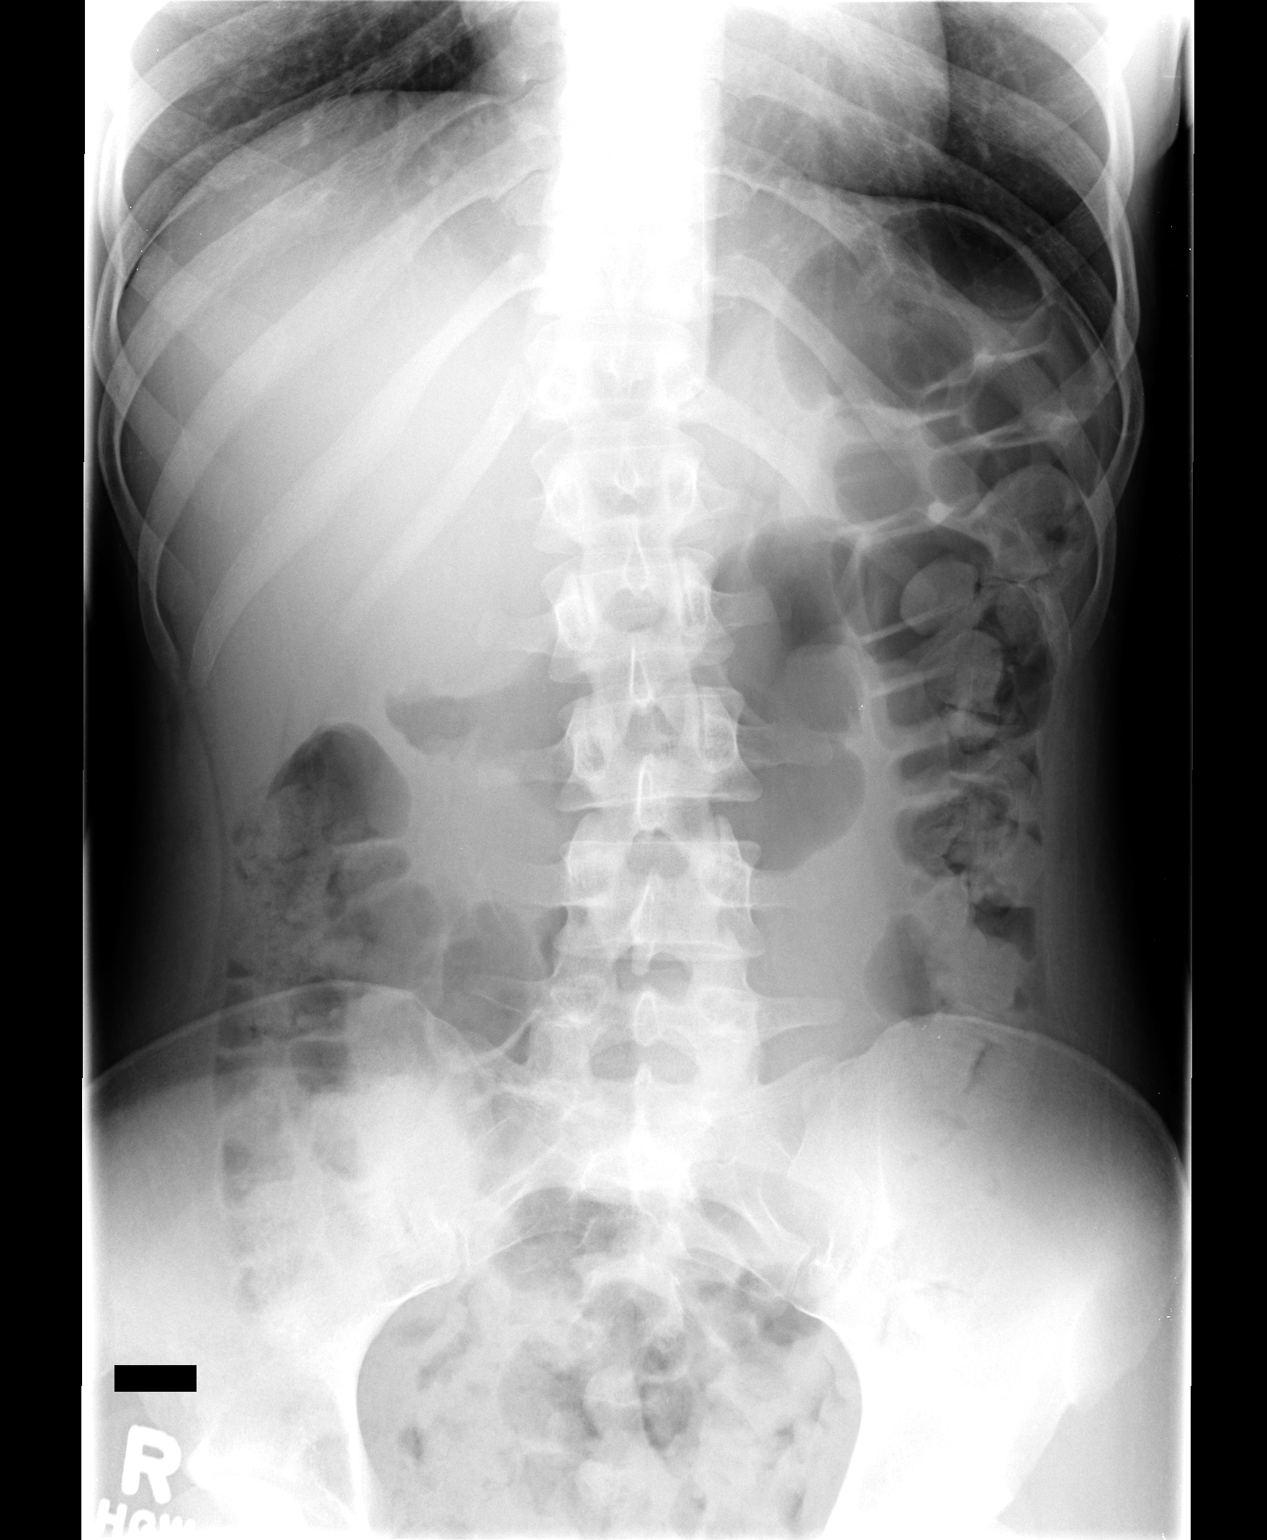

[1 of 1 positions shown; findings below may reference images not displayed]

FINDINGS: KUB demonstrates a mild levoconvex lumbar scoliosis.

Pharyngeal phase of swallowing was normal. Esophageal motility and
esophageal folds were within normal limits. Gastroesophageal
junction was normal. Motility was normal with 3 of 3 waves reaching
the gastroesophageal junction.

13 mm barium tablet challenge was employed. There was no barrier to
passage of the tablet.

Gastroesophageal reflux to the distal third of the esophagus was
observed with water siphon test in the supine position.
IMPRESSION: Normal upper GI study aside from mild gastroesophageal reflux to the
distal third of the esophagus.

## 2016-12-20 ENCOUNTER — Emergency Department (HOSPITAL_COMMUNITY)
Admission: EM | Admit: 2016-12-20 | Discharge: 2016-12-20 | Disposition: A | Discharge status: Home / Self Care | Payer: 59 | Attending: Physician Assistant | Admitting: Physician Assistant

## 2016-12-20 ENCOUNTER — Encounter (HOSPITAL_COMMUNITY): Payer: Self-pay | Admitting: Emergency Medicine

## 2016-12-20 DIAGNOSIS — J45909 Unspecified asthma, uncomplicated: Secondary | ICD-10-CM | POA: Insufficient documentation

## 2016-12-20 DIAGNOSIS — D573 Sickle-cell trait: Secondary | ICD-10-CM | POA: Insufficient documentation

## 2016-12-20 DIAGNOSIS — R1110 Vomiting, unspecified: Secondary | ICD-10-CM | POA: Insufficient documentation

## 2016-12-20 DIAGNOSIS — R112 Nausea with vomiting, unspecified: Secondary | ICD-10-CM

## 2016-12-20 LAB — COMPREHENSIVE METABOLIC PANEL
ALBUMIN: 4.2 g/dL (ref 3.5–5.0)
ALT: 23 U/L (ref 14–54)
AST: 27 U/L (ref 15–41)
Alkaline Phosphatase: 44 U/L (ref 38–126)
Anion gap: 8 (ref 5–15)
BUN: 11 mg/dL (ref 6–20)
CHLORIDE: 107 mmol/L (ref 101–111)
CO2: 24 mmol/L (ref 22–32)
Calcium: 9.3 mg/dL (ref 8.9–10.3)
Creatinine, Ser: 0.82 mg/dL (ref 0.44–1.00)
GFR calc Af Amer: >60 mL/min (ref >60)
GFR calc non Af Amer: >60 mL/min (ref >60)
GLUCOSE: 97 mg/dL (ref 65–99)
Potassium: 3.5 mmol/L (ref 3.5–5.1)
Sodium: 139 mmol/L (ref 135–145)
Total Bilirubin: 0.5 mg/dL (ref 0.3–1.2)
Total Protein: 6.9 g/dL (ref 6.5–8.1)

## 2016-12-20 LAB — URINALYSIS, ROUTINE W REFLEX MICROSCOPIC
Bilirubin Urine: NEGATIVE
Glucose, UA: NEGATIVE mg/dL
Hgb urine dipstick: NEGATIVE
Ketones, ur: NEGATIVE mg/dL
Nitrite: NEGATIVE
PH: 6 (ref 5.0–8.0)
Protein, ur: NEGATIVE mg/dL
SPECIFIC GRAVITY, URINE: 1.004 — AB (ref 1.005–1.030)

## 2016-12-20 LAB — CBC
HCT: 39.6 % (ref 36.0–46.0)
Hemoglobin: 13.7 g/dL (ref 12.0–15.0)
MCH: 28.4 pg (ref 26.0–34.0)
MCHC: 34.6 g/dL (ref 30.0–36.0)
MCV: 82 fL (ref 78.0–100.0)
Platelets: 245 10*3/uL (ref 150–400)
RBC: 4.83 MIL/uL (ref 3.87–5.11)
RDW: 12.5 % (ref 11.5–15.5)
WBC: 5.1 10*3/uL (ref 4.0–10.5)

## 2016-12-20 LAB — POC URINE PREG, ED: PREG TEST UR: NEGATIVE

## 2016-12-20 LAB — LIPASE, BLOOD: LIPASE: 29 U/L (ref 11–51)

## 2016-12-20 MED ORDER — ONDANSETRON 4 MG PO TBDP: 4.0000 mg | ORAL_TABLET | Freq: Three times a day (TID) | ORAL | 0 refills | Status: DC | PRN

## 2016-12-20 MED ORDER — OMEPRAZOLE 20 MG PO CPDR: 20.0000 mg | DELAYED_RELEASE_CAPSULE | Freq: Every day | ORAL | 0 refills | Status: DC

## 2016-12-20 MED ORDER — SODIUM CHLORIDE 0.9 % IV BOLUS (SEPSIS)
1000.0000 mL | Freq: Once | INTRAVENOUS | Status: AC
  Administered 2016-12-20: 1000 mL via INTRAVENOUS

## 2016-12-20 MED ORDER — ONDANSETRON HCL 4 MG/2ML IJ SOLN
4.0000 mg | Freq: Once | INTRAMUSCULAR | Status: AC
  Administered 2016-12-20: 4 mg via INTRAVENOUS
  Filled 2016-12-20: qty 2

## 2016-12-20 NOTE — Discharge Instructions (Signed)
Please follow up with her primary care physician.  Weve given you this medication for nausea. Additionally the omeprazole medicine, to help with any kind of increased acid production in your stomach.  Also given that you have had several times, you may follow-up with gastroenterology for further care.

## 2016-12-20 NOTE — ED Provider Notes (Signed)
[facility]-EMERGENCY DEPT Provider Note   CSN: [id number] Arrival date & time: 12/20/16  0631     History   Chief Complaint Chief Complaint  Patient presents with  . Abdominal Pain  . Emesis    HPI Elizabeth Fisher is a 26 y.o. female.  HPI   Very well-appearing 26 year old female presenting with vomiting. Patient reports that she vomited 2 times this morning. This happened on her way to work. She reports she's had this several times the past. She is not a daily marijuana user. She came here for further evaluation. Patient had recent evaluation for subglottic abscess. She reports she had been taking antibioticsfor this. She denies any fever currently or throat symtpoms She reports that her mom is concerned because there is afamily  history of cancer and vomiting was the symptom that her uncle had before he was diagnosed with cancer.  Patient has no abdominal pain. No fevers. No recent weight loss.  Past Medical History:  Diagnosis Date  . Exercise-induced asthma   . Sickle cell trait (HCC)   . 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 NO PAST SURGERIES      OB History    No data available       Home Medications    Prior to Admission medications   Medication Sig Start Date End Date Taking? Authorizing Provider  clindamycin (CLEOCIN) 300 MG capsule Take 1 capsule (300 mg total) by mouth 4 (four) times daily. 08/24/14   Kathlen ModyAkula, Vijaya, MD  traMADol (ULTRAM) 50 MG tablet Take 1 tablet (50 mg total) by mouth every 6 (six) hours as needed for severe pain. 08/24/14   Kathlen ModyAkula, Vijaya, MD    Family History No family history on file.  Social History Social History  Substance Use Topics  . Smoking status: Never Smoker  . Smokeless tobacco: Never Used  . Alcohol use No     Allergies   Other and Shellfish allergy   Review of  Systems Review of Systems  Constitutional: Negative for activity change.  Respiratory: Negative for shortness of breath.   Cardiovascular: Negative for chest pain.  Gastrointestinal: Positive for vomiting. Negative for abdominal pain.     Physical Exam Updated Vital Signs BP 117/87   Pulse 78   Temp 98.4 F (36.9 C) (Oral)   Resp 18   Ht 5\' 4"  (1.626 m)   Wt 48.1 kg (106 lb)   SpO2 99%   BMI 18.19 kg/m   Physical Exam  Constitutional: She is oriented to person, place, and time. She appears well-developed and well-nourished.  HENT:  Head: Normocephalic and atraumatic.  Eyes: Pupils are equal, round, and reactive to light. EOM are normal. Right eye exhibits no discharge. Left eye exhibits no discharge.  Cardiovascular: Normal rate, regular rhythm and normal heart sounds.   No murmur heard. Pulmonary/Chest: Effort normal and breath sounds normal. She has no wheezes. She has no rales.  Abdominal: Soft. She exhibits no distension. There is no tenderness.  Neurological: She is oriented to person, place, and time.  Skin: Skin is warm and dry. She is not diaphoretic.  Psychiatric: She has a normal mood and affect.  Nursing note and vitals reviewed.    ED Treatments / Results  Labs (all labs ordered are listed, but only abnormal results are displayed) Labs Reviewed  URINALYSIS, ROUTINE W REFLEX  MICROSCOPIC - Abnormal; Notable for the following:       Result Value   Color, Urine STRAW (*)    Specific Gravity, Urine 1.004 (*)    Leukocytes, UA TRACE (*)    Bacteria, UA RARE (*)    Squamous Epithelial / LPF 0-5 (*)    All other components within normal limits  LIPASE, BLOOD  COMPREHENSIVE METABOLIC PANEL  CBC  POC URINE PREG, ED    EKG  EKG Interpretation None       Radiology No results found.  Procedures Procedures (including critical care time)  Medications Ordered in ED Medications  sodium chloride 0.9 % bolus 1,000 mL (not administered)  ondansetron  (ZOFRAN) injection 4 mg (not administered)     Initial Impression / Assessment and Plan / ED Course  I have reviewed the triage vital signs and the nursing notes.  Pertinent labs & imaging results that were available during my care of the patient were reviewed by me and considered in my medical decision making (see chart for details).     Very well-appearing 26 year old female presenting with 2 episodes of vomiting. Patient appears very well on exam. We will give a liter fluids, Zofran, by mouth challenge patient. She has no abdominal pain on exam, doubt intra-abdominal pathology given the lack of pain. We will give her follow-up with [facility] gastroenterology and start her on a PPI. The symptoms sound as if they could be related to GERD and with the recent subglottic abscess and think it's reasonable to treat with a PPI.  Patient taking PO by mouth with normal vital signs and ambulatory at time of discharge. Patient will follow up with gastroenterology.  Final Clinical Impressions(s) / ED Diagnoses   Final diagnoses:  None    New Prescriptions New Prescriptions   No medications on file     Abelino Derrick, MD 12/21/16 1042

## 2016-12-20 NOTE — ED Triage Notes (Signed)
Pt c/o abd pain, nausea and vomiting since this morning, denies fever or chills

## 2017-01-12 ENCOUNTER — Encounter (HOSPITAL_COMMUNITY): Payer: Self-pay | Admitting: Emergency Medicine

## 2017-01-12 ENCOUNTER — Emergency Department (HOSPITAL_COMMUNITY)
Admission: EM | Admit: 2017-01-12 | Discharge: 2017-01-12 | Disposition: A | Discharge status: Home / Self Care | Payer: 59 | Attending: Emergency Medicine | Admitting: Emergency Medicine

## 2017-01-12 DIAGNOSIS — R1030 Lower abdominal pain, unspecified: Secondary | ICD-10-CM | POA: Insufficient documentation

## 2017-01-12 DIAGNOSIS — Z79899 Other long term (current) drug therapy: Secondary | ICD-10-CM | POA: Insufficient documentation

## 2017-01-12 DIAGNOSIS — R112 Nausea with vomiting, unspecified: Secondary | ICD-10-CM | POA: Insufficient documentation

## 2017-01-12 DIAGNOSIS — R197 Diarrhea, unspecified: Secondary | ICD-10-CM | POA: Insufficient documentation

## 2017-01-12 DIAGNOSIS — J45909 Unspecified asthma, uncomplicated: Secondary | ICD-10-CM | POA: Insufficient documentation

## 2017-01-12 LAB — CBC
HCT: 41.2 % (ref 36.0–46.0)
Hemoglobin: 14.5 g/dL (ref 12.0–15.0)
MCH: 28.2 pg (ref 26.0–34.0)
MCHC: 35.2 g/dL (ref 30.0–36.0)
MCV: 80.2 fL (ref 78.0–100.0)
PLATELETS: 193 10*3/uL (ref 150–400)
RBC: 5.14 MIL/uL — ABNORMAL HIGH (ref 3.87–5.11)
RDW: 12.3 % (ref 11.5–15.5)
WBC: 6.5 10*3/uL (ref 4.0–10.5)

## 2017-01-12 LAB — URINALYSIS, ROUTINE W REFLEX MICROSCOPIC
Bilirubin Urine: NEGATIVE
Glucose, UA: NEGATIVE mg/dL
Ketones, ur: 20 mg/dL — AB
LEUKOCYTES UA: NEGATIVE
Nitrite: NEGATIVE
PH: 5 (ref 5.0–8.0)
Protein, ur: 30 mg/dL — AB
Specific Gravity, Urine: 1.025 (ref 1.005–1.030)

## 2017-01-12 LAB — COMPREHENSIVE METABOLIC PANEL
ALT: 21 U/L (ref 14–54)
AST: 23 U/L (ref 15–41)
Albumin: 4.6 g/dL (ref 3.5–5.0)
Alkaline Phosphatase: 50 U/L (ref 38–126)
Anion gap: 15 (ref 5–15)
BUN: 12 mg/dL (ref 6–20)
CHLORIDE: 101 mmol/L (ref 101–111)
CO2: 21 mmol/L — ABNORMAL LOW (ref 22–32)
Calcium: 9.5 mg/dL (ref 8.9–10.3)
Creatinine, Ser: 0.71 mg/dL (ref 0.44–1.00)
GFR calc Af Amer: >60 mL/min (ref >60)
Glucose, Bld: 109 mg/dL — ABNORMAL HIGH (ref 65–99)
Potassium: 3.2 mmol/L — ABNORMAL LOW (ref 3.5–5.1)
Sodium: 137 mmol/L (ref 135–145)
Total Bilirubin: 0.9 mg/dL (ref 0.3–1.2)
Total Protein: 7.9 g/dL (ref 6.5–8.1)

## 2017-01-12 LAB — LIPASE, BLOOD: Lipase: 17 U/L (ref 11–51)

## 2017-01-12 LAB — PREGNANCY, URINE: Preg Test, Ur: NEGATIVE

## 2017-01-12 MED ORDER — ONDANSETRON HCL 4 MG/2ML IJ SOLN
4.0000 mg | Freq: Once | INTRAMUSCULAR | Status: AC
  Administered 2017-01-12: 4 mg via INTRAVENOUS
  Filled 2017-01-12: qty 2

## 2017-01-12 MED ORDER — POTASSIUM CHLORIDE CRYS ER 20 MEQ PO TBCR
30.0000 meq | EXTENDED_RELEASE_TABLET | Freq: Once | ORAL | Status: AC
  Administered 2017-01-12: 30 meq via ORAL
  Filled 2017-01-12: qty 1

## 2017-01-12 MED ORDER — SODIUM CHLORIDE 0.9 % IV BOLUS (SEPSIS)
1000.0000 mL | Freq: Once | INTRAVENOUS | Status: AC
  Administered 2017-01-12: 1000 mL via INTRAVENOUS

## 2017-01-12 MED ORDER — ONDANSETRON HCL 4 MG PO TABS: 4.0000 mg | ORAL_TABLET | Freq: Four times a day (QID) | ORAL | 0 refills | Status: DC

## 2017-01-12 NOTE — ED Triage Notes (Signed)
Pt reports n/v/d for the past 2 days. Tried zofran with no relief. Has also had chills with her symptoms.

## 2017-01-12 NOTE — ED Provider Notes (Signed)
[facility]-EMERGENCY DEPT Provider Note   CSN: [id number] Arrival date & time: 01/12/17  1310     History   Chief Complaint Chief Complaint  Patient presents with  . Emesis  . Diarrhea    HPI Elizabeth Fisher is a 26 y.o. female who presents emergent department today for abdominal pain, emesis,and diarrhea x 2 days. Patient is that she was returning home from a job interview 2 evenings ago when she developed a lower abdominal pain is crampy in nature. She states that she felt she had a rush to the bathroom and had a bout of both non-bloody, non-melanotic this diarrhea and non-bloody, non-bilious emesis. Since then she has had 10 episodes of both emesis and diarrhea with the same characteristics. She states 5 minutes before her bouts of diarrhea or emesis she will get the same crampy abdominal pain. There is no radiation of the abdominal pain. The first day that she had this she took Zofran that she had left over from her previous visit. She said that this helped relieve her nausea prevented her from having emesis however she only had 1 pill for this. She notes some associated chills which she describes as feeling cold. No shivering. She denies fever, cough, chest pain, shortness of breath, recent travel, constipation, vaginal pain, vaginal discharge, vaginal bleeding, hematuria, urgency, urinary frequency, dysuria. She denies sexual intercourse in the last year.No past previous abdominal surgeries. Currently menstruating.   HPI  Past Medical History:  Diagnosis Date  . Exercise-induced asthma   . Sickle cell trait (HCC)   . 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 NO PAST SURGERIES      OB History    No data available       Home Medications    Prior to Admission medications   Medication Sig Start Date End Date Taking? Authorizing Provider    ibuprofen (ADVIL,MOTRIN) 200 MG tablet Take 200 mg by mouth every 6 (six) hours as needed for headache.   Yes [provider]  omeprazole (PRILOSEC) 20 MG capsule Take 1 capsule (20 mg total) by mouth daily. 12/20/16  Yes Mackuen, Courteney Lyn, MD  ondansetron (ZOFRAN ODT) 4 MG disintegrating tablet Take 1 tablet (4 mg total) by mouth every 8 (eight) hours as needed for nausea or vomiting. 12/20/16  Yes Mackuen, Courteney Lyn, MD  clindamycin (CLEOCIN) 300 MG capsule Take 1 capsule (300 mg total) by mouth 4 (four) times daily. Patient not taking: Reported on 12/20/2016 08/24/14   Kathlen Mody, MD  ondansetron (ZOFRAN) 4 MG tablet Take 1 tablet (4 mg total) by mouth every 6 (six) hours. 01/12/17   Tanda Morrissey, Elmer Sow, PA-C  traMADol (ULTRAM) 50 MG tablet Take 1 tablet (50 mg total) by mouth every 6 (six) hours as needed for severe pain. Patient not taking: Reported on 12/20/2016 08/24/14   Kathlen Mody, MD    Family History History reviewed. No pertinent family history.  Social History Social History  Substance Use Topics  . Smoking status: Never Smoker  . Smokeless tobacco: Never Used  . Alcohol use No     Allergies   Other and Shellfish allergy   Review of Systems Review of Systems  All other systems reviewed and are negative.    Physical Exam Updated Vital Signs BP (!) 116/93   Pulse 81   Temp 98.2 F (  36.8 C) (Oral)   Resp 18   SpO2 100%   Physical Exam  Constitutional: She appears well-developed and well-nourished.  Non-toxic appearing  HENT:  Head: Normocephalic and atraumatic.  Right Ear: External ear normal.  Left Ear: External ear normal.  Nose: Nose normal.  Mouth/Throat: Uvula is midline, oropharynx is clear and moist and mucous membranes are normal. No tonsillar exudate.  Eyes: Pupils are equal, round, and reactive to light. Right eye exhibits no discharge. Left eye exhibits no discharge. No scleral icterus.  Neck: Trachea normal. Neck supple. No  spinous process tenderness present. No neck rigidity. Normal range of motion present.  Cardiovascular: Normal rate, regular rhythm and intact distal pulses.   No murmur heard. Pulses:      Radial pulses are 2+ on the right side, and 2+ on the left side.       Dorsalis pedis pulses are 2+ on the right side, and 2+ on the left side.       Posterior tibial pulses are 2+ on the right side, and 2+ on the left side.  No lower extremity swelling or edema. Calves symmetric in size bilaterally.  Pulmonary/Chest: Effort normal and breath sounds normal. She exhibits no tenderness.  Abdominal: Soft. Normal appearance and bowel sounds are normal. She exhibits no mass. There is no tenderness. There is no rigidity, no rebound, no guarding, no CVA tenderness and negative Murphy's sign.  Musculoskeletal: She exhibits no edema.  Lymphadenopathy:    She has no cervical adenopathy.  Neurological: She is alert.  Skin: Skin is warm and dry. No rash noted. She is not diaphoretic.  Psychiatric: She has a normal mood and affect.  Nursing note and vitals reviewed.    ED Treatments / Results  Labs (all labs ordered are listed, but only abnormal results are displayed) Labs Reviewed  COMPREHENSIVE METABOLIC PANEL - Abnormal; Notable for the following:       Result Value   Potassium 3.2 (*)    CO2 21 (*)    Glucose, Bld 109 (*)    All other components within normal limits  CBC - Abnormal; Notable for the following:    RBC 5.14 (*)    All other components within normal limits  URINALYSIS, ROUTINE W REFLEX MICROSCOPIC - Abnormal; Notable for the following:    APPearance HAZY (*)    Hgb urine dipstick MODERATE (*)    Ketones, ur 20 (*)    Protein, ur 30 (*)    Bacteria, UA RARE (*)    Squamous Epithelial / LPF 6-30 (*)    All other components within normal limits  LIPASE, BLOOD  PREGNANCY, URINE  I-STAT BETA HCG BLOOD, ED ([facility], [facility], [facility] ONLY)    EKG  EKG Interpretation None       Radiology No  results found.  Procedures Procedures (including critical care time)  Medications Ordered in ED Medications  sodium chloride 0.9 % bolus 1,000 mL (1,000 mLs Intravenous New Bag/Given 01/12/17 2135)  ondansetron (ZOFRAN) injection 4 mg (4 mg Intravenous Given 01/12/17 2135)     Initial Impression / Assessment and Plan / ED Course  I have reviewed the triage vital signs and the nursing notes.  Pertinent labs & imaging results that were available during my care of the patient were reviewed by me and considered in my medical decision making (see chart for details).     Patient is nontoxic, nonseptic appearing, in no apparent distress.  Patient's is without abdominal pain in the department.  IVF bolus given. Zofran given in the department that adequately managed the patients symptoms in the department.  Fluid bolus given.  Labs, imaging and vitals reviewed.  Patient is without white count. UA normal. Hgb in urine likely due to patient menstruating. Pregnancy negative. Potassium 3.2. Replaced orally. Patient does not meet the SIRS or Sepsis criteria.  On repeat exam patient does not have a surgical abdomin and there are no peritoneal signs.  No indication of appendicitis, bowel obstruction, bowel perforation, cholecystitis, diverticulitis, PID or ectopic pregnancy.  Patient discharged home with symptomatic treatment and given strict instructions for follow-up with their primary care physician.  I have also discussed reasons to return immediately to the ER.  Patient expresses understanding and agrees with plan.   Final Clinical Impressions(s) / ED Diagnoses   Final diagnoses:  Nausea vomiting and diarrhea    New Prescriptions New Prescriptions   ONDANSETRON (ZOFRAN) 4 MG TABLET    Take 1 tablet (4 mg total) by mouth every 6 (six) hours.     Jacinto Halim, PA-C 01/12/17 2230    Jacinto Halim, PA-C 01/12/17 2245    Jacinto Halim, PA-C 01/12/17 2246    Vanetta Mulders,  MD 01/12/17 [phone]

## 2017-01-12 NOTE — Discharge Instructions (Signed)
Please read and follow all provided instructions.  Your diagnoses today include:  1. Nausea vomiting and diarrhea    Tests performed today include: Vital signs. See below for your results today.  Blood work: This was reassuring. Urinalysis: No signs of infection.  Pregnancy test: Negative.   Medications prescribed:  Zofran  Home care instructions:  Follow any educational materials contained in this packet. I recommend that you follow a BRAT (Banana's, RICE, Applesauce, Toast) diet. This is a bland diet. Progress this slowly. It is important to fluid hydrate. Things like Pedialyte are good for rehydration. You should do this when you do not feel nausish. If you feel you are going to vomit, use zofran as directed. Progress fluids slowly with small sips and wait for 10-15 min between sips till you tolerate larger amounts and can drink without vomiting.   Follow-up instructions: Please follow-up with your primary care provider for further evaluation of symptoms and treatment   Return instructions:  Please seek immediate care if you have any develop any of the following symptoms: The pain does not go away.  You have a fever.  You keep throwing up (vomiting).  The pain is felt only in portions of the abdomen. Pain in the right side could possibly be appendicitis. In an adult, pain in the left lower portion of the abdomen could be colitis or diverticulitis.  You pass bloody or black tarry stools.  There is bright red blood in the stool.  The constipation stays for more than 4 days.  There is belly (abdominal) or rectal pain.  You do not seem to be getting better.  You have any questions or concerns.  Please return if you have any other emergent concerns.  Additional Information:  Your vital signs today were: BP (!) 116/93    Pulse 81    Temp 98.2 F (36.8 C) (Oral)    Resp 18    SpO2 100%  If your blood pressure (BP) was elevated above 135/85 this visit, please have this repeated by  your doctor within one month.

## 2017-01-16 LAB — I-STAT BETA HCG BLOOD, ED (MC, WL, AP ONLY): I-stat hCG, quantitative: <5 m[IU]/mL (ref <5)

## 2017-04-17 ENCOUNTER — Emergency Department (HOSPITAL_COMMUNITY): Payer: Self-pay

## 2017-04-17 ENCOUNTER — Encounter (HOSPITAL_COMMUNITY): Payer: Self-pay | Admitting: *Deleted

## 2017-04-17 ENCOUNTER — Emergency Department (HOSPITAL_COMMUNITY)
Admission: EM | Admit: 2017-04-17 | Discharge: 2017-04-17 | Disposition: A | Discharge status: Home / Self Care | Payer: Self-pay | Attending: Emergency Medicine | Admitting: Emergency Medicine

## 2017-04-17 DIAGNOSIS — J038 Acute tonsillitis due to other specified organisms: Secondary | ICD-10-CM | POA: Insufficient documentation

## 2017-04-17 DIAGNOSIS — D573 Sickle-cell trait: Secondary | ICD-10-CM | POA: Insufficient documentation

## 2017-04-17 DIAGNOSIS — Z79899 Other long term (current) drug therapy: Secondary | ICD-10-CM | POA: Insufficient documentation

## 2017-04-17 DIAGNOSIS — B9689 Other specified bacterial agents as the cause of diseases classified elsewhere: Secondary | ICD-10-CM | POA: Insufficient documentation

## 2017-04-17 DIAGNOSIS — Z791 Long term (current) use of non-steroidal anti-inflammatories (NSAID): Secondary | ICD-10-CM | POA: Insufficient documentation

## 2017-04-17 LAB — CBC WITH DIFFERENTIAL/PLATELET
BAND NEUTROPHILS: 0 %
BASOS PCT: 0 %
BLASTS: 0 %
Basophils Absolute: 0 10*3/uL (ref 0.0–0.1)
EOS ABS: 0 10*3/uL (ref 0.0–0.7)
Eosinophils Relative: 0 %
HEMATOCRIT: 41.8 % (ref 36.0–46.0)
Hemoglobin: 14.4 g/dL (ref 12.0–15.0)
LYMPHS PCT: 4 %
Lymphs Abs: 0.8 10*3/uL (ref 0.7–4.0)
MCH: 28.6 pg (ref 26.0–34.0)
MCHC: 34.4 g/dL (ref 30.0–36.0)
MCV: 83.1 fL (ref 78.0–100.0)
MONO ABS: 0.6 10*3/uL (ref 0.1–1.0)
MONOS PCT: 3 %
Metamyelocytes Relative: 0 %
Myelocytes: 0 %
NEUTROS ABS: 19.6 10*3/uL — AB (ref 1.7–7.7)
NRBC: 0 /100{WBCs}
Neutrophils Relative %: 93 %
OTHER: 0 %
Platelets: 203 10*3/uL (ref 150–400)
Promyelocytes Absolute: 0 %
RBC: 5.03 MIL/uL (ref 3.87–5.11)
RDW: 13.1 % (ref 11.5–15.5)
WBC: 21 10*3/uL — ABNORMAL HIGH (ref 4.0–10.5)

## 2017-04-17 LAB — BASIC METABOLIC PANEL
ANION GAP: 11 (ref 5–15)
BUN: 7 mg/dL (ref 6–20)
CO2: 22 mmol/L (ref 22–32)
Calcium: 9.2 mg/dL (ref 8.9–10.3)
Chloride: 103 mmol/L (ref 101–111)
Creatinine, Ser: 0.79 mg/dL (ref 0.44–1.00)
GFR calc Af Amer: >60 mL/min (ref >60)
GFR calc non Af Amer: >60 mL/min (ref >60)
GLUCOSE: 110 mg/dL — AB (ref 65–99)
POTASSIUM: 3.7 mmol/L (ref 3.5–5.1)
Sodium: 136 mmol/L (ref 135–145)

## 2017-04-17 LAB — RAPID STREP SCREEN (MED CTR MEBANE ONLY): Streptococcus, Group A Screen (Direct): NEGATIVE

## 2017-04-17 LAB — PREGNANCY, URINE: Preg Test, Ur: NEGATIVE

## 2017-04-17 MED ORDER — DEXAMETHASONE SODIUM PHOSPHATE 10 MG/ML IJ SOLN
10.0000 mg | Freq: Once | INTRAMUSCULAR | Status: AC
  Administered 2017-04-17: 10 mg via INTRAVENOUS
  Filled 2017-04-17: qty 1

## 2017-04-17 MED ORDER — CLINDAMYCIN HCL 300 MG PO CAPS: 300.0000 mg | ORAL_CAPSULE | Freq: Four times a day (QID) | ORAL | 0 refills | Status: DC

## 2017-04-17 MED ORDER — SODIUM CHLORIDE 0.9 % IV BOLUS (SEPSIS)
1000.0000 mL | Freq: Once | INTRAVENOUS | Status: AC
  Administered 2017-04-17: 1000 mL via INTRAVENOUS

## 2017-04-17 MED ORDER — TRAMADOL HCL 50 MG PO TABS: 50.0000 mg | ORAL_TABLET | Freq: Four times a day (QID) | ORAL | 0 refills | Status: DC | PRN

## 2017-04-17 MED ORDER — CLINDAMYCIN PHOSPHATE 600 MG/50ML IV SOLN
600.0000 mg | Freq: Once | INTRAVENOUS | Status: AC
  Administered 2017-04-17: 600 mg via INTRAVENOUS
  Filled 2017-04-17: qty 50

## 2017-04-17 MED ORDER — IOPAMIDOL (ISOVUE-300) INJECTION 61%
INTRAVENOUS | Status: AC
  Administered 2017-04-17: 75 mL via INTRAVENOUS
  Filled 2017-04-17: qty 75

## 2017-04-17 MED ORDER — KETOROLAC TROMETHAMINE 30 MG/ML IJ SOLN
30.0000 mg | Freq: Once | INTRAMUSCULAR | Status: AC
  Administered 2017-04-17: 30 mg via INTRAVENOUS
  Filled 2017-04-17: qty 1

## 2017-04-17 NOTE — ED Triage Notes (Signed)
Pt is here by ems. Pt has hx of an abscess on her vocal cords.  She states that she was previously treated for this with antibiotics and feels like it is back.  Pt reports painful swallowing x2 days, nausea and vomiting.  No sob.

## 2017-04-17 NOTE — ED Provider Notes (Signed)
[facility]   CSN: [id number] Arrival date & time: 04/17/17  1528     History   Chief Complaint Chief Complaint  Patient presents with  . abscess on vocal cords    HPI Elizabeth Fisher is a 26 y.o. female.  Pt presents to the ED today with a sore throat.  Pt does have a hx of a subglottic abscess back in April of 2016.  She said her throat feels similar to that time.  She said it hurts to swallow and it hurts to talk.  No fever at home or here.      Past Medical History:  Diagnosis Date  . Exercise-induced asthma   . Sickle cell trait (HCC)   . 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 NO PAST SURGERIES      OB History    No data available       Home Medications    Prior to Admission medications   Medication Sig Start Date End Date Taking? Authorizing Provider  acetaminophen (TYLENOL) 325 MG tablet Take 325-650 mg by mouth every 6 (six) hours as needed (for pain or headaches).   Yes [provider]  ibuprofen (ADVIL,MOTRIN) 200 MG tablet Take 200-400 mg by mouth every 6 (six) hours as needed (for pain or headaches).    Yes [provider]  ondansetron (ZOFRAN) 4 MG tablet Take 1 tablet (4 mg total) by mouth every 6 (six) hours. Patient taking differently: Take 4 mg by mouth every 6 (six) hours as needed for nausea or vomiting.  01/12/17  Yes Maczis, Elizabeth SowMichael M, PA-C  clindamycin (CLEOCIN) 300 MG capsule Take 1 capsule (300 mg total) by mouth 4 (four) times daily. 04/17/17   Jacalyn LefevreHaviland, Arslan Kier, MD  omeprazole (PRILOSEC) 20 MG capsule Take 1 capsule (20 mg total) by mouth daily. Patient not taking: Reported on 04/17/2017 12/20/16   Mackuen, Courteney Lyn, MD  ondansetron (ZOFRAN ODT) 4 MG disintegrating tablet Take 1 tablet (4 mg total) by mouth every 8  (eight) hours as needed for nausea or vomiting. Patient not taking: Reported on 04/17/2017 12/20/16   Mackuen, Courteney Lyn, MD  traMADol (ULTRAM) 50 MG tablet Take 1 tablet (50 mg total) by mouth every 6 (six) hours as needed for severe pain. 04/17/17   Jacalyn LefevreHaviland, Destan Franchini, MD    Family History No family history on file.  Social History Social History   Tobacco Use  . Smoking status: Never Smoker  . Smokeless tobacco: Never Used  Substance Use Topics  . Alcohol use: No  . Drug use: No     Allergies   Blueberry flavor; Peach [prunus persica]; Pineapple; and Shellfish allergy   Review of Systems Review of Systems  HENT: Positive for sore throat.   All other systems reviewed and are negative.    Physical Exam Updated Vital Signs BP (!) 127/92   Pulse 99   Temp 98.8 F (37.1 C) (Oral)   Resp 15   Wt 45.4 kg (100 lb)   SpO2 100%   BMI 17.16 kg/m   Physical Exam  Constitutional: She is oriented to person, place, and time. She appears well-developed and well-nourished.  HENT:  Head: Normocephalic and atraumatic.  Right Ear: External ear normal.  Left Ear: External ear normal.  Nose: Nose normal.  Mouth/Throat: Oropharyngeal exudate  and posterior oropharyngeal erythema present.  Eyes: Conjunctivae and EOM are normal. Pupils are equal, round, and reactive to light.  Neck: Normal range of motion. Neck supple.  Cardiovascular: Normal rate, regular rhythm, normal heart sounds and intact distal pulses.  Pulmonary/Chest: Effort normal and breath sounds normal.  Abdominal: Soft. Bowel sounds are normal.  Musculoskeletal: Normal range of motion.  Lymphadenopathy:    She has cervical adenopathy.  Neurological: She is alert and oriented to person, place, and time.  Skin: Skin is warm. Capillary refill takes less than 2 seconds.  Psychiatric: She has a normal mood and affect. Her behavior is normal. Judgment and thought content normal.  Nursing note and vitals  reviewed.    ED Treatments / Results  Labs (all labs ordered are listed, but only abnormal results are displayed) Labs Reviewed  BASIC METABOLIC PANEL - Abnormal; Notable for the following components:      Result Value   Glucose, Bld 110 (*)    All other components within normal limits  CBC WITH DIFFERENTIAL/PLATELET - Abnormal; Notable for the following components:   WBC 21.0 (*)    Neutro Abs 19.6 (*)    All other components within normal limits  RAPID STREP SCREEN (NOT AT [facility])  CULTURE, GROUP A STREP [facility])  PREGNANCY, URINE    EKG  EKG Interpretation None       Radiology Ct Soft Tissue Neck W Contrast  Result Date: 04/17/2017 CLINICAL DATA:  Difficulty swallowing.  Nausea and vomiting. EXAM: CT NECK WITH CONTRAST TECHNIQUE: Multidetector CT imaging of the neck was performed using the standard protocol following the bolus administration of intravenous contrast. CONTRAST:  75mL ISOVUE-300 IOPAMIDOL (ISOVUE-300) INJECTION 61% COMPARISON:  CT neck 08/21/2014 FINDINGS: Pharynx and larynx: --Nasopharynx: Fossae of Rosenmuller are clear. Normal adenoid tonsils for age. --Oral cavity and oropharynx: Bilateral edema of the palatine tonsils areas heterogeneous enhancement. No peritonsillar fluid collection. Lingual tonsils are normal. --Hypopharynx: Normal vallecula and pyriform sinuses. --Larynx: Normal epiglottis and pre-epiglottic space. Normal aryepiglottic and vocal folds. --Retropharyngeal space: No abscess, effusion or lymphadenopathy. Salivary glands: --Parotid: No mass lesion or inflammation. No sialolithiasis or ductal dilatation. --Submandibular: Symmetric without inflammation. No sialolithiasis or ductal dilatation. --Sublingual: Normal. No ranula or other visible lesion of the base of tongue and floor of mouth. Thyroid: Normal. Lymph nodes: 9 mm right level IIa node. No abnormal density nodes. Left level IIa nodes measure up to 8 mm. Vascular: Major cervical vessels are  patent. Limited intracranial: Normal. Visualized orbits: Normal. Mastoids and visualized paranasal sinuses: No fluid levels or advanced mucosal thickening. No mastoid effusion. Skeleton: No bony spinal canal stenosis. No lytic or blastic lesions. Upper chest: Clear. Other: None. IMPRESSION: 1. Edematous and heterogeneous palatine tonsils, consistent with acute tonsillopharyngitis. The heterogeneous enhancement may indicate early phlegmonous change, but there is currently no peritonsillar abscess. 2. No retropharyngeal or laryngeal abscess or other focal abnormality. Electronically Signed   By: Deatra Robinson M.D.   On: 04/17/2017 18:25    Procedures Procedures (including critical care time)  Medications Ordered in ED Medications  sodium chloride 0.9 % bolus 1,000 mL (1,000 mLs Intravenous New Bag/Given 04/17/17 1644)  clindamycin (CLEOCIN) IVPB 600 mg (0 mg Intravenous Stopped 04/17/17 1714)  dexamethasone (DECADRON) injection 10 mg (10 mg Intravenous Given 04/17/17 1644)  ketorolac (TORADOL) 30 MG/ML injection 30 mg (30 mg Intravenous Given 04/17/17 1644)  iopamidol (ISOVUE-300) 61 % injection (75 mLs Intravenous Contrast Given 04/17/17 1748)     Initial Impression / Assessment  and Plan / ED Course  I have reviewed the triage vital signs and the nursing notes.  Pertinent labs & imaging results that were available during my care of the patient were reviewed by me and considered in my medical decision making (see chart for details).     Pt is feeling better.  She is d/c with clinda and ultram.  She is instructed to f/u with ENT.  Return if worse.  Final Clinical Impressions(s) / ED Diagnoses   Final diagnoses:  Acute bacterial tonsillitis    ED Discharge Orders        Ordered    clindamycin (CLEOCIN) 300 MG capsule  4 times daily     04/17/17 1830    traMADol (ULTRAM) 50 MG tablet  Every 6 hours PRN     04/17/17 1830       Jacalyn LefevreHaviland, Nicholson Starace, MD 04/17/17 1831

## 2017-04-17 NOTE — ED Notes (Signed)
Hx of subglottic abscess.  Redness and some white patches noted in the back of her throat.  Pt reports 10/10 pain with swallowing

## 2017-04-20 LAB — CULTURE, GROUP A STREP (THRC)

## 2018-01-08 ENCOUNTER — Emergency Department (HOSPITAL_COMMUNITY): Payer: Self-pay

## 2018-01-08 ENCOUNTER — Encounter (HOSPITAL_COMMUNITY): Payer: Self-pay

## 2018-01-08 ENCOUNTER — Emergency Department (HOSPITAL_COMMUNITY)
Admission: EM | Admit: 2018-01-08 | Discharge: 2018-01-08 | Disposition: A | Discharge status: Home / Self Care | Payer: Self-pay | Attending: Emergency Medicine | Admitting: Emergency Medicine

## 2018-01-08 ENCOUNTER — Other Ambulatory Visit: Payer: Self-pay

## 2018-01-08 DIAGNOSIS — R102 Pelvic and perineal pain: Secondary | ICD-10-CM

## 2018-01-08 DIAGNOSIS — N920 Excessive and frequent menstruation with regular cycle: Secondary | ICD-10-CM

## 2018-01-08 DIAGNOSIS — O23599 Infection of other part of genital tract in pregnancy, unspecified trimester: Secondary | ICD-10-CM | POA: Insufficient documentation

## 2018-01-08 DIAGNOSIS — Z3A08 8 weeks gestation of pregnancy: Secondary | ICD-10-CM | POA: Insufficient documentation

## 2018-01-08 DIAGNOSIS — N760 Acute vaginitis: Secondary | ICD-10-CM

## 2018-01-08 DIAGNOSIS — B9689 Other specified bacterial agents as the cause of diseases classified elsewhere: Secondary | ICD-10-CM | POA: Insufficient documentation

## 2018-01-08 DIAGNOSIS — O200 Threatened abortion: Secondary | ICD-10-CM

## 2018-01-08 DIAGNOSIS — Z79899 Other long term (current) drug therapy: Secondary | ICD-10-CM | POA: Insufficient documentation

## 2018-01-08 LAB — CBC WITH DIFFERENTIAL/PLATELET
Abs Immature Granulocytes: 0 10*3/uL (ref 0.0–0.1)
BASOS PCT: 1 %
Basophils Absolute: 0 10*3/uL (ref 0.0–0.1)
EOS ABS: 0.1 10*3/uL (ref 0.0–0.7)
Eosinophils Relative: 3 %
HEMATOCRIT: 42.5 % (ref 36.0–46.0)
Hemoglobin: 14.1 g/dL (ref 12.0–15.0)
Immature Granulocytes: 0 %
Lymphocytes Relative: 31 %
Lymphs Abs: 1.3 10*3/uL (ref 0.7–4.0)
MCH: 28.2 pg (ref 26.0–34.0)
MCHC: 33.2 g/dL (ref 30.0–36.0)
MCV: 85 fL (ref 78.0–100.0)
MONO ABS: 0.4 10*3/uL (ref 0.1–1.0)
MONOS PCT: 10 %
Neutro Abs: 2.5 10*3/uL (ref 1.7–7.7)
Neutrophils Relative %: 57 %
PLATELETS: 241 10*3/uL (ref 150–400)
RBC: 5 MIL/uL (ref 3.87–5.11)
RDW: 12.2 % (ref 11.5–15.5)
WBC: 4.3 10*3/uL (ref 4.0–10.5)

## 2018-01-08 LAB — WET PREP, GENITAL
Sperm: NONE SEEN
TRICH WET PREP: NONE SEEN
Yeast Wet Prep HPF POC: NONE SEEN

## 2018-01-08 LAB — ABO/RH: ABO/RH(D): AB POS

## 2018-01-08 LAB — HCG, QUANTITATIVE, PREGNANCY: hCG, Beta Chain, Quant, S: 9888 m[IU]/mL — ABNORMAL HIGH (ref <5)

## 2018-01-08 NOTE — ED Provider Notes (Signed)
[facility] EMERGENCY DEPARTMENT Provider Note   CSN: [id number] Arrival date & time: 01/08/18  4098     History   Chief Complaint No chief complaint on file.   HPI Elizabeth Fisher is a 27 y.o. female.  27 year old female resents with complaint of vaginal bleeding and abdominal cramping.  Patient states that she was seen at the health department last Thursday and had pregnancy confirmed by urine pregnancy test, scheduled for an ultrasound later in the month.  Patient reports last menstrual cycle October 16, 2017 G1P0. Spotting started on Friday, no recent pelvic exam or intercourse, described as light. Lower abdominal cramping started yesterday which prompted today's visit. Reports breast tenderness, urinary frequency. Denies vomiting, dysuria, changes in bowel habits. No other complaints or concerns.      Past Medical History:  Diagnosis Date  . Exercise-induced asthma   . Sickle cell trait (HCC)   . 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 NO PAST SURGERIES       OB History    Gravida  1   Para      Term      Preterm      AB      Living        SAB      TAB      Ectopic      Multiple      Live Births               Home Medications    Prior to Admission medications   Medication Sig Start Date End Date Taking? Authorizing Provider  acetaminophen (TYLENOL) 325 MG tablet Take 325-650 mg by mouth every 6 (six) hours as needed (for pain or headaches).    [provider]  clindamycin (CLEOCIN) 300 MG capsule Take 1 capsule (300 mg total) by mouth 4 (four) times daily. Patient not taking: Reported on 01/08/2018 04/17/17   Jacalyn Lefevre, MD  ibuprofen (ADVIL,MOTRIN) 200 MG tablet Take 200-400 mg by mouth every 6 (six) hours as needed (for pain or headaches).     [provider]    omeprazole (PRILOSEC) 20 MG capsule Take 1 capsule (20 mg total) by mouth daily. Patient not taking: Reported on 04/17/2017 12/20/16   Mackuen, Courteney Lyn, MD  ondansetron (ZOFRAN ODT) 4 MG disintegrating tablet Take 1 tablet (4 mg total) by mouth every 8 (eight) hours as needed for nausea or vomiting. Patient not taking: Reported on 04/17/2017 12/20/16   Mackuen, Courteney Lyn, MD  ondansetron (ZOFRAN) 4 MG tablet Take 1 tablet (4 mg total) by mouth every 6 (six) hours. Patient not taking: Reported on 01/08/2018 01/12/17   Maczis, Elmer Sow, PA-C  traMADol (ULTRAM) 50 MG tablet Take 1 tablet (50 mg total) by mouth every 6 (six) hours as needed for severe pain. Patient not taking: Reported on 01/08/2018 04/17/17   Jacalyn Lefevre, MD    Family History No family history on file.  Social History Social History   Tobacco Use  . Smoking status: Never Smoker  . Smokeless tobacco: Never Used  Substance Use Topics  . Alcohol use: No  . Drug use: No     Allergies   Blueberry flavor; Peach [prunus persica]; Pineapple; and Shellfish allergy   Review of Systems Review of Systems  Constitutional: Negative for chills and fever.  Gastrointestinal: Positive for abdominal pain. Negative for abdominal distention, constipation, diarrhea, nausea and vomiting.  Genitourinary: Positive for frequency. Negative for dysuria, vaginal bleeding and vaginal discharge.  Musculoskeletal: Negative for arthralgias, back pain and myalgias.  Skin: Negative for rash and wound.  Allergic/Immunologic: Negative for immunocompromised state.  Neurological: Negative for dizziness and weakness.  Hematological: Does not bruise/bleed easily.  Psychiatric/Behavioral: Negative for confusion.  All other systems reviewed and are negative.    Physical Exam Updated Vital Signs BP 118/85   Pulse 76   Temp 98.2 F (36.8 C) (Oral)   Resp 16   LMP 10/16/2017 (Exact Date)   SpO2 100%   Physical Exam  Constitutional:  She is oriented to person, place, and time. She appears well-developed and well-nourished. No distress.  HENT:  Head: Normocephalic and atraumatic.  Cardiovascular: Normal rate, regular rhythm, normal heart sounds and intact distal pulses.  No murmur heard. Pulmonary/Chest: Effort normal and breath sounds normal. No respiratory distress.  Abdominal: Soft. Bowel sounds are normal. She exhibits no distension and no mass. There is no tenderness.  Genitourinary: Uterus is enlarged. Uterus is not tender. Cervix exhibits discharge. Cervix exhibits no motion tenderness and no friability. Right adnexum displays no mass, no tenderness and no fullness. Left adnexum displays no mass, no tenderness and no fullness.  Genitourinary Comments: RN assisted with exam, mild amount of brown blood from cervix, cervix closed. No tenderness.   Neurological: She is alert and oriented to person, place, and time.  Skin: Skin is warm and dry. She is not diaphoretic.  Psychiatric: She has a normal mood and affect. Her behavior is normal.  Nursing note and vitals reviewed.    ED Treatments / Results  Labs (all labs ordered are listed, but only abnormal results are displayed) Labs Reviewed  WET PREP, GENITAL - Abnormal; Notable for the following components:      Result Value   Clue Cells Wet Prep HPF POC PRESENT (*)    WBC, Wet Prep HPF POC FEW (*)    All other components within normal limits  HCG, QUANTITATIVE, PREGNANCY - Abnormal; Notable for the following components:   hCG, Beta Chain, Quant, S 1,610 (*)    All other components within normal limits  CBC WITH DIFFERENTIAL/PLATELET  ABO/RH  GC/CHLAMYDIA PROBE AMP ([place]) NOT AT [facility]    EKG None  Radiology US Ob Comp < 14 Wks  Result Date: 01/08/2018 CLINICAL DATA:  Pelvic cramping and vaginal spotting for the past 3 days. Estimated gestational age of [redacted] weeks, 0 days by LMP. EXAM: OBSTETRIC <14 WK [place] AND TRANSVAGINAL OB US TECHNIQUE: Both  transabdominal and transvaginal ultrasound examinations were performed for complete evaluation of the gestation as well as the maternal uterus, adnexal regions, and pelvic cul-de-sac. Transvaginal technique was performed to assess early pregnancy. COMPARISON:  None. FINDINGS: Intrauterine gestational sac: Single Yolk sac:  Not Visualized. Embryo:  Visualized. Cardiac Activity: Not Visualized. CRL:  18.5 mm   8 w   3 d                  [place] EDC: 08/17/2018 Subchorionic hemorrhage:  Small subchorionic hemorrhage. Maternal uterus/adnexae: 2.5 cm intramural fibroid in the anterior uterine fundus. Normal sonographic appearance of the ovaries. No free fluid. IMPRESSION: Crown-rump length of 18.5 mm without cardiac activity. Findings meet definitive criteria for failed pregnancy. This follows SRU consensus guidelines: Diagnostic Criteria for Nonviable Pregnancy Early in the First Trimester. [facility] Mis J Med [phone]. Electronically Signed  By: Obie Dredge M.D.   On: 01/08/2018 11:44   US Ob Transvaginal  Result Date: 01/08/2018 CLINICAL DATA:  Pelvic cramping and vaginal spotting for the past 3 days. Estimated gestational age of [redacted] weeks, 0 days by LMP. EXAM: OBSTETRIC <14 WK [place] AND TRANSVAGINAL OB US TECHNIQUE: Both transabdominal and transvaginal ultrasound examinations were performed for complete evaluation of the gestation as well as the maternal uterus, adnexal regions, and pelvic cul-de-sac. Transvaginal technique was performed to assess early pregnancy. COMPARISON:  None. FINDINGS: Intrauterine gestational sac: Single Yolk sac:  Not Visualized. Embryo:  Visualized. Cardiac Activity: Not Visualized. CRL:  18.5 mm   8 w   3 d                  [place] EDC: 08/17/2018 Subchorionic hemorrhage:  Small subchorionic hemorrhage. Maternal uterus/adnexae: 2.5 cm intramural fibroid in the anterior uterine fundus. Normal sonographic appearance of the ovaries. No free fluid. IMPRESSION: Crown-rump length of 18.5 mm without  cardiac activity. Findings meet definitive criteria for failed pregnancy. This follows SRU consensus guidelines: Diagnostic Criteria for Nonviable Pregnancy Early in the First Trimester. [facility] Mis J Med [phone]. Electronically Signed   By: Obie Dredge M.D.   On: 01/08/2018 11:44    Procedures Procedures (including critical care time)  Medications Ordered in ED Medications - No data to display   Initial Impression / Assessment and Plan / ED Course  I have reviewed the triage vital signs and the nursing notes.  Pertinent labs & imaging results that were available during my care of the patient were reviewed by me and considered in my medical decision making (see chart for details).  Clinical Course as of Jan 09 1340  Mon Jan 08, 2018  1337 27yo female, G1P0, presents with complaint of vaginal bleeding- spotting, lower abdominal cramping. Patient states LMP 10/16/17, went to health dept Thursday and advised +upreg, given apt with OB for later in September. Patient developed spotting the following day (Friday), light, dark brown; onset abdominal cramps yesterday. On exam, no tenderness, small amount of brown blood present. US shows [redacted]w[redacted]d gestation without cardiac activity. Hgb hct WNL, AB pos does not need rhogam. Discussed results with patient, advised likely demise, follow up with Ob, return to ER for worsening or concerning symptoms. Clue cells on wet prep, patient is not symptomatic, can follow up with OB for this. Patient verbalizes understanding of dc instructions and plan.    [LM]    Clinical Course User Index [LM] Jeannie Fend, PA-C    Final Clinical Impressions(s) / ED Diagnoses   Final diagnoses:  Threatened miscarriage  Bacterial vaginosis    ED Discharge Orders    None       Alden Hipp 01/08/18 1341    Raeford Razor, MD 01/09/18 [phone]

## 2018-01-08 NOTE — ED Notes (Signed)
Pelvic cart at bedside. 

## 2018-01-08 NOTE — ED Triage Notes (Signed)
Patient reports that she had confirmed pregnancy through health department last week. Developed light vaginal bleeding Saturday and heavier today with abdominal cramping. Denies dysuria. G1 P0

## 2018-01-08 NOTE — ED Notes (Signed)
Patient transported to Ultrasound 

## 2018-01-08 NOTE — ED Notes (Signed)
Pt alert and oriented in NAD. Pt verbalized understanding of discharge instructions. 

## 2018-01-08 NOTE — Discharge Instructions (Signed)
Tylenol as needed as directed. Follow up with OB- referral given, call the office tomorrow. Return to the ER or go to [facility] for any worsening or concerning symptoms.

## 2018-01-08 NOTE — ED Notes (Signed)
Patient returned from Ultrasound. 

## 2018-01-09 LAB — GC/CHLAMYDIA PROBE AMP (~~LOC~~) NOT AT ARMC
CHLAMYDIA, DNA PROBE: NEGATIVE
Neisseria Gonorrhea: NEGATIVE

## 2018-01-10 ENCOUNTER — Inpatient Hospital Stay (HOSPITAL_COMMUNITY)
Admission: AD | Admit: 2018-01-10 | Discharge: 2018-01-11 | Disposition: A | Discharge status: Home / Self Care | Payer: Self-pay | Source: Ambulatory Visit | Attending: Obstetrics & Gynecology | Admitting: Obstetrics & Gynecology

## 2018-01-10 ENCOUNTER — Inpatient Hospital Stay (HOSPITAL_COMMUNITY): Payer: Self-pay

## 2018-01-10 ENCOUNTER — Encounter (HOSPITAL_COMMUNITY): Payer: Self-pay

## 2018-01-10 DIAGNOSIS — Z3A08 8 weeks gestation of pregnancy: Secondary | ICD-10-CM

## 2018-01-10 DIAGNOSIS — O039 Complete or unspecified spontaneous abortion without complication: Secondary | ICD-10-CM | POA: Insufficient documentation

## 2018-01-10 LAB — CBC
HEMATOCRIT: 38 % (ref 36.0–46.0)
Hemoglobin: 13 g/dL (ref 12.0–15.0)
MCH: 28.4 pg (ref 26.0–34.0)
MCHC: 34.2 g/dL (ref 30.0–36.0)
MCV: 83.2 fL (ref 78.0–100.0)
Platelets: 225 10*3/uL (ref 150–400)
RBC: 4.57 MIL/uL (ref 3.87–5.11)
RDW: 12.5 % (ref 11.5–15.5)
WBC: 10.1 10*3/uL (ref 4.0–10.5)

## 2018-01-10 MED ORDER — PROMETHAZINE HCL 25 MG/ML IJ SOLN
12.5000 mg | Freq: Once | INTRAMUSCULAR | Status: AC
  Administered 2018-01-10: 12.5 mg via INTRAMUSCULAR
  Filled 2018-01-10: qty 1

## 2018-01-10 MED ORDER — HYDROMORPHONE HCL 1 MG/ML IJ SOLN
0.5000 mg | Freq: Once | INTRAMUSCULAR | Status: AC
  Administered 2018-01-10: 0.5 mg via INTRAMUSCULAR
  Filled 2018-01-10: qty 1

## 2018-01-10 NOTE — MAU Note (Signed)
Pt here with c/o vaginal bleeding and cramping. Was seen at [facility] on Saturday and had labs, [place], pelvic there. Pain is worse today and having some N/V.

## 2018-01-10 NOTE — MAU Provider Note (Signed)
Chief Complaint: Abdominal Pain and Vaginal Bleeding   First Provider Initiated Contact with Patient 01/10/18 2148     SUBJECTIVE HPI: Elizabeth Fisher is a 27 y.o. G1P0 at [redacted]w[redacted]d who presents to Maternity Admissions reporting abdominal pain & vaginal bleeding. Was seen at [facility] on 9/2 for vaginal bleeding. States she was told that they "couldn't tell what was going on with the pregnancy" & that she needed to f/u with an ob/gyn; she was given information on threatened miscarriage.  Symptoms worsened this morning with increase in pain & bleeding. Saturated 1 pad this afternoon while she was sleeping. Has passed some clots.   Location: abdomen Quality: cramping Severity: 10/10 on pain scale Duration: 1 day Timing: constant, worse in waves Modifying factors: none Associated signs and symptoms: vaginal bleeding  Past Medical History:  Diagnosis Date  . Exercise-induced asthma   . Sickle cell trait (HCC)   . 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 NO PAST SURGERIES     Social History   Socioeconomic History  . Marital status: Single    Spouse name: Not on file  . Number of children: Not on file  . Years of education: Not on file  . Highest education level: Not on file  Occupational History  . Not on file  Social Needs  . Financial resource strain: Not on file  . Food insecurity:    Worry: Not on file    Inability: Not on file  . Transportation needs:    Medical: Not on file    Non-medical: Not on file  Tobacco Use  . Smoking status: Never Smoker  . Smokeless tobacco: Never Used  Substance and Sexual Activity  . Alcohol use: No  . Drug use: No  . Sexual activity: Yes  Lifestyle  . Physical activity:    Days per week:  Not on file    Minutes per session: Not on file  . Stress: Not on file  Relationships  . Social connections:    Talks on phone: Not on file    Gets together: Not on file    Attends religious service: Not on file    Active member of club or organization: Not on file    Attends meetings of clubs or organizations: Not on file    Relationship status: Not on file  . Intimate partner violence:    Fear of current or ex partner: Not on file    Emotionally abused: Not on file    Physically abused: Not on file    Forced sexual activity: Not on file  Other Topics Concern  . Not on file  Social History Narrative  . Not on file   No family history on file. No current facility-administered medications on file prior to encounter.    Current Outpatient Medications on File Prior to Encounter  Medication Sig Dispense Refill  . prenatal vitamin w/FE, FA (PRENATAL 1 + 1) 27-1 MG TABS tablet Take 1 tablet by mouth daily at 12 noon.    Marland Kitchen acetaminophen (TYLENOL) 325 MG tablet Take  325-650 mg by mouth every 6 (six) hours as needed (for pain or headaches).    . clindamycin (CLEOCIN) 300 MG capsule Take 1 capsule (300 mg total) by mouth 4 (four) times daily. (Patient not taking: Reported on 01/08/2018) 28 capsule 0  . ibuprofen (ADVIL,MOTRIN) 200 MG tablet Take 200-400 mg by mouth every 6 (six) hours as needed (for pain or headaches).     Marland Kitchen omeprazole (PRILOSEC) 20 MG capsule Take 1 capsule (20 mg total) by mouth daily. (Patient not taking: Reported on 04/17/2017) 30 capsule 0  . ondansetron (ZOFRAN ODT) 4 MG disintegrating tablet Take 1 tablet (4 mg total) by mouth every 8 (eight) hours as needed for nausea or vomiting. (Patient not taking: Reported on 04/17/2017) 20 tablet 0  . ondansetron (ZOFRAN) 4 MG tablet Take 1 tablet (4 mg total) by mouth every 6 (six) hours. (Patient not taking: Reported on 01/08/2018) 12 tablet 0  . traMADol (ULTRAM) 50 MG tablet Take 1 tablet (50 mg total) by mouth every 6 (six) hours  as needed for severe pain. (Patient not taking: Reported on 01/08/2018) 10 tablet 0   Allergies  Allergen Reactions  . Blueberry Flavor Hives and Swelling    Hands swell  . Peach [Prunus Persica] Hives and Swelling    Hands swell  . Pineapple Hives and Swelling    Hands swell  . Shellfish Allergy Hives    I have reviewed patient's Past Medical Hx, Surgical Hx, Family Hx, Social Hx, medications and allergies.   Review of Systems  Constitutional: Negative.   Gastrointestinal: Positive for abdominal pain.  Genitourinary: Positive for vaginal bleeding.    OBJECTIVE Patient Vitals for the past 24 hrs:  BP Temp Temp src Pulse Resp SpO2 Height Weight  01/10/18 2128 135/89 98.3 F (36.8 C) Oral 78 18 100 % 5\' 4"  (1.626 m) 46.3 kg   Constitutional: Well-developed, well-nourished female in no acute distress.  Cardiovascular: normal rate & rhythm, no murmur Respiratory: normal rate and effort. Lung sounds clear throughout GI: Abd soft, non-tender, Pos BS x 4. No guarding or rebound tenderness MS: Extremities nontender, no edema, normal ROM Neurologic: Alert and oriented x 4.  GU:     SPECULUM EXAM: NEFG, moderate amount of pooling dark red blood. POC  pulled from cervix with ring forceps.    LAB RESULTS Results for orders placed or performed during the hospital encounter of 01/10/18 (from the past 24 hour(s))  CBC     Status: None   Collection Time: 01/10/18 10:12 PM  Result Value Ref Range   WBC 10.1 4.0 - 10.5 K/uL   RBC 4.57 3.87 - 5.11 MIL/uL   Hemoglobin 13.0 12.0 - 15.0 g/dL   HCT 16.1 09.6 - 04.5 %   MCV 83.2 78.0 - 100.0 fL   MCH 28.4 26.0 - 34.0 pg   MCHC 34.2 30.0 - 36.0 g/dL   RDW 40.9 81.1 - 91.4 %   Platelets 225 150 - 400 K/uL    IMAGING US Ob Transvaginal  Result Date: 01/10/2018 CLINICAL DATA:  Miscarriage at [redacted] weeks gestation. Estimated gestational age by LMP is 12 weeks 2 days. Quantitative beta HCG is 9,888. Patient was [facility] at [facility] on 09/02 and found  to have an 8 week 3 day sized embryo with absent fetal heart tones. Worsening pain today. EXAM: TRANSVAGINAL OB ULTRASOUND TECHNIQUE: Transvaginal ultrasound was performed for complete evaluation of the gestation as well as the maternal uterus, adnexal regions, and pelvic cul-de-sac. COMPARISON:  01/08/2018 FINDINGS: Intrauterine gestational sac: None Yolk sac:  Not Visualized. Embryo:  Not Visualized. Cardiac Activity: Not Visualized. Maternal uterus/adnexae: The uterus is mildly retroverted. Focal hypoechoic nodule in the anterior myometrium measuring 1.9 cm diameter consistent with a fibroid. The endometrium is thickened and heterogeneous, measuring about 1.4 cm. Swirling contents are demonstrated within the endometrium during real-time imaging. No significant endometrial flow is demonstrated on color flow Doppler imaging. Small amount of free fluid in the pelvis. The right ovary is visualized and appears normal. Left ovary is not seen. Bowel gas obscures visualization. IMPRESSION: 1. Interval loss of gestational sac and fetal pole since previous study consistent with failed pregnancy. 2. Residual endometrial thickening and heterogeneous endometrial contents may represent residual blood clots or retained products of conception. 3. Anterior uterine fibroid measuring 1.9 cm diameter. Electronically Signed   By: Burman Nieves M.D.   On: 01/10/2018 23:36    MAU COURSE Orders Placed This Encounter  Procedures  . US OB Transvaginal  . CBC  . Discharge patient   Meds ordered this encounter  Medications  . HYDROmorphone (DILAUDID) injection 0.5 mg  . promethazine (PHENERGAN) injection 12.5 mg  . misoprostol (CYTOTEC) tablet 600 mcg  . promethazine (PHENERGAN) 25 MG tablet    Sig: Take 1 tablet (25 mg total) by mouth every 6 (six) hours as needed for nausea or vomiting.    Dispense:  30 tablet    Refill:  0    Order Specific Question:   Supervising Provider    Answer:   Despina Hidden, LUTHER H [[id number]]  .  oxyCODONE-acetaminophen (PERCOCET/ROXICET) 5-325 MG tablet    Sig: Take 1 tablet by mouth every 4 (four) hours as needed.    Dispense:  6 tablet    Refill:  0    Order Specific Question:   Supervising Provider    Answer:   Lazaro Arms [[id number]]    MDM Per review of records, patient had ultrasound 2 days ago that confirmed fetal demise measuring [redacted]w[redacted]d. Pt states she was not given this information.  MBT AB positive CBC & repeat ultrasound ordered Pain medication given VSS, hemoglobin stable POC removed on exam. Ultrasound - IUP no longer present, some blood product left. Cytotec 600 mcg given prior to discharge   ASSESSMENT 1. Miscarriage   2. [redacted] weeks gestation of pregnancy     PLAN Discharge home in stable condition. BLeeding precautions Msg sent to [facility] for miscarriage follow up  Follow-up Information    [facility] Follow up.   Specialty:  Obstetrics and Gynecology Why:  Office will call you to schedule follow up appointment in 2 weeks Contact information: [address] [place] [address] [phone]         Allergies as of 01/11/2018      Reactions   Blueberry Flavor Hives, Swelling   Hands swell   Peach [prunus Persica] Hives, Swelling   Hands swell   Pineapple Hives, Swelling   Hands swell   Shellfish Allergy Hives      Medication List    STOP taking these medications   clindamycin 300 MG capsule Commonly known as:  CLEOCIN   omeprazole 20 MG capsule Commonly known as:  PRILOSEC   ondansetron 4 MG disintegrating tablet Commonly known as:  ZOFRAN-ODT   ondansetron 4 MG tablet Commonly known as:  ZOFRAN   traMADol 50 MG tablet Commonly known as:  ULTRAM     TAKE these medications   acetaminophen 325 MG  tablet Commonly known as:  TYLENOL Take 325-650 mg by mouth every 6 (six) hours as needed (for pain or headaches).   ibuprofen 200 MG tablet Commonly known as:  ADVIL,MOTRIN Take 200-400 mg by mouth  every 6 (six) hours as needed (for pain or headaches).   oxyCODONE-acetaminophen 5-325 MG tablet Commonly known as:  PERCOCET/ROXICET Take 1 tablet by mouth every 4 (four) hours as needed.   prenatal vitamin w/FE, FA 27-1 MG Tabs tablet Take 1 tablet by mouth daily at 12 noon.   promethazine 25 MG tablet Commonly known as:  PHENERGAN Take 1 tablet (25 mg total) by mouth every 6 (six) hours as needed for nausea or vomiting.        Judeth Horn, NP 01/11/2018  12:21 AM

## 2018-01-11 ENCOUNTER — Telehealth: Payer: Self-pay | Admitting: Family Medicine

## 2018-01-11 DIAGNOSIS — Z3A08 8 weeks gestation of pregnancy: Secondary | ICD-10-CM

## 2018-01-11 DIAGNOSIS — O039 Complete or unspecified spontaneous abortion without complication: Secondary | ICD-10-CM

## 2018-01-11 MED ORDER — OXYCODONE-ACETAMINOPHEN 5-325 MG PO TABS: 1.0000 | ORAL_TABLET | ORAL | 0 refills | Status: DC | PRN

## 2018-01-11 MED ORDER — PROMETHAZINE HCL 25 MG PO TABS: 25.0000 mg | ORAL_TABLET | Freq: Four times a day (QID) | ORAL | 0 refills | Status: DC | PRN

## 2018-01-11 MED ORDER — MISOPROSTOL 200 MCG PO TABS
600.0000 ug | ORAL_TABLET | Freq: Once | ORAL | Status: AC
  Administered 2018-01-11: 600 ug via BUCCAL
  Filled 2018-01-11: qty 3

## 2018-01-11 NOTE — Discharge Instructions (Signed)

## 2018-01-11 NOTE — Telephone Encounter (Signed)
Called and left a voicemail for the patient with her follow-up appt for 9/20 @ 10:35a. Also sent her a mychart message with the appt info. As well

## 2018-01-25 NOTE — Progress Notes (Deleted)
GYNECOLOGY OFFICE VISIT NOTE  History:  27 y.o. G1P0 here today for follow up of SAB. Patient was seen in [facility] for vaginal bleeding in early pregnancy. Had a sono that confirmed fetal demise at [redacted]w[redacted]d. Presented to [facility] 2 days later on 9/4 for increased pain and bleeding. Had exam with POC removed. Repeat sono showed IUP no longer present. Cytotec 600 mg was given prior to discharge. b-HCG on 9/2 was 9888. HgB was stable on 9.4 at 13.0 (>14 at [facility] on 9/2).   Today, patient reports ***.   Past Medical History:  Diagnosis Date  . Exercise-induced asthma   . Sickle cell trait (HCC)   . 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 NO PAST SURGERIES      The following portions of the patient's history were reviewed and updated as appropriate: allergies, current medications, past family history, past medical history, past social history, past surgical history and problem list.   Health Maintenance:  Normal pap and negative HRHPV on ***.  Normal mammogram on ***.   Review of Systems:  Pertinent items noted in HPI and remainder of comprehensive ROS otherwise negative.   Objective:  Physical Exam LMP 10/16/2017 (Exact Date)  CONSTITUTIONAL: Well-developed, well-nourished female in no acute distress.  HENT:  Normocephalic, atraumatic. External right and left ear normal. Oropharynx is clear and moist EYES: Conjunctivae and EOM are normal. Pupils are equal, round, and reactive to light. No scleral icterus.  NECK: Normal range of motion, supple, no masses SKIN: Skin is warm and dry. No rash noted. Not diaphoretic. No erythema. No pallor. NEUROLOGIC: Alert and oriented to person, place, and time. Normal reflexes, muscle tone coordination. No cranial nerve deficit noted. PSYCHIATRIC: Normal mood and affect. Normal behavior. Normal judgment and thought content. CARDIOVASCULAR: Normal heart rate noted RESPIRATORY: Effort and breath sounds normal,  no problems with respiration noted ABDOMEN: Soft, no distention noted.   PELVIC: {Blank single:19197::"Deferred","Normal appearing external genitalia; normal appearing vaginal mucosa and cervix.  No abnormal discharge noted.  Normal uterine size, no other palpable masses, no uterine or adnexal tenderness."} MUSCULOSKELETAL: Normal range of motion. No edema noted.  Labs and Imaging US Ob Comp < 14 Wks  Result Date: 01/08/2018 CLINICAL DATA:  Pelvic cramping and vaginal spotting for the past 3 days. Estimated gestational age of [redacted] weeks, 0 days by LMP. EXAM: OBSTETRIC <14 WK [place] AND TRANSVAGINAL OB US TECHNIQUE: Both transabdominal and transvaginal ultrasound examinations were performed for complete evaluation of the gestation as well as the maternal uterus, adnexal regions, and pelvic cul-de-sac. Transvaginal technique was performed to assess early pregnancy. COMPARISON:  None. FINDINGS: Intrauterine gestational sac: Single Yolk sac:  Not Visualized. Embryo:  Visualized. Cardiac Activity: Not Visualized. CRL:  18.5 mm   8 w   3 d                  [place] EDC: 08/17/2018 Subchorionic hemorrhage:  Small subchorionic hemorrhage. Maternal uterus/adnexae: 2.5 cm intramural fibroid in the anterior uterine fundus. Normal sonographic appearance of the ovaries. No free fluid. IMPRESSION: Crown-rump length of 18.5 mm without cardiac activity. Findings meet definitive criteria for failed pregnancy. This follows SRU consensus guidelines: Diagnostic Criteria for Nonviable Pregnancy Early in the First Trimester. Macy Mis J Med [phone]. Electronically Signed   By: Obie Dredge M.D.   On: 01/08/2018 11:44   US Ob Transvaginal  Result Date: 01/10/2018 CLINICAL DATA:  Miscarriage at [redacted] weeks gestation.  Estimated gestational age by LMP is 12 weeks 2 days. Quantitative beta HCG is 9,888. [facility] and found to have an 8 week 3 day sized embryo with absent fetal heart tones. Worsening pain  today. EXAM: TRANSVAGINAL OB ULTRASOUND TECHNIQUE: Transvaginal ultrasound was performed for complete evaluation of the gestation as well as the maternal uterus, adnexal regions, and pelvic cul-de-sac. COMPARISON:  01/08/2018 FINDINGS: Intrauterine gestational sac: None Yolk sac:  Not Visualized. Embryo:  Not Visualized. Cardiac Activity: Not Visualized. Maternal uterus/adnexae: The uterus is mildly retroverted. Focal hypoechoic nodule in the anterior myometrium measuring 1.9 cm diameter consistent with a fibroid. The endometrium is thickened and heterogeneous, measuring about 1.4 cm. Swirling contents are demonstrated within the endometrium during real-time imaging. No significant endometrial flow is demonstrated on color flow Doppler imaging. Small amount of free fluid in the pelvis. The right ovary is visualized and appears normal. Left ovary is not seen. Bowel gas obscures visualization. IMPRESSION: 1. Interval loss of gestational sac and fetal pole since previous study consistent with failed pregnancy. 2. Residual endometrial thickening and heterogeneous endometrial contents may represent residual blood clots or retained products of conception. 3. Anterior uterine fibroid measuring 1.9 cm diameter. Electronically Signed   By: Burman NievesWilliam  Stevens M.D.   On: 01/10/2018 23:36   [place] Ob Transvaginal  Result Date: 01/08/2018 CLINICAL DATA:  Pelvic cramping and vaginal spotting for the past 3 days. Estimated gestational age of [redacted] weeks, 0 days by LMP. EXAM: OBSTETRIC <14 WK US AND TRANSVAGINAL OB US TECHNIQUE: Both transabdominal and transvaginal ultrasound examinations were performed for complete evaluation of the gestation as well as the maternal uterus, adnexal regions, and pelvic cul-de-sac. Transvaginal technique was performed to assess early pregnancy. COMPARISON:  None. FINDINGS: Intrauterine gestational sac: Single Yolk sac:  Not Visualized. Embryo:  Visualized. Cardiac Activity: Not Visualized. CRL:  18.5 mm    8 w   3 d                  US EDC: 08/17/2018 Subchorionic hemorrhage:  Small subchorionic hemorrhage. Maternal uterus/adnexae: 2.5 cm intramural fibroid in the anterior uterine fundus. Normal sonographic appearance of the ovaries. No free fluid. IMPRESSION: Crown-rump length of 18.5 mm without cardiac activity. Findings meet definitive criteria for failed pregnancy. This follows SRU consensus guidelines: Diagnostic Criteria for Nonviable Pregnancy Early in the First Trimester. Macy Mis Engl J Med [phone] [id number]. Electronically Signed   By: Obie DredgeWilliam T Derry M.D.   On: 01/08/2018 11:44    Assessment & Plan:  1. Spontaneous miscarriage *** - Beta hCG quant (ref lab)   Routine preventative health maintenance measures emphasized. Please refer to After Visit Summary for other counseling recommendations.   No follow-ups on file.   Total face-to-face time with patient: *** minutes. Over 50% of encounter was spent on counseling and coordination of care.  Marcy Sirenatherine Wallace, D.O. OB Fellow  01/25/2018, 3:12 PM

## 2018-01-26 ENCOUNTER — Ambulatory Visit: Payer: Self-pay | Admitting: Internal Medicine

## 2018-05-09 NOTE — L&D Delivery Note (Signed)
Elizabeth Fisher [id number] 12-Jul-1990 IOL for CHTN with Cytotec, Foley Bulb, Pitocin, and AROM (1206) for management.  Given fentanyl for pain with good results.  GBS Negative.  1327: Nurse called and reports patient with spontaneous pushing and fetal station +4.  Provider at bedside and Cat I FT noted. Patient delivered as below without complications. Mother at bedside and supportive.   Delivery Note At 1:33 PM, on Sept 10, 2020, a viable female [name]" was delivered via Vaginal, Spontaneous (Presentation: Left Occiput Anterior with manual restitution to LOT).  Shoulders delivered easily and infant with good tone and spontaneous cry. Tactile stimulation given by provider and infant placed on mother's abdomen where nurse continued tactile stimulation.  Infant APGAR: 9, 9. After 6 minute delay, the umbilical cord was clamped, cut by support person, and blood collected by provider. Placenta delivered spontaneously via [name] and was noted to be intact with 3VC upon inspection.  Vaginal inspection revealed a 2nd degree perineal and left labial laceration that was repaired with 3-0 vicryl on CT-1 and SH, respectively. 35mL of Lidocaine 1% given for anesthetic and patient tolerated the procedure well with staff prompting and support. Fundus firm, at the umbilicus, and bleeding small.  Mother hemodynamically stable and infant skin to skin prior to provider exit.  Mother desires Depo for birth control and opts to breastfeed.  Infant weight at one hour of life: 6lbs 4.7 oz, 19 in  Anesthesia: Local for Repair Episiotomy: None Lacerations: 2nd degree;Perineal Suture Repair: 3.0 vicryl Est. Blood Loss (mL): 75  Mom to postpartum.  Baby to Couplet care / Skin to Skin.  Maryann Conners MSN, CNM 01/17/2019, 2:38 PM

## 2018-05-28 ENCOUNTER — Emergency Department (HOSPITAL_COMMUNITY)
Admission: EM | Admit: 2018-05-28 | Discharge: 2018-05-28 | Disposition: A | Discharge status: Home / Self Care | Payer: Medicaid Other | Attending: Emergency Medicine | Admitting: Emergency Medicine

## 2018-05-28 ENCOUNTER — Encounter (HOSPITAL_COMMUNITY): Payer: Self-pay | Admitting: *Deleted

## 2018-05-28 DIAGNOSIS — K047 Periapical abscess without sinus: Secondary | ICD-10-CM | POA: Diagnosis not present

## 2018-05-28 DIAGNOSIS — Z3A00 Weeks of gestation of pregnancy not specified: Secondary | ICD-10-CM | POA: Diagnosis not present

## 2018-05-28 DIAGNOSIS — O9989 Other specified diseases and conditions complicating pregnancy, childbirth and the puerperium: Secondary | ICD-10-CM | POA: Diagnosis not present

## 2018-05-28 MED ORDER — METOCLOPRAMIDE HCL 10 MG PO TABS: 10.0000 mg | ORAL_TABLET | Freq: Four times a day (QID) | ORAL | 0 refills | Status: DC | PRN

## 2018-05-28 MED ORDER — AMOXICILLIN 500 MG PO CAPS: 500.0000 mg | ORAL_CAPSULE | Freq: Three times a day (TID) | ORAL | 0 refills | Status: DC

## 2018-05-28 MED FILL — AMOXICILLIN 500 MG CAPSULE: 500 | 7 days supply | Qty: 21 | Fill #0

## 2018-05-28 MED FILL — METOCLOPRAMIDE 10 MG TABLET: 10 | 7 days supply | Qty: 28 | Fill #0

## 2018-05-28 NOTE — ED Provider Notes (Signed)
[facility] EMERGENCY DEPARTMENT Provider Note   CSN: [id number] Arrival date & time: 05/28/18  1042     History   Chief Complaint Chief Complaint  Patient presents with  . Facial Swelling    HPI Elizabeth Fisher is a 28 y.o. female G2P0010 presents to the emergency department today with chief complaint of dental pain.  This is been present for 3 days.  She first thought that she was grinding her teeth at night, but this morning when she woke up she noticed that the right side of her jaw was swollen.  She has taken ibuprofen for 2 days with transient relief.  She rates the pain 4 out of 10 in severity. Denies fever, neck pain, trismus, difficulty swallowing, sore throat. She had a positive home pregnancy test x 3 days ago and admits to associated nausea.  Past Medical History:  Diagnosis Date  . Exercise-induced asthma   . Sickle cell trait (HCC)   . 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 NO PAST SURGERIES       OB History    Gravida  1   Para      Term      Preterm      AB      Living        SAB      TAB      Ectopic      Multiple      Live Births               Home Medications    Prior to Admission medications   Medication Sig Start Date End Date Taking? Authorizing Provider  acetaminophen (TYLENOL) 325 MG tablet Take 325-650 mg by mouth every 6 (six) hours as needed (for pain or headaches).    [provider]  amoxicillin (AMOXIL) 500 MG capsule Take 1 capsule (500 mg total) by mouth 3 (three) times daily. 05/28/18   ,  E, PA-C  ibuprofen (ADVIL,MOTRIN) 200 MG tablet Take 200-400 mg by mouth every 6 (six) hours as needed (for pain or headaches).     [provider]  metoCLOPramide (REGLAN) 10 MG tablet Take 1 tablet (10 mg total) by mouth every 6 (six)  hours as needed for up to 7 days for nausea. 05/28/18 06/04/18  ,  E, PA-C  oxyCODONE-acetaminophen (PERCOCET/ROXICET) 5-325 MG tablet Take 1 tablet by mouth every 4 (four) hours as needed. 01/11/18   Judeth Horn, NP  prenatal vitamin w/FE, FA (PRENATAL 1 + 1) 27-1 MG TABS tablet Take 1 tablet by mouth daily at 12 noon.    [provider]  promethazine (PHENERGAN) 25 MG tablet Take 1 tablet (25 mg total) by mouth every 6 (six) hours as needed for nausea or vomiting. 01/11/18   Judeth Horn, NP    Family History History reviewed. No pertinent family history.  Social History Social History   Tobacco Use  . Smoking status: Never Smoker  . Smokeless tobacco: Never Used  Substance Use Topics  . Alcohol use: No  . Drug use: No     Allergies   Blueberry flavor; Peach [prunus persica]; Pineapple; and Shellfish allergy   Review of Systems Review of Systems  Constitutional: Negative for chills and fever.  HENT: Positive for dental problem and facial swelling. Negative for drooling, mouth sores,  sore throat, trouble swallowing and voice change.   Gastrointestinal: Positive for nausea.  Musculoskeletal: Negative for arthralgias, neck pain and neck stiffness.     Physical Exam Updated Vital Signs BP 133/89 (BP Location: Right Arm)   Pulse 85   Temp 98 F (36.7 C) (Oral)   Resp 20   LMP 10/16/2017 (Exact Date)   SpO2 100%   Physical Exam Constitutional:      General: She is not in acute distress.    Appearance: She is not ill-appearing.  HENT:     Head: Normocephalic and atraumatic.     Mouth/Throat:     Mouth: Mucous membranes are moist.     Pharynx: Oropharynx is clear.     Comments: Right lower second molar is decayed to level of gumline. No trismus. Fullness to buccal gingiva without fluctuance. Mouth opening to at least 3 finger widths. Handles oral secretions without difficulty. Minimal facial swelling to right lower jaw. No swelling or  tenderness to the submental or submandibular regions. No swelling or tenderness into the soft tissues of the neck. Normal phonation. Neck:     Musculoskeletal: Normal range of motion and neck supple. No muscular tenderness.  Cardiovascular:     Rate and Rhythm: Normal rate and regular rhythm.  Pulmonary:     Effort: Pulmonary effort is normal.  Skin:    General: Skin is warm and dry.  Neurological:     Mental Status: She is oriented to person, place, and time.  Psychiatric:        Behavior: Behavior normal.      ED Treatments / Results  Labs (all labs ordered are listed, but only abnormal results are displayed) Labs Reviewed - No data to display  EKG None  Radiology No results found.  Procedures Procedures (including critical care time)  Medications Ordered in ED Medications - No data to display   Initial Impression / Assessment and Plan / ED Course  I have reviewed the triage vital signs and the nursing notes.  Pertinent labs & imaging results that were available during my care of the patient were reviewed by me and considered in my medical decision making (see chart for details).    Patient with dental pain with minimal right lower jaw swelling. Pt is afebrile and well appearing. She is tender to palpation of right buccal gingiva only.  No gross abscess.  Exam unconcerning for Ludwig's angina or spread of infection.  Will treat with amoxicillin. Urged patient to follow-up with dentist. Pt has early pregnancy so OB referral given with discharge.  Discussed strict ED return precautions. Pt verbalized understanding of and is in agreement with this plan. Pt stable for discharge home at this time.   The patient was discussed with and seen by Dr. Hyacinth Meeker who agrees with the treatment plan.     Final Clinical Impressions(s) / ED Diagnoses   Final diagnoses:  Dental abscess    ED Discharge Orders         Ordered    metoCLOPramide (REGLAN) 10 MG tablet  Every 6  hours PRN     05/28/18 1148    amoxicillin (AMOXIL) 500 MG capsule  3 times daily     05/28/18 [address], Elizabeth Fisher 05/28/18 2155    Eber Hong, MD 05/30/18 1136

## 2018-05-28 NOTE — ED Provider Notes (Signed)
Medical screening examination/treatment/procedure(s) were conducted as a shared visit with non-physician practitioner(s) and myself.  I personally evaluated the patient during the encounter.  Clinical Impression:   Final diagnoses:  Dental abscess    Pt is 1st trim pregnant with 2nd preg (prior miscarriage) has R lower jaw swelling after a couple of days of R lower jaw pain - swelling is mild, has ttp over the gumline and has mild swelling but no fluctuance, no trismus or torticollis, no sublingual pain or ttp or swelling and no LAD of the neck,  Well appearing, early dental abscess, early pregnancy,  Amox, tylenol, referral for OBGYN for early Tawni Carnes, MD 05/30/18 1136

## 2018-05-28 NOTE — ED Triage Notes (Signed)
Pt in c/o right lower jaw pain for the last few days, today noticed swelling, denies fever, no distress noted, pt also reports she recently found out she is pregnant

## 2018-05-28 NOTE — Discharge Instructions (Signed)
You have been seen today for dental abscess. Please read and follow all provided instructions.   1. Medications: Prescription for Reglan for nausea to take as needed- please take as prescribed. Also prescription for Amoxicillin for your dental abscess, please take as prescribed. continue usual home medications 2. Treatment: rest, drink plenty of fluids 3. Follow Up: Please follow up with your primary doctor or OB in 2-5 days for discussion of your diagnoses and further evaluation after today's visit; if you do not have a primary care doctor use the resource guide provided to find one; Please return to the ER for any new or worsening symptoms. Please obtain all of your results from medical records or have your doctors office obtain the results - share them with your doctor - you should be seen at your doctors office. Call today to arrange your follow up.   Take medications as prescribed. Please review all of the medicines and only take them if you do not have an allergy to them. Return to the emergency room for worsening condition or new concerning symptoms. Follow up with your regular doctor. If you don't have a regular doctor use one of the numbers below to establish a primary care doctor.  ?  It is also a possibility that you have an allergic reaction to any of the medicines that you have been prescribed - Everybody reacts differently to medications and while MOST people have no trouble with most medicines, you may have a reaction such as nausea, vomiting, rash, swelling, shortness of breath. If this is the case, please stop taking the medicine immediately and contact your physician.  ?  You should return to the ER if you develop severe or worsening symptoms.

## 2018-06-05 ENCOUNTER — Emergency Department (HOSPITAL_COMMUNITY)
Admission: EM | Admit: 2018-06-05 | Discharge: 2018-06-05 | Disposition: A | Discharge status: Home / Self Care | Payer: Medicaid Other | Attending: Emergency Medicine | Admitting: Emergency Medicine

## 2018-06-05 DIAGNOSIS — R197 Diarrhea, unspecified: Secondary | ICD-10-CM | POA: Diagnosis not present

## 2018-06-05 DIAGNOSIS — R1013 Epigastric pain: Secondary | ICD-10-CM | POA: Insufficient documentation

## 2018-06-05 DIAGNOSIS — O219 Vomiting of pregnancy, unspecified: Secondary | ICD-10-CM | POA: Diagnosis present

## 2018-06-05 DIAGNOSIS — O9989 Other specified diseases and conditions complicating pregnancy, childbirth and the puerperium: Secondary | ICD-10-CM | POA: Insufficient documentation

## 2018-06-05 DIAGNOSIS — Z3491 Encounter for supervision of normal pregnancy, unspecified, first trimester: Secondary | ICD-10-CM

## 2018-06-05 DIAGNOSIS — Z3A00 Weeks of gestation of pregnancy not specified: Secondary | ICD-10-CM | POA: Diagnosis not present

## 2018-06-05 DIAGNOSIS — R112 Nausea with vomiting, unspecified: Secondary | ICD-10-CM

## 2018-06-05 DIAGNOSIS — J45909 Unspecified asthma, uncomplicated: Secondary | ICD-10-CM | POA: Diagnosis not present

## 2018-06-05 LAB — COMPREHENSIVE METABOLIC PANEL
ALK PHOS: 49 U/L (ref 38–126)
ALT: 16 U/L (ref 0–44)
ANION GAP: 15 (ref 5–15)
AST: 17 U/L (ref 15–41)
Albumin: 4.2 g/dL (ref 3.5–5.0)
BILIRUBIN TOTAL: 0.8 mg/dL (ref 0.3–1.2)
BUN: 12 mg/dL (ref 6–20)
CALCIUM: 9.5 mg/dL (ref 8.9–10.3)
CO2: 21 mmol/L — ABNORMAL LOW (ref 22–32)
CREATININE: 0.6 mg/dL (ref 0.44–1.00)
Chloride: 99 mmol/L (ref 98–111)
GFR calc non Af Amer: >60 mL/min (ref >60)
GLUCOSE: 95 mg/dL (ref 70–99)
Potassium: 3.2 mmol/L — ABNORMAL LOW (ref 3.5–5.1)
Sodium: 135 mmol/L (ref 135–145)
TOTAL PROTEIN: 8 g/dL (ref 6.5–8.1)

## 2018-06-05 LAB — CBC WITH DIFFERENTIAL/PLATELET
Abs Immature Granulocytes: 0.02 10*3/uL (ref 0.00–0.07)
BASOS ABS: 0 10*3/uL (ref 0.0–0.1)
Basophils Relative: 1 %
EOS ABS: 0 10*3/uL (ref 0.0–0.5)
EOS PCT: 0 %
HEMATOCRIT: 41.5 % (ref 36.0–46.0)
Hemoglobin: 14.2 g/dL (ref 12.0–15.0)
Immature Granulocytes: 0 %
LYMPHS ABS: 1.4 10*3/uL (ref 0.7–4.0)
Lymphocytes Relative: 17 %
MCH: 27.7 pg (ref 26.0–34.0)
MCHC: 34.2 g/dL (ref 30.0–36.0)
MCV: 81.1 fL (ref 80.0–100.0)
MONO ABS: 0.9 10*3/uL (ref 0.1–1.0)
Monocytes Relative: 10 %
NRBC: 0 % (ref 0.0–0.2)
Neutro Abs: 5.9 10*3/uL (ref 1.7–7.7)
Neutrophils Relative %: 72 %
Platelets: 316 10*3/uL (ref 150–400)
RBC: 5.12 MIL/uL — ABNORMAL HIGH (ref 3.87–5.11)
RDW: 11.6 % (ref 11.5–15.5)
WBC: 8.2 10*3/uL (ref 4.0–10.5)

## 2018-06-05 LAB — I-STAT BETA HCG BLOOD, ED (MC, WL, AP ONLY)

## 2018-06-05 LAB — URINALYSIS, ROUTINE W REFLEX MICROSCOPIC
Bilirubin Urine: NEGATIVE
Glucose, UA: 50 mg/dL — AB
Hgb urine dipstick: NEGATIVE
Ketones, ur: 80 mg/dL — AB
Leukocytes, UA: NEGATIVE
Nitrite: NEGATIVE
PROTEIN: 30 mg/dL — AB
SPECIFIC GRAVITY, URINE: 1.03 (ref 1.005–1.030)
pH: 5 (ref 5.0–8.0)

## 2018-06-05 LAB — HCG, QUANTITATIVE, PREGNANCY: hCG, Beta Chain, Quant, S: 43968 m[IU]/mL — ABNORMAL HIGH (ref <5)

## 2018-06-05 LAB — LIPASE, BLOOD: Lipase: 24 U/L (ref 11–51)

## 2018-06-05 MED ORDER — SODIUM CHLORIDE 0.9 % IV BOLUS
1000.0000 mL | Freq: Once | INTRAVENOUS | Status: AC
  Administered 2018-06-05: 500 mL via INTRAVENOUS

## 2018-06-05 MED ORDER — ONDANSETRON HCL 4 MG/2ML IJ SOLN
4.0000 mg | Freq: Once | INTRAMUSCULAR | Status: AC
  Administered 2018-06-05: 4 mg via INTRAVENOUS
  Filled 2018-06-05: qty 2

## 2018-06-05 MED ORDER — ONDANSETRON 4 MG PO TBDP
4.0000 mg | ORAL_TABLET | Freq: Once | ORAL | Status: DC
  Filled 2018-06-05: qty 1

## 2018-06-05 MED ORDER — ONDANSETRON 4 MG PO TBDP: 4.0000 mg | ORAL_TABLET | Freq: Three times a day (TID) | ORAL | 0 refills | Status: DC | PRN

## 2018-06-05 NOTE — ED Provider Notes (Signed)
[facility]   CSN: [id number] Arrival date & time: 06/05/18  1048     History   Chief Complaint Chief Complaint  Patient presents with  . Abdominal Pain  . Nausea  . Diarrhea    HPI Elizabeth Fisher is a 28 y.o. female with h/o miscarriage, GERD, is here for evaluation of nausea, vomiting onset Friday. Sudden. She was at bingo and smelled cigarette smoke which made her nauseous and she has been vomiting since.  Initially had diarrhea but this resolved.  Associated with periumbilical "soreness" and cramping, intermittent.  Has been tolerating ice chips and sips of gatorade but has not had anything to eat since Friday.  She took two pregnancy tests that were positive on the 12th.  LMP April 18 2018.  No ETOH use. Previous marijuana use 2-3x a week but stopped after positive pregnancy test.  No abdominal surgeries. Denies fever, cough, congestion, sore throat, myalgias, blood in stool, hematemesis, dysuria, hematuria, increased urinary frequency or urgency.  HPI  Past Medical History:  Diagnosis Date  . Exercise-induced asthma   . Sickle cell trait (HCC)   . 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 NO PAST SURGERIES       OB History    Gravida  1   Para      Term      Preterm      AB      Living        SAB      TAB      Ectopic      Multiple      Live Births               Home Medications    Prior to Admission medications   Medication Sig Start Date End Date Taking? Authorizing Provider  acetaminophen (TYLENOL) 325 MG tablet Take 325-650 mg by mouth every 6 (six) hours as needed (for pain or headaches).   Yes [provider]  amoxicillin (AMOXIL) 500 MG capsule Take 1 capsule (500 mg total) by mouth 3 (three) times daily. 05/28/18  Yes Albrizze,  Kaitlyn E, PA-C  ibuprofen (ADVIL,MOTRIN) 200 MG tablet Take 200-400 mg by mouth every 6 (six) hours as needed (for pain or headaches).    Yes [provider]  metoCLOPramide (REGLAN) 10 MG tablet Take 1 tablet (10 mg total) by mouth every 6 (six) hours as needed for up to 7 days for nausea. 05/28/18 06/05/18 Yes Albrizze, Kaitlyn E, PA-C  ondansetron (ZOFRAN ODT) 4 MG disintegrating tablet Take 1 tablet (4 mg total) by mouth every 8 (eight) hours as needed for nausea or vomiting. 06/05/18   Liberty HandyGibbons, Prerna Harold J, PA-C    Family History No family history on file.  Social History Social History   Tobacco Use  . Smoking status: Never Smoker  . Smokeless tobacco: Never Used  Substance Use Topics  . Alcohol use: No  . Drug use: No     Allergies   Blueberry flavor; Peach [prunus persica]; Pineapple; and Shellfish allergy   Review of Systems Review of Systems  Gastrointestinal: Positive for abdominal pain, nausea and vomiting.  All other systems reviewed and are negative.    Physical Exam Updated Vital Signs BP 132/86 (BP Location: Right Arm)   Pulse 68   Temp 98.3  F (36.8 C)   Resp 16   LMP 10/16/2017 (Exact Date)   SpO2 100%   Physical Exam Vitals signs and nursing note reviewed.  Constitutional:      Appearance: She is well-developed.     Comments: Non toxic  HENT:     Head: Normocephalic and atraumatic.     Nose: Nose normal.     Mouth/Throat:     Comments: MMM Eyes:     Conjunctiva/sclera: Conjunctivae normal.     Pupils: Pupils are equal, round, and reactive to light.  Neck:     Musculoskeletal: Normal range of motion.  Cardiovascular:     Rate and Rhythm: Normal rate and regular rhythm.  Pulmonary:     Effort: Pulmonary effort is normal.     Breath sounds: Normal breath sounds.  Abdominal:     General: Bowel sounds are normal.     Palpations: Abdomen is soft.     Tenderness: There is abdominal tenderness in the epigastric area.     Comments: No  G/R/R. No suprapubic or CVA tenderness. Negative Murphy's and McBurney's. Active BS to lower quadrants.   Musculoskeletal: Normal range of motion.  Skin:    General: Skin is warm and dry.     Capillary Refill: Capillary refill takes less than 2 seconds.  Neurological:     Mental Status: She is alert and oriented to person, place, and time.  Psychiatric:        Behavior: Behavior normal.      ED Treatments / Results  Labs (all labs ordered are listed, but only abnormal results are displayed) Labs Reviewed  COMPREHENSIVE METABOLIC PANEL - Abnormal; Notable for the following components:      Result Value   Potassium 3.2 (*)    CO2 21 (*)    All other components within normal limits  CBC WITH DIFFERENTIAL/PLATELET - Abnormal; Notable for the following components:   RBC 5.12 (*)    All other components within normal limits  URINALYSIS, ROUTINE W REFLEX MICROSCOPIC - Abnormal; Notable for the following components:   APPearance HAZY (*)    Glucose, UA 50 (*)    Ketones, ur 80 (*)    Protein, ur 30 (*)    Bacteria, UA RARE (*)    All other components within normal limits  HCG, QUANTITATIVE, PREGNANCY - Abnormal; Notable for the following components:   hCG, Beta Chain, Quant, S [id number] (*)    All other components within normal limits  I-STAT BETA HCG BLOOD, ED ([facility], [facility], [facility] ONLY) - Abnormal; Notable for the following components:   I-stat hCG, quantitative >2,000.0 (*)    All other components within normal limits  LIPASE, BLOOD    EKG None  Radiology No results found.  Procedures Procedures (including critical care time)  Medications Ordered in ED Medications  ondansetron (ZOFRAN-ODT) disintegrating tablet 4 mg (has no administration in time range)  ondansetron (ZOFRAN) injection 4 mg (4 mg Intravenous Given 06/05/18 1903)  sodium chloride 0.9 % bolus 1,000 mL (0 mLs Intravenous Stopped 06/05/18 2110)     Initial Impression / Assessment and Plan / ED Course  I have  reviewed the triage vital signs and the nursing notes.  Pertinent labs & imaging results that were available during my care of the patient were reviewed by me and considered in my medical decision making (see chart for details).  Clinical Course as of Jun 05 2212  Tue Jun 05, 2018  1730 Pt not in room    [  CG]    Clinical Course User Index [CG] Liberty Handy, PA-C   High suspicion for pregnancy related nausea/vomiting also considering a virus.  She has epigastric discomfort.  She has history of GERD and gastritis as well so this could be contributing.  Given symptoms and exam I doubt cholecystitis, appendicitis, UTI/pyelonephritis, diverticulitis or perforated viscus.  In regards to pregnancy, she has no complaints including vaginal bleeding, urinary symptoms, lower abdominal or pelvic pain.  hCG quant/qualitative consistent with pregnancy.  No signs of infection in the urine.  80 ketones noted in urine consistent with mild dehydration although clinically she has moist mucous membranes.  K3.2.  Patient given IV fluids, Zofran and tolerating fluid and food in the ER.  She feels much better.  I do not see indication for admission, emergent imaging.  Repeat abdominal exam has improved, she has no tenderness.  We will discharge with Zofran, symptomatic management.  She is to follow-up with OB/GYN for routine prenatal care.  Discussed return precautions, she is in agreement and comfortable with this.  Final Clinical Impressions(s) / ED Diagnoses   Final diagnoses:  Nausea and vomiting in adult  First trimester pregnancy    ED Discharge Orders         Ordered    ondansetron (ZOFRAN ODT) 4 MG disintegrating tablet  Every 8 hours PRN     06/05/18 2046           Liberty Handy, PA-C 06/05/18 2214    Melene Plan, DO 06/06/18 5621

## 2018-06-05 NOTE — ED Triage Notes (Signed)
Pt is also approx [redacted] weeks pregnant

## 2018-06-05 NOTE — ED Triage Notes (Signed)
Pt here with abd pain and n/v/d times 3 days , pt unable to keep anything down

## 2018-06-05 NOTE — ED Notes (Signed)
Updated on wait for treatment room. 

## 2018-06-05 NOTE — Discharge Instructions (Addendum)
You were seen in the ER for nausea, vomiting and abdominal cramping.  This could have been from a virus or possibly related to first trimester pregnancy.  Lab work looked okay.  Urine looked like you were dehydrated but there was no infection.  Zofran has been prescribed to use as needed for nausea.  Stay well-hydrated.  Start regular diet as tolerated.  Avoid heavy, greasy or irritating foods and drinks.  Return to the ER if symptoms do not improve or are worsening despite nausea medicine or there is fever, severe localized abdominal pain, urinary symptoms, blood in your stools, vaginal bleeding.  Follow-up with OB/GYN for routine prenatal care.  Do not take ibuprofen containing products during pregnancy.  You may take Tylenol.

## 2018-06-06 MED FILL — ONDANSETRON ODT 4 MG TABLET: 4 | 7 days supply | Qty: 20 | Fill #0

## 2018-06-18 ENCOUNTER — Encounter (HOSPITAL_COMMUNITY): Payer: Self-pay

## 2018-06-18 ENCOUNTER — Emergency Department (HOSPITAL_COMMUNITY)
Admission: EM | Admit: 2018-06-18 | Discharge: 2018-06-18 | Disposition: A | Discharge status: Home / Self Care | Payer: Medicaid Other | Attending: Emergency Medicine | Admitting: Emergency Medicine

## 2018-06-18 ENCOUNTER — Ambulatory Visit: Payer: Self-pay

## 2018-06-18 DIAGNOSIS — Z79899 Other long term (current) drug therapy: Secondary | ICD-10-CM | POA: Insufficient documentation

## 2018-06-18 DIAGNOSIS — O219 Vomiting of pregnancy, unspecified: Secondary | ICD-10-CM | POA: Insufficient documentation

## 2018-06-18 LAB — CBC WITH DIFFERENTIAL/PLATELET
Abs Immature Granulocytes: 0.02 10*3/uL (ref 0.00–0.07)
BASOS ABS: 0 10*3/uL (ref 0.0–0.1)
BASOS PCT: 0 %
EOS ABS: 0 10*3/uL (ref 0.0–0.5)
EOS PCT: 0 %
HCT: 43.9 % (ref 36.0–46.0)
Hemoglobin: 14.5 g/dL (ref 12.0–15.0)
Immature Granulocytes: 0 %
LYMPHS PCT: 7 %
Lymphs Abs: 0.6 10*3/uL — ABNORMAL LOW (ref 0.7–4.0)
MCH: 27.3 pg (ref 26.0–34.0)
MCHC: 33 g/dL (ref 30.0–36.0)
MCV: 82.5 fL (ref 80.0–100.0)
MONO ABS: 0.6 10*3/uL (ref 0.1–1.0)
Monocytes Relative: 7 %
NRBC: 0 % (ref 0.0–0.2)
Neutro Abs: 7.2 10*3/uL (ref 1.7–7.7)
Neutrophils Relative %: 86 %
PLATELETS: 254 10*3/uL (ref 150–400)
RBC: 5.32 MIL/uL — AB (ref 3.87–5.11)
RDW: 12.3 % (ref 11.5–15.5)
WBC: 8.4 10*3/uL (ref 4.0–10.5)

## 2018-06-18 LAB — COMPREHENSIVE METABOLIC PANEL
ALT: 15 U/L (ref 0–44)
ANION GAP: 12 (ref 5–15)
AST: 19 U/L (ref 15–41)
Albumin: 4.9 g/dL (ref 3.5–5.0)
Alkaline Phosphatase: 48 U/L (ref 38–126)
BILIRUBIN TOTAL: 0.5 mg/dL (ref 0.3–1.2)
BUN: 17 mg/dL (ref 6–20)
CHLORIDE: 101 mmol/L (ref 98–111)
CO2: 22 mmol/L (ref 22–32)
Calcium: 9.7 mg/dL (ref 8.9–10.3)
Creatinine, Ser: 0.58 mg/dL (ref 0.44–1.00)
Glucose, Bld: 86 mg/dL (ref 70–99)
POTASSIUM: 3.9 mmol/L (ref 3.5–5.1)
Sodium: 135 mmol/L (ref 135–145)
TOTAL PROTEIN: 8.7 g/dL — AB (ref 6.5–8.1)

## 2018-06-18 LAB — URINALYSIS, ROUTINE W REFLEX MICROSCOPIC
Bilirubin Urine: NEGATIVE
GLUCOSE, UA: 50 mg/dL — AB
HGB URINE DIPSTICK: NEGATIVE
Ketones, ur: 80 mg/dL — AB
Leukocytes, UA: NEGATIVE
NITRITE: NEGATIVE
PROTEIN: 30 mg/dL — AB
SPECIFIC GRAVITY, URINE: 1.031 — AB (ref 1.005–1.030)
pH: 5 (ref 5.0–8.0)

## 2018-06-18 LAB — I-STAT BETA HCG BLOOD, ED (MC, WL, AP ONLY): I-stat hCG, quantitative: >2000 m[IU]/mL — ABNORMAL HIGH (ref <5)

## 2018-06-18 MED ORDER — LACTATED RINGERS IV BOLUS
1000.0000 mL | Freq: Once | INTRAVENOUS | Status: AC
  Administered 2018-06-18: 1000 mL via INTRAVENOUS

## 2018-06-18 MED ORDER — ONDANSETRON HCL 4 MG/2ML IJ SOLN
4.0000 mg | Freq: Once | INTRAMUSCULAR | Status: AC
  Administered 2018-06-18: 4 mg via INTRAVENOUS
  Filled 2018-06-18: qty 2

## 2018-06-18 MED ORDER — METOCLOPRAMIDE HCL 10 MG PO TABS: 10.0000 mg | ORAL_TABLET | Freq: Four times a day (QID) | ORAL | 0 refills | Status: DC

## 2018-06-18 NOTE — ED Triage Notes (Signed)
Pt presents with c/o emesis during pregnancy. Pt reports she is unsure of how far along she is but believes herself to be no more than 8 or 9 weeks.

## 2018-06-18 NOTE — ED Notes (Signed)
Pt is alert and oriented x 4 and is verbally responsive. Pt reports that she has vomited 10-15 times in the past 24 hours, and is unable to food and fluids down. Pt states that she took Zofran a few days ago but threw it up.

## 2018-06-18 NOTE — ED Provider Notes (Signed)
[facility]   CSN: [id number] Arrival date & time: 06/18/18  1123     History   Chief Complaint Chief Complaint  Patient presents with  . Emesis During Pregnancy    HPI Elizabeth Fisher is a 28 y.o. female with history of sickle cell trait presenting to emergency department with chief complaint of nausea and vomiting.  She has been unable to tolerate anything p.o. for 1 week.  She was seen in the emergency department on 06/05/2018 where she was told she was pregnant she was discharged with Zofran.  She said she had relief with Zofran, but has since run out. ever since then she has had daily vomiting.  She estimates 10-15 times in the last 24 hours.  She reports her emesis is non bloody and nonbilious.  She admits she feels like she has lost weight saying that all of her clothes they used to be tight are now much looser.  She has not weighed herself however.  She is not sure how far along she is but thinks it is around 8 or 9 weeks. Denies fever, abdominal pain, urinary symptoms, diarrhea.  Past Medical History:  Diagnosis Date  . Exercise-induced asthma   . Sickle cell trait (HCC)   . 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 NO PAST SURGERIES       OB History    Gravida  1   Para      Term      Preterm      AB      Living        SAB      TAB      Ectopic      Multiple      Live Births               Home Medications    Prior to Admission medications   Medication Sig Start Date End Date Taking? Authorizing Provider  ondansetron (ZOFRAN ODT) 4 MG disintegrating tablet Take 1 tablet (4 mg total) by mouth every 8 (eight) hours as needed for nausea or vomiting. 06/05/18  Yes Liberty HandyGibbons, Claudia J, PA-C  polyvinyl alcohol (LIQUIFILM TEARS) 1.4 % ophthalmic solution Place  1 drop into both eyes as needed for dry eyes.   Yes [provider]  promethazine (PHENERGAN) 25 MG tablet Take 25 mg by mouth every 6 (six) hours as needed for nausea or vomiting.   Yes [provider]  amoxicillin (AMOXIL) 500 MG capsule Take 1 capsule (500 mg total) by mouth 3 (three) times daily. Patient not taking: Reported on 06/18/2018 05/28/18   Albrizze, Yvonna AlanisKaitlyn E, PA-C  metoCLOPramide (REGLAN) 10 MG tablet Take 1 tablet (10 mg total) by mouth every 6 (six) hours. 06/18/18   Albrizze, Caroleen HammanKaitlyn E, PA-C    Family History History reviewed. No pertinent family history.  Social History Social History   Tobacco Use  . Smoking status: Never Smoker  . Smokeless tobacco: Never Used  Substance Use Topics  . Alcohol use: No  . Drug use: No     Allergies   Blueberry flavor; Peach [prunus persica]; Pineapple; and Shellfish allergy   Review of Systems Review of Systems  Constitutional: Negative for chills and fever.  HENT: Negative for congestion, sinus pressure and sore throat.   Eyes: Negative for  pain and visual disturbance.  Respiratory: Negative for chest tightness and shortness of breath.   Cardiovascular: Negative for chest pain and palpitations.  Gastrointestinal: Positive for nausea and vomiting. Negative for abdominal pain and diarrhea.  Genitourinary: Negative for difficulty urinating and hematuria.  Musculoskeletal: Negative for back pain and neck pain.  Skin: Negative for rash and wound.  Neurological: Negative for syncope and headaches.     Physical Exam Updated Vital Signs BP 137/85 (BP Location: Right Arm)   Pulse 82   Temp 98.2 F (36.8 C) (Oral)   Resp 16   Ht 5\' 4"  (1.626 m)   Wt 45.4 kg   LMP 04/18/2018   SpO2 100%   BMI 17.16 kg/m   Physical Exam Vitals signs and nursing note reviewed.  Constitutional:      Appearance: She is not ill-appearing or toxic-appearing.  HENT:     Head: Normocephalic and atraumatic.     Nose: Nose  normal.     Mouth/Throat:     Mouth: Mucous membranes are moist.     Pharynx: Oropharynx is clear.  Eyes:     Conjunctiva/sclera: Conjunctivae normal.  Neck:     Musculoskeletal: Normal range of motion.  Cardiovascular:     Rate and Rhythm: Normal rate and regular rhythm.     Pulses: Normal pulses.     Heart sounds: Normal heart sounds.  Pulmonary:     Effort: Pulmonary effort is normal.     Breath sounds: Normal breath sounds.  Abdominal:     General: There is no distension.     Palpations: Abdomen is soft.     Tenderness: There is no abdominal tenderness. There is no guarding or rebound.  Musculoskeletal: Normal range of motion.  Skin:    General: Skin is warm and dry.     Capillary Refill: Capillary refill takes less than 2 seconds.  Neurological:     Mental Status: She is alert. Mental status is at baseline.     Motor: No weakness.  Psychiatric:        Behavior: Behavior normal.      ED Treatments / Results  Labs (all labs ordered are listed, but only abnormal results are displayed) Labs Reviewed  COMPREHENSIVE METABOLIC PANEL - Abnormal; Notable for the following components:      Result Value   Total Protein 8.7 (*)    All other components within normal limits  CBC WITH DIFFERENTIAL/PLATELET - Abnormal; Notable for the following components:   RBC 5.32 (*)    Lymphs Abs 0.6 (*)    All other components within normal limits  URINALYSIS, ROUTINE W REFLEX MICROSCOPIC - Abnormal; Notable for the following components:   APPearance HAZY (*)    Specific Gravity, Urine 1.031 (*)    Glucose, UA 50 (*)    Ketones, ur 80 (*)    Protein, ur 30 (*)    Bacteria, UA RARE (*)    All other components within normal limits  I-STAT BETA HCG BLOOD, ED ([facility], [facility], [facility] ONLY) - Abnormal; Notable for the following components:   I-stat hCG, quantitative >2,000.0 (*)    All other components within normal limits    EKG None  Radiology No results found.  Procedures Procedures  (including critical care time)  Medications Ordered in ED Medications  lactated ringers bolus 1,000 mL (0 mLs Intravenous Stopped 06/18/18 1851)  ondansetron (ZOFRAN) injection 4 mg (4 mg Intravenous Given 06/18/18 1550)     Initial Impression / Assessment and Plan /  ED Course  I have reviewed the triage vital signs and the nursing notes.  Pertinent labs & imaging results that were available during my care of the patient were reviewed by me and considered in my medical decision making (see chart for details).  Pt is well appearing, non toxic. She has been unable to follow up with OB yet because she is getting her insurance straightened out. She thinks she will have insurance by 06/27/2018. Today she's presenting with nausea and vomiting that has been present daily, lasting most of the day. She was seen here on 06/05/2018 and given zofran which helped her symptoms but she has since ran out. Labs today are unremarkable including CBC and CMP. UA shows signs of dehydration and no signs of UTI. Pt is afebrile, on exam her abdomen is non tender, she denies vaginal bleeding, making ectopic pregnancy less likely. Pt is tolerating PO fluids and crackers. Will discharge home with Reglan and have her follow up with OB.   Discussed strict ED return precautions and warning signs for ectopi pregancy. Pt verbalized understanding of and is in agreement with this plan. Pt stable for discharge home at this time.Pt case discussed with Dr. Criss AlvineGoldston who agrees with my plan.      Final Clinical Impressions(s) / ED Diagnoses   Final diagnoses:  Nausea/vomiting in pregnancy    ED Discharge Orders         Ordered    metoCLOPramide (REGLAN) 10 MG tablet  Every 6 hours     06/18/18 1926           Sherene Sireslbrizze, Kaitlyn E, PA-C 06/19/18 1309    Pricilla LovelessGoldston, Scott, MD 06/21/18 1601

## 2018-06-18 NOTE — ED Notes (Signed)
Bed: WA09 Expected date:  Expected time:  Means of arrival:  Comments: EVS 

## 2018-06-18 NOTE — Discharge Instructions (Signed)
You have been seen today for nausea and vomiting. Please read and follow all provided instructions. Return to the emergency room for worsening condition or new concerning symptoms.    1. Medications:  Prescription sent to your pharmacy for reglan.  It is for nausea, please take as prescribed.  Continue usual home medications Take medications as prescribed. Please review all of the medicines and only take them if you do not have an allergy to them.  2. Treatment: rest, drink plenty of fluids 3. Follow Up: Please follow up with OB as soon as you are able to.  call today to arrange your follow up.  If you do not have a primary care doctor use the resource guide provided to find one;   It is also a possibility that you have an allergic reaction to any of the medicines that you have been prescribed - Everybody reacts differently to medications and while MOST people have no trouble with most medicines, you may have a reaction such as nausea, vomiting, rash, swelling, shortness of breath. If this is the case, please stop taking the medicine immediately and contact your physician.  ?

## 2018-06-22 ENCOUNTER — Other Ambulatory Visit: Payer: Self-pay

## 2018-06-22 ENCOUNTER — Inpatient Hospital Stay (HOSPITAL_COMMUNITY)
Admission: AD | Admit: 2018-06-22 | Discharge: 2018-06-24 | DRG: 833 | Disposition: A | Discharge status: Home / Self Care | Payer: Medicaid Other | Attending: Obstetrics and Gynecology | Admitting: Obstetrics and Gynecology

## 2018-06-22 ENCOUNTER — Encounter (HOSPITAL_COMMUNITY): Payer: Self-pay | Admitting: *Deleted

## 2018-06-22 DIAGNOSIS — O99011 Anemia complicating pregnancy, first trimester: Secondary | ICD-10-CM | POA: Diagnosis present

## 2018-06-22 DIAGNOSIS — K5900 Constipation, unspecified: Secondary | ICD-10-CM | POA: Diagnosis present

## 2018-06-22 DIAGNOSIS — O211 Hyperemesis gravidarum with metabolic disturbance: Secondary | ICD-10-CM | POA: Diagnosis present

## 2018-06-22 DIAGNOSIS — D573 Sickle-cell trait: Secondary | ICD-10-CM | POA: Diagnosis present

## 2018-06-22 DIAGNOSIS — O131 Gestational [pregnancy-induced] hypertension without significant proteinuria, first trimester: Secondary | ICD-10-CM | POA: Diagnosis present

## 2018-06-22 DIAGNOSIS — O210 Mild hyperemesis gravidarum: Secondary | ICD-10-CM

## 2018-06-22 DIAGNOSIS — O139 Gestational [pregnancy-induced] hypertension without significant proteinuria, unspecified trimester: Secondary | ICD-10-CM | POA: Diagnosis present

## 2018-06-22 DIAGNOSIS — Z3A09 9 weeks gestation of pregnancy: Secondary | ICD-10-CM

## 2018-06-22 DIAGNOSIS — O219 Vomiting of pregnancy, unspecified: Secondary | ICD-10-CM

## 2018-06-22 HISTORY — DX: Vomiting, unspecified: R11.10

## 2018-06-22 LAB — URINALYSIS, MICROSCOPIC (REFLEX)

## 2018-06-22 LAB — URINALYSIS, ROUTINE W REFLEX MICROSCOPIC
Glucose, UA: NEGATIVE mg/dL
Ketones, ur: 40 mg/dL — AB
LEUKOCYTE UA: NEGATIVE
Nitrite: NEGATIVE
PH: 6 (ref 5.0–8.0)
Protein, ur: NEGATIVE mg/dL
Specific Gravity, Urine: 1.025 (ref 1.005–1.030)

## 2018-06-22 LAB — TYPE AND SCREEN
ABO/RH(D): AB POS
Antibody Screen: NEGATIVE

## 2018-06-22 MED ORDER — M.V.I. ADULT IV INJ
Freq: Once | INTRAVENOUS | Status: DC
  Filled 2018-06-22: qty 10

## 2018-06-22 MED ORDER — PRENATAL MULTIVITAMIN CH
1.0000 | ORAL_TABLET | Freq: Every day | ORAL | Status: DC
  Administered 2018-06-23: 1 via ORAL
  Filled 2018-06-22: qty 1

## 2018-06-22 MED ORDER — DOCUSATE SODIUM 100 MG PO CAPS: 100.0000 mg | ORAL_CAPSULE | Freq: Every day | ORAL | Status: DC

## 2018-06-22 MED ORDER — M.V.I. ADULT IV INJ
Freq: Once | INTRAVENOUS | Status: AC
  Administered 2018-06-23: via INTRAVENOUS
  Filled 2018-06-22: qty 1000

## 2018-06-22 MED ORDER — PROMETHAZINE HCL 25 MG PO TABS
25.0000 mg | ORAL_TABLET | Freq: Four times a day (QID) | ORAL | Status: DC | PRN
  Filled 2018-06-22: qty 1

## 2018-06-22 MED ORDER — PROMETHAZINE HCL 25 MG/ML IJ SOLN: 12.5000 mg | Freq: Four times a day (QID) | INTRAMUSCULAR | Status: DC | PRN

## 2018-06-22 MED ORDER — ZOLPIDEM TARTRATE 5 MG PO TABS: 5.0000 mg | ORAL_TABLET | Freq: Every evening | ORAL | Status: DC | PRN

## 2018-06-22 MED ORDER — ACETAMINOPHEN 325 MG PO TABS: 650.0000 mg | ORAL_TABLET | ORAL | Status: DC | PRN

## 2018-06-22 MED ORDER — CALCIUM CARBONATE ANTACID 500 MG PO CHEW: 2.0000 | CHEWABLE_TABLET | ORAL | Status: DC | PRN

## 2018-06-22 MED ORDER — PROMETHAZINE HCL 25 MG/ML IJ SOLN
25.0000 mg | Freq: Once | INTRAVENOUS | Status: DC
  Filled 2018-06-22: qty 1

## 2018-06-22 MED ORDER — PROMETHAZINE HCL 25 MG/ML IJ SOLN
25.0000 mg | Freq: Once | INTRAVENOUS | Status: AC
  Administered 2018-06-22: 25 mg via INTRAVENOUS
  Filled 2018-06-22: qty 1

## 2018-06-22 NOTE — H&P (Signed)
History   CSN: [id number]  Arrival date and time: 06/22/18 1717   First Provider Initiated Contact with Patient 06/22/18 1937         Chief Complaint  Patient presents with  . Emesis  . Abdominal Pain   Elizabeth Fisher is a 28 y.o. G2P0010 at [redacted]w[redacted]d presenting with severe nausea and vomiting of pregnancy. This is an ongoing issue for her. She's been prescribed ondansetron which works intermittently and makes her constipated, and Reglan which gives her pytalysis. She'd prefer not to have to take phenergan because of how sleepy it makes her. She is very frustrated at how sick she's been, how much weight she's lost (12lbs in two weeks), and that she hasn't been able to work. She is not currently vomiting because she took 8mg  of ondansetron prior to coming in. She denies cramping and vaginal bleeding.       Past Medical History:  Diagnosis Date  . Exercise-induced asthma   . Sickle cell trait (HCC)   . 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 Vomiting, unspecified          Past Surgical History:  Procedure Laterality Date  . UPPER GI ENDOSCOPY    . WISDOM TOOTH EXTRACTION      No family history on file.  Social History        Tobacco Use  . Smoking status: Never Smoker  . Smokeless tobacco: Never Used  Substance Use Topics  . Alcohol use: Not Currently  . Drug use: Not Currently    Types: Marijuana    Allergies: No Active Allergies         Medications Prior to Admission  Medication Sig Dispense Refill Last Dose  . amoxicillin (AMOXIL) 500 MG capsule Take 1 capsule (500 mg total) by mouth 3 (three) times daily. (Patient not taking: Reported on 06/18/2018) 21 capsule 0 Not Taking at Unknown time  . metoCLOPramide (REGLAN) 10 MG tablet Take 1 tablet (10 mg total) by mouth every 6 (six) hours. 30 tablet 0   . ondansetron (ZOFRAN ODT) 4 MG disintegrating tablet Take 1 tablet (4 mg total) by mouth every 8 (eight) hours as needed for nausea or vomiting.  20 tablet 0 Past Week at Unknown time  . polyvinyl alcohol (LIQUIFILM TEARS) 1.4 % ophthalmic solution Place 1 drop into both eyes as needed for dry eyes.   Past Week at Unknown time  . promethazine (PHENERGAN) 25 MG tablet Take 25 mg by mouth every 6 (six) hours as needed for nausea or vomiting.   06/17/2018 at Unknown time    Review of Systems  Constitutional: Negative.  Negative for fever.  HENT: Negative.   Eyes: Negative.   Respiratory: Positive for choking (in her sleep if she takes reglan).   Cardiovascular: Negative.   Gastrointestinal: Positive for constipation, nausea and vomiting.  Endocrine: Negative.   Genitourinary: Negative.  Negative for pelvic pain, vaginal bleeding and vaginal discharge.  Musculoskeletal: Negative.   Skin: Negative.   Allergic/Immunologic: Negative.   Neurological: Negative.   Hematological: Negative.   Psychiatric/Behavioral: Negative.    Physical Exam   Blood pressure 132/87, pulse 89, temperature 97.9 F (36.6 C), temperature source Oral, resp. rate 16, weight 44.5 kg, last menstrual period 04/18/2018, unknown if currently breastfeeding.  Physical Exam  Nursing note and vitals reviewed. Constitutional: She is oriented to person, place, and time. Vital signs are normal. She has a sickly appearance. Ill appearance: very thin, skin dry.  HENT:  Head: Normocephalic.  Eyes:  Pupils are equal, round, and reactive to light.  Neck: Normal range of motion.  Cardiovascular: Normal rate.  Respiratory: Effort normal. No respiratory distress.  GI: Soft. She exhibits no distension.  Musculoskeletal: Normal range of motion.  Neurological: She is alert and oriented to person, place, and time.  Skin: Skin is warm and dry. She is not diaphoretic.  Psychiatric: She has a normal mood and affect. Her behavior is normal. Judgment and thought content normal.    MAU Course  Procedures  MDM UA Consulted with Dr. Emelda Fear at 2000 - recommended admit  for overnight hydration, phenergan therapy, and evaluation for steroid taper in the morning.  Results for orders placed or performed during the hospital encounter of 06/22/18 (from the past 24 hour(s))  Urinalysis, Routine w reflex microscopic     Status: Abnormal   Collection Time: 06/22/18  5:42 PM  Result Value Ref Range   Color, Urine YELLOW YELLOW   APPearance CLEAR CLEAR   Specific Gravity, Urine 1.025 1.005 - 1.030   pH 6.0 5.0 - 8.0   Glucose, UA NEGATIVE NEGATIVE mg/dL   Hgb urine dipstick TRACE (A) NEGATIVE   Bilirubin Urine SMALL (A) NEGATIVE   Ketones, ur 40 (A) NEGATIVE mg/dL   Protein, ur NEGATIVE NEGATIVE mg/dL   Nitrite NEGATIVE NEGATIVE   Leukocytes,Ua NEGATIVE NEGATIVE  Urinalysis, Microscopic (reflex)     Status: Abnormal   Collection Time: 06/22/18  5:42 PM  Result Value Ref Range   RBC / HPF 0-5 0 - 5 RBC/hpf   WBC, UA 0-5 0 - 5 WBC/hpf   Bacteria, UA FEW (A) NONE SEEN   Squamous Epithelial / LPF 6-10 0 - 5   Mucus PRESENT     Assessment and Plan  Hyperemesis of pregnancy - admit to high-risk OB  Bernerd Limbo, SNM 06/22/2018, 8:07 PM    I confirm that I have verified the information documented in the nurse midwife student's note and that I have also personally reperformed the history, physical exam and all medical decision making activities of this service and have verified that all service and findings are accurately documented in this student's note.  Raelyn Mora, CNM 06/22/2018 8:45 PM

## 2018-06-22 NOTE — MAU Note (Signed)
Pt C/O vomiting for the past 2 weeks, was prescribed meds in ER but they are no longer working.  States she took her mom's medication today which has helped, doesn't know name of med.  Having some epigastric pain, denies vaginal bleeding.

## 2018-06-22 NOTE — MAU Provider Note (Addendum)
History     CSN: [id number]  Arrival date and time: 06/22/18 1717   First Provider Initiated Contact with Patient 06/22/18 1937      Chief Complaint  Patient presents with  . Emesis  . Abdominal Pain   Elizabeth Fisher is a 28 y.o. G2P0010 at [redacted]w[redacted]d presenting with severe nausea and vomiting of pregnancy. This is an ongoing issue for her. She's been prescribed ondansetron which works intermittently and makes her constipated, and Reglan which gives her pytalysis. She'd prefer not to have to take phenergan because of how sleepy it makes her. She is very frustrated at how sick she's been, how much weight she's lost (12lbs in two weeks), and that she hasn't been able to work. She is not currently vomiting because she took 8mg  of ondansetron prior to coming in. She denies cramping and vaginal bleeding.   Past Medical History:  Diagnosis Date  . Exercise-induced asthma   . Sickle cell trait (HCC)   . 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 Vomiting, unspecified     Past Surgical History:  Procedure Laterality Date  . UPPER GI ENDOSCOPY    . WISDOM TOOTH EXTRACTION      No family history on file.  Social History   Tobacco Use  . Smoking status: Never Smoker  . Smokeless tobacco: Never Used  Substance Use Topics  . Alcohol use: Not Currently  . Drug use: Not Currently    Types: Marijuana    Allergies: No Active Allergies  Medications Prior to Admission  Medication Sig Dispense Refill Last Dose  . amoxicillin (AMOXIL) 500 MG capsule Take 1 capsule (500 mg total) by mouth 3 (three) times daily. (Patient not taking: Reported on 06/18/2018) 21 capsule 0 Not Taking at Unknown time  . metoCLOPramide (REGLAN) 10 MG tablet Take 1 tablet (10 mg total) by mouth every 6 (six) hours. 30 tablet 0   . ondansetron (ZOFRAN ODT) 4 MG disintegrating tablet Take 1 tablet (4 mg total) by mouth every 8 (eight) hours as needed for nausea or vomiting. 20 tablet 0 Past Week at Unknown time  .  polyvinyl alcohol (LIQUIFILM TEARS) 1.4 % ophthalmic solution Place 1 drop into both eyes as needed for dry eyes.   Past Week at Unknown time  . promethazine (PHENERGAN) 25 MG tablet Take 25 mg by mouth every 6 (six) hours as needed for nausea or vomiting.   06/17/2018 at Unknown time    Review of Systems  Constitutional: Negative.  Negative for fever.  HENT: Negative.   Eyes: Negative.   Respiratory: Positive for choking (in her sleep if she takes reglan).   Cardiovascular: Negative.   Gastrointestinal: Positive for constipation, nausea and vomiting.  Endocrine: Negative.   Genitourinary: Negative.  Negative for pelvic pain, vaginal bleeding and vaginal discharge.  Musculoskeletal: Negative.   Skin: Negative.   Allergic/Immunologic: Negative.   Neurological: Negative.   Hematological: Negative.   Psychiatric/Behavioral: Negative.    Physical Exam   Blood pressure 132/87, pulse 89, temperature 97.9 F (36.6 C), temperature source Oral, resp. rate 16, weight 44.5 kg, last menstrual period 04/18/2018, unknown if currently breastfeeding.  Physical Exam  Nursing note and vitals reviewed. Constitutional: She is oriented to person, place, and time. Vital signs are normal. She has a sickly appearance. Ill appearance: very thin, skin dry.  HENT:  Head: Normocephalic.  Eyes: Pupils are equal, round, and reactive to light.  Neck: Normal range of motion.  Cardiovascular: Normal rate.  Respiratory: Effort normal.  No respiratory distress.  GI: Soft. She exhibits no distension.  Musculoskeletal: Normal range of motion.  Neurological: She is alert and oriented to person, place, and time.  Skin: Skin is warm and dry. She is not diaphoretic.  Psychiatric: She has a normal mood and affect. Her behavior is normal. Judgment and thought content normal.    MAU Course  Procedures  MDM UA Consulted with MD at 2000 - recommended admit for overnight hydration, phenergan therapy, and evaluation for  steroid taper in the morning.  Assessment and Plan  Hyperemesis of pregnancy - admit to high-risk OB  Bernerd Limbo, SNM 06/22/2018, 8:07 PM   I confirm that I have verified the information documented in the nurse midwife student's note and that I have also personally reperformed the history, physical exam and all medical decision making activities of this service and have verified that all service and findings are accurately documented in this student's note.   Raelyn Mora, CNM 06/22/2018 8:45 PM

## 2018-06-23 ENCOUNTER — Inpatient Hospital Stay (HOSPITAL_COMMUNITY): Payer: Medicaid Other

## 2018-06-23 LAB — HEPATITIS B SURFACE ANTIGEN: Hepatitis B Surface Ag: NEGATIVE

## 2018-06-23 LAB — CBC
HEMATOCRIT: 33.6 % — AB (ref 36.0–46.0)
Hemoglobin: 11.7 g/dL — ABNORMAL LOW (ref 12.0–15.0)
MCH: 28.3 pg (ref 26.0–34.0)
MCHC: 34.8 g/dL (ref 30.0–36.0)
MCV: 81.4 fL (ref 80.0–100.0)
Platelets: 195 10*3/uL (ref 150–400)
RBC: 4.13 MIL/uL (ref 3.87–5.11)
RDW: 12.3 % (ref 11.5–15.5)
WBC: 7.2 10*3/uL (ref 4.0–10.5)
nRBC: 0 % (ref 0.0–0.2)

## 2018-06-23 LAB — DIFFERENTIAL
Basophils Absolute: 0 10*3/uL (ref 0.0–0.1)
Basophils Relative: 0 %
Eosinophils Absolute: 0.1 10*3/uL (ref 0.0–0.5)
Eosinophils Relative: 1 %
Lymphocytes Relative: 18 %
Lymphs Abs: 1.3 10*3/uL (ref 0.7–4.0)
Monocytes Absolute: 0.5 10*3/uL (ref 0.1–1.0)
Monocytes Relative: 7 %
Neutro Abs: 5.3 10*3/uL (ref 1.7–7.7)
Neutrophils Relative %: 74 %

## 2018-06-23 LAB — ABO/RH: ABO/RH(D): AB POS

## 2018-06-23 LAB — HIV ANTIBODY (ROUTINE TESTING W REFLEX): HIV Screen 4th Generation wRfx: NONREACTIVE

## 2018-06-23 LAB — RPR: RPR Ser Ql: NONREACTIVE

## 2018-06-23 MED ORDER — SCOPOLAMINE 1 MG/3DAYS TD PT72
1.0000 | MEDICATED_PATCH | TRANSDERMAL | Status: DC
  Administered 2018-06-23: 1.5 mg via TRANSDERMAL
  Filled 2018-06-23: qty 1

## 2018-06-23 MED ORDER — PROMETHAZINE HCL 25 MG PO TABS
25.0000 mg | ORAL_TABLET | Freq: Four times a day (QID) | ORAL | Status: DC
  Administered 2018-06-23 – 2018-06-24 (×5): 25 mg via ORAL
  Filled 2018-06-23 (×9): qty 1

## 2018-06-23 MED ORDER — POLYETHYLENE GLYCOL 3350 17 G PO PACK
17.0000 g | PACK | Freq: Every day | ORAL | Status: DC
  Administered 2018-06-23: 17 g via ORAL
  Filled 2018-06-23 (×2): qty 1

## 2018-06-23 MED ORDER — SODIUM CHLORIDE 0.9 % IV SOLN
INTRAVENOUS | Status: DC
  Administered 2018-06-23 – 2018-06-24 (×4): via INTRAVENOUS

## 2018-06-23 MED ORDER — PANTOPRAZOLE SODIUM 40 MG PO TBEC
40.0000 mg | DELAYED_RELEASE_TABLET | Freq: Every day | ORAL | Status: DC
  Administered 2018-06-23 – 2018-06-24 (×2): 40 mg via ORAL
  Filled 2018-06-23 (×2): qty 1

## 2018-06-23 MED ORDER — METOCLOPRAMIDE HCL 5 MG/ML IJ SOLN
10.0000 mg | Freq: Four times a day (QID) | INTRAMUSCULAR | Status: DC | PRN
  Administered 2018-06-24 (×2): 10 mg via INTRAVENOUS
  Filled 2018-06-23 (×2): qty 2

## 2018-06-23 NOTE — Progress Notes (Signed)
FACULTY PRACTICE ANTEPARTUM(COMPREHENSIVE) NOTE  Elizabeth Fisher is a 28 y.o. G2P0010 at [redacted]w[redacted]d by LMP who is admitted for Nausea and vomiting.   Fetal presentation is unknown Length of Stay:  1  Days  Subjective: Pt did Well on Phenergan, which she was reluctant to take due to the sedation side effect. Pt asked to use smaller doses, 12.5 mg , more often, if needed. Will give pt suppositories as well as tablets at d/c Pt is taking PO jello and liquids without vomiting,  And has voided several times, so will advance diet to low residue diet and if tolerated, may d/c midday. Patient reports the fetal movement as n/a. Patient reports uterine contraction  activity as none. Patient reports  vaginal bleeding as none. Patient describes fluid per vagina as None.  Vitals:  Blood pressure 120/83, pulse 68, temperature 98.4 F (36.9 C), temperature source Oral, resp. rate 15, height 5\' 6"  (1.676 m), weight 43.5 kg, last menstrual period 04/18/2018, SpO2 100 %, unknown if currently breastfeeding. Physical Examination:  General appearance - alert, well appearing, and in no distress Heart - normal rate and regular rhythm Abdomen - soft, nontender, nondistended, bowel sounds active peristalsis Extremities: extremities normal, atraumatic, no cyanosis or edema and Homans sign is negative, no sign of DVT with DTRs 2+ bilaterally Membranes:intact  Fetal Monitoring:  Will obtain initial dating u/s this a.m  Labs:  Results for orders placed or performed during the hospital encounter of 06/22/18 (from the past 24 hour(s))  Urinalysis, Routine w reflex microscopic   Collection Time: 06/22/18  5:42 PM  Result Value Ref Range   Color, Urine YELLOW YELLOW   APPearance CLEAR CLEAR   Specific Gravity, Urine 1.025 1.005 - 1.030   pH 6.0 5.0 - 8.0   Glucose, UA NEGATIVE NEGATIVE mg/dL   Hgb urine dipstick TRACE (A) NEGATIVE   Bilirubin Urine SMALL (A) NEGATIVE   Ketones, ur 40 (A) NEGATIVE mg/dL   Protein, ur  NEGATIVE NEGATIVE mg/dL   Nitrite NEGATIVE NEGATIVE   Leukocytes,Ua NEGATIVE NEGATIVE  Urinalysis, Microscopic (reflex)   Collection Time: 06/22/18  5:42 PM  Result Value Ref Range   RBC / HPF 0-5 0 - 5 RBC/hpf   WBC, UA 0-5 0 - 5 WBC/hpf   Bacteria, UA FEW (A) NONE SEEN   Squamous Epithelial / LPF 6-10 0 - 5   Mucus PRESENT   Type and screen [facility]   Collection Time: 06/22/18  8:37 PM  Result Value Ref Range   ABO/RH(D) AB POS    Antibody Screen NEG    Sample Expiration      06/25/2018 Performed at [facility], [address] [place], [place] [address]   ABO/Rh   Collection Time: 06/22/18  8:37 PM  Result Value Ref Range   ABO/RH(D)      AB POS Performed at [facility], [address], [place], [place] [address]     Imaging Studies:     U/S ordered for this a.m.  Medications:  Scheduled . docusate sodium  100 mg Oral Daily  . prenatal multivitamin  1 tablet Oral Q1200   I have reviewed the patient's current medications.  ASSESSMENT: Patient Active Problem List   Diagnosis Date Noted  . Hyperemesis gravidarum before end of [redacted] week gestation with carbohydrate depletion 06/22/2018  . Subglottic abscess 08/21/2014  history of abscess, not active problem  PLAN: Low residue diet. Pelvic u/s this a.m. Discharge by midday on PO and PR phenergan  Elizabeth Fisher 06/23/2018,6:12 AM    Patient ID: Elizabeth Fisher, female   DOB: 29-Jul-1990, 28 y.o.   MRN: [id number]

## 2018-06-23 NOTE — Progress Notes (Signed)
OB note Did okay with lunch and able to take some PO with some nausea w/o vomiting. Pt states she's feeling better. Was using zofran at home (caused constipation) and reglan (didn't like b/c it made her mouth water). I told her that if she's feeling well after dinner then I'm fine with her going home tonight or can watch overnight and if continues to improve can go home tomorrow. Pt amenable to plan  Last bm two days ago. Will add miralax.   Cornelia Copa MD Attending Center for [employer] (Faculty Practice) 06/23/2018 Time: [phone]

## 2018-06-24 DIAGNOSIS — O139 Gestational [pregnancy-induced] hypertension without significant proteinuria, unspecified trimester: Secondary | ICD-10-CM | POA: Diagnosis present

## 2018-06-24 LAB — RUBELLA SCREEN: Rubella: 3.95 index (ref >0.99)

## 2018-06-24 MED ORDER — SCOPOLAMINE 1 MG/3DAYS TD PT72: 1.0000 | MEDICATED_PATCH | TRANSDERMAL | 0 refills | Status: AC

## 2018-06-24 MED ORDER — PROMETHAZINE HCL 25 MG PO TABS: 25.0000 mg | ORAL_TABLET | Freq: Four times a day (QID) | ORAL | 2 refills | Status: DC

## 2018-06-24 MED ORDER — ONDANSETRON HCL 4 MG/2ML IJ SOLN
4.0000 mg | Freq: Four times a day (QID) | INTRAMUSCULAR | Status: DC | PRN
  Administered 2018-06-24: 4 mg via INTRAVENOUS
  Filled 2018-06-24: qty 2

## 2018-06-24 MED ORDER — ACETAMINOPHEN 325 MG PO TABS: 650.0000 mg | ORAL_TABLET | ORAL | Status: DC | PRN

## 2018-06-24 MED ORDER — ONDANSETRON 4 MG PO TBDP: 4.0000 mg | ORAL_TABLET | Freq: Four times a day (QID) | ORAL | 2 refills | Status: DC | PRN

## 2018-06-24 MED ORDER — ENSURE ENLIVE PO LIQD
237.0000 mL | Freq: Two times a day (BID) | ORAL | Status: DC
  Administered 2018-06-24 (×2): 237 mL via ORAL
  Filled 2018-06-24 (×2): qty 237

## 2018-06-24 MED ORDER — POLYETHYLENE GLYCOL 3350 17 G PO PACK: 17.0000 g | PACK | Freq: Every day | ORAL | 1 refills | Status: DC

## 2018-06-24 MED ORDER — PRENATAL MULTIVITAMIN CH: 1.0000 | ORAL_TABLET | Freq: Every day | ORAL | 3 refills | Status: DC

## 2018-06-24 MED ORDER — ENSURE ENLIVE PO LIQD: 237.0000 mL | Freq: Two times a day (BID) | ORAL | 1 refills | Status: AC

## 2018-06-24 MED ORDER — PANTOPRAZOLE SODIUM 40 MG PO TBEC: 40.0000 mg | DELAYED_RELEASE_TABLET | Freq: Every day | ORAL | 7 refills | Status: DC

## 2018-06-24 MED ORDER — ONDANSETRON 4 MG PO TBDP
4.0000 mg | ORAL_TABLET | Freq: Four times a day (QID) | ORAL | Status: DC | PRN
  Filled 2018-06-24: qty 1

## 2018-06-24 NOTE — Progress Notes (Signed)
Patient was able to tolerate food intake and desires discharge to home. She was discharged to home in stable condition, Zofran prescribed for patient.  Jaynie Collins, MD, FACOG Obstetrician & Gynecologist, [facility] for [employer], [facility]

## 2018-06-24 NOTE — Discharge Summary (Signed)
Antenatal Physician Discharge Summary  Patient ID: Elizabeth Fisher MRN: [id number] DOB/AGE: 28/05/1990 27 y.o.  Admit date: 06/22/2018 Discharge date: 06/24/2018  Admission Diagnoses: Principal Problem:   Hyperemesis gravidarum with metabolic disturbance, antepartum Active Problems:   Transient hypertension of pregnancy  Discharge Diagnoses: The same  Prenatal Procedures: ultrasound  Consults: None  Hospital Course:  This is a 28 y.o. G2P0010 with IUP at 1969w4d admitted for hyperemesis gravidarum (HEG) and recent 12 pound weight loss.  She was treated with various medications (Reglan, Phenergan, Zofran, Scopolamine and Protonix) and adequate IV hydration. She was initially not able to tolerate much food but was tolerating regular diet by time of discharge.  She had some constipation due to ZOoran, treated with Miralax.  She had no other maternal-fetal concerns.  On HD3, she was deemed stable for discharge to home with outpatient follow up.  Discharge Exam: Temp:  [98.1 F (36.7 C)-99 F (37.2 C)] 99 F (37.2 C) (02/16 1155) Pulse Rate:  [73-89] 85 (02/16 1155) Resp:  [15-18] 15 (02/16 1155) BP: (106-137)/(64-96) 114/73 (02/16 1155) SpO2:  [99 %-100 %] 99 % (02/16 1155) Physical Examination: CONSTITUTIONAL: Well-developed, well-nourished female in no acute distress.  HENT:  Normocephalic, atraumatic, External right and left ear normal. Oropharynx is clear and moist EYES: Conjunctivae and EOM are normal. Pupils are equal, round, and reactive to light. No scleral icterus.  NECK: Normal range of motion, supple, no masses SKIN: Skin is warm and dry. No rash noted. Not diaphoretic. No erythema. No pallor. NEUROLGIC: Alert and oriented to person, place, and time. Normal reflexes, muscle tone coordination. No cranial nerve deficit noted. PSYCHIATRIC: Normal mood and affect. Normal behavior. Normal judgment and thought content. CARDIOVASCULAR: Normal heart rate noted, regular  rhythm RESPIRATORY: Effort and breath sounds normal, no problems with respiration noted MUSCULOSKELETAL: Normal range of motion. No edema and no tenderness. 2+ distal pulses. ABDOMEN: Soft, nontender, nondistended CERVIX:   Deferred  Significant Diagnostic Studies:  Results for orders placed or performed during the hospital encounter of 06/22/18 (from the past 168 hour(s))  Urinalysis, Routine w reflex microscopic   Collection Time: 06/22/18  5:42 PM  Result Value Ref Range   Color, Urine YELLOW YELLOW   APPearance CLEAR CLEAR   Specific Gravity, Urine 1.025 1.005 - 1.030   pH 6.0 5.0 - 8.0   Glucose, UA NEGATIVE NEGATIVE mg/dL   Hgb urine dipstick TRACE (A) NEGATIVE   Bilirubin Urine SMALL (A) NEGATIVE   Ketones, ur 40 (A) NEGATIVE mg/dL   Protein, ur NEGATIVE NEGATIVE mg/dL   Nitrite NEGATIVE NEGATIVE   Leukocytes,Ua NEGATIVE NEGATIVE  Urinalysis, Microscopic (reflex)   Collection Time: 06/22/18  5:42 PM  Result Value Ref Range   RBC / HPF 0-5 0 - 5 RBC/hpf   WBC, UA 0-5 0 - 5 WBC/hpf   Bacteria, UA FEW (A) NONE SEEN   Squamous Epithelial / LPF 6-10 0 - 5   Mucus PRESENT   Type and screen [facility]   Collection Time: 06/22/18  8:37 PM  Result Value Ref Range   ABO/RH(D) AB POS    Antibody Screen NEG    Sample Expiration      06/25/2018 Performed at [facility], [address] [place], [place] [address]   ABO/Rh   Collection Time: 06/22/18  8:37 PM  Result Value Ref Range   ABO/RH(D)      AB POS Performed at [facility], [address] [place], [place] [address]   Hepatitis  B surface antigen   Collection Time: 06/23/18  7:12 AM  Result Value Ref Range   Hepatitis B Surface Ag Negative Negative  Rubella screen   Collection Time: 06/23/18  7:12 AM  Result Value Ref Range   Rubella 3.95 Immune >0.99 index  RPR   Collection Time: 06/23/18  7:12 AM  Result Value Ref Range   RPR Ser Ql Non Reactive Non Reactive  CBC   Collection  Time: 06/23/18  7:12 AM  Result Value Ref Range   WBC 7.2 4.0 - 10.5 K/uL   RBC 4.13 3.87 - 5.11 MIL/uL   Hemoglobin 11.7 (L) 12.0 - 15.0 g/dL   HCT 97.6 (L) 73.4 - 19.3 %   MCV 81.4 80.0 - 100.0 fL   MCH 28.3 26.0 - 34.0 pg   MCHC 34.8 30.0 - 36.0 g/dL   RDW 79.0 24.0 - 97.3 %   Platelets 195 150 - 400 K/uL   nRBC 0.0 0.0 - 0.2 %  Differential   Collection Time: 06/23/18  7:12 AM  Result Value Ref Range   Neutrophils Relative % 74 %   Neutro Abs 5.3 1.7 - 7.7 K/uL   Lymphocytes Relative 18 %   Lymphs Abs 1.3 0.7 - 4.0 K/uL   Monocytes Relative 7 %   Monocytes Absolute 0.5 0.1 - 1.0 K/uL   Eosinophils Relative 1 %   Eosinophils Absolute 0.1 0.0 - 0.5 K/uL   Basophils Relative 0 %   Basophils Absolute 0.0 0.0 - 0.1 K/uL  HIV antibody (routine testing)   Collection Time: 06/23/18  7:12 AM  Result Value Ref Range   HIV Screen 4th Generation wRfx Non Reactive Non Reactive  Results for orders placed or performed during the hospital encounter of 06/18/18 (from the past 168 hour(s))  Comprehensive metabolic panel   Collection Time: 06/18/18  3:24 PM  Result Value Ref Range   Sodium 135 135 - 145 mmol/L   Potassium 3.9 3.5 - 5.1 mmol/L   Chloride 101 98 - 111 mmol/L   CO2 22 22 - 32 mmol/L   Glucose, Bld 86 70 - 99 mg/dL   BUN 17 6 - 20 mg/dL   Creatinine, Ser 5.32 0.44 - 1.00 mg/dL   Calcium 9.7 8.9 - 99.2 mg/dL   Total Protein 8.7 (H) 6.5 - 8.1 g/dL   Albumin 4.9 3.5 - 5.0 g/dL   AST 19 15 - 41 U/L   ALT 15 0 - 44 U/L   Alkaline Phosphatase 48 38 - 126 U/L   Total Bilirubin 0.5 0.3 - 1.2 mg/dL   GFR calc non Af Amer >60 >60 mL/min   GFR calc Af Amer >60 >60 mL/min   Anion gap 12 5 - 15  CBC with Differential   Collection Time: 06/18/18  3:24 PM  Result Value Ref Range   WBC 8.4 4.0 - 10.5 K/uL   RBC 5.32 (H) 3.87 - 5.11 MIL/uL   Hemoglobin 14.5 12.0 - 15.0 g/dL   HCT 42.6 83.4 - 19.6 %   MCV 82.5 80.0 - 100.0 fL   MCH 27.3 26.0 - 34.0 pg   MCHC 33.0 30.0 - 36.0  g/dL   RDW 22.2 97.9 - 89.2 %   Platelets 254 150 - 400 K/uL   nRBC 0.0 0.0 - 0.2 %   Neutrophils Relative % 86 %   Neutro Abs 7.2 1.7 - 7.7 K/uL   Lymphocytes Relative 7 %   Lymphs Abs 0.6 (L) 0.7 - 4.0 K/uL  Monocytes Relative 7 %   Monocytes Absolute 0.6 0.1 - 1.0 K/uL   Eosinophils Relative 0 %   Eosinophils Absolute 0.0 0.0 - 0.5 K/uL   Basophils Relative 0 %   Basophils Absolute 0.0 0.0 - 0.1 K/uL   Immature Granulocytes 0 %   Abs Immature Granulocytes 0.02 0.00 - 0.07 K/uL  Urinalysis, Routine w reflex microscopic   Collection Time: 06/18/18  3:24 PM  Result Value Ref Range   Color, Urine YELLOW YELLOW   APPearance HAZY (A) CLEAR   Specific Gravity, Urine 1.031 (H) 1.005 - 1.030   pH 5.0 5.0 - 8.0   Glucose, UA 50 (A) NEGATIVE mg/dL   Hgb urine dipstick NEGATIVE NEGATIVE   Bilirubin Urine NEGATIVE NEGATIVE   Ketones, ur 80 (A) NEGATIVE mg/dL   Protein, ur 30 (A) NEGATIVE mg/dL   Nitrite NEGATIVE NEGATIVE   Leukocytes, UA NEGATIVE NEGATIVE   RBC / HPF 0-5 0 - 5 RBC/hpf   WBC, UA 0-5 0 - 5 WBC/hpf   Bacteria, UA RARE (A) NONE SEEN   Squamous Epithelial / LPF 0-5 0 - 5   Mucus PRESENT   I-Stat beta hCG blood, ED   Collection Time: 06/18/18  5:03 PM  Result Value Ref Range   I-stat hCG, quantitative >2,000.0 (H) <5 mIU/mL   Comment 3           US Ob Less Than 14 Weeks With Ob Transvaginal  Result Date: 06/23/2018 CLINICAL DATA:  Hyperemesis gravidarum.  LMP 04/18/2018. EXAM: OBSTETRIC <14 WK [place] AND TRANSVAGINAL OB US TECHNIQUE: Both transabdominal and transvaginal ultrasound examinations were performed for complete evaluation of the gestation as well as the maternal uterus, adnexal regions, and pelvic cul-de-sac. Transvaginal technique was performed to assess early pregnancy. COMPARISON:  Obstetric ultrasound 01/10/2018. FINDINGS: Intrauterine gestational sac: Single Yolk sac:  Present Embryo:  Present Cardiac Activity: Present Heart Rate: 176 bpm CRL: 24 mm   9 w   0 d                   [place] EDC: 01/26/2019 Subchorionic hemorrhage: Small subchorionic hematoma. Unfused amnion noted. Maternal uterus/adnexae: Both maternal ovaries are visualized and appear normal. No adnexal mass or free pelvic fluid. IMPRESSION: 1. Single live intrauterine pregnancy with best estimated gestational age of [redacted] weeks 0 days. 2. Small subchorionic hematoma. Electronically Signed   By: Carey Bullocks M.D.   On: 06/23/2018 11:43    No future appointments.  Discharge Condition: Stable  Discharge disposition: 01-Home or Self Care       Discharge Instructions    Discharge patient   Complete by:  As directed    Discharge disposition:  01-Home or Self Care   Discharge patient date:  06/24/2018     Allergies as of 06/24/2018   No Known Allergies     Medication List    STOP taking these medications   amoxicillin 500 MG capsule Commonly known as:  AMOXIL   metoCLOPramide 10 MG tablet Commonly known as:  REGLAN     TAKE these medications   acetaminophen 325 MG tablet Commonly known as:  TYLENOL Take 2 tablets (650 mg total) by mouth every 4 (four) hours as needed (for pain scale < 4  OR  temperature  >/=  100.5 F).   feeding supplement (ENSURE ENLIVE) Liqd Take 237 mLs by mouth 2 (two) times daily between meals for 14 days.   ondansetron 4 MG disintegrating tablet Commonly known as:  ZOFRAN ODT  Take 1 tablet (4 mg total) by mouth every 6 (six) hours as needed for nausea or vomiting. What changed:  when to take this   pantoprazole 40 MG tablet Commonly known as:  PROTONIX Take 1 tablet (40 mg total) by mouth daily for 30 days.   polyethylene glycol packet Commonly known as:  MIRALAX / GLYCOLAX Take 17 g by mouth daily for 30 days.   prenatal multivitamin Tabs tablet Take 1 tablet by mouth daily at 12 noon.   promethazine 25 MG tablet Commonly known as:  PHENERGAN Take 1 tablet (25 mg total) by mouth every 6 (six) hours for 14 days.   scopolamine 1  MG/3DAYS Commonly known as:  TRANSDERM-SCOP Place 1 patch (1.5 mg total) onto the skin every 3 (three) days for 21 days. Start taking on:  June 26, 2018      Follow-up Information    Center for [facility]. Schedule an appointment as soon as possible for a visit in 1 week(s).   Specialty:  Obstetrics and Gynecology Why:  call clinic for appointment if they haven't called you by tuesday Contact information: [address] [place] [address] [phone]          Signed: Jaynie Collins M.D. 06/24/2018, 1:46 PM

## 2018-06-24 NOTE — Progress Notes (Signed)
States nausea relieved after ondansetron administration. States she was able to drink Ensure and eat a banana and kept both down without nausea or vomiting.

## 2018-06-24 NOTE — Discharge Instructions (Signed)
Hyperemesis Gravidarum  Hyperemesis gravidarum is a severe form of nausea and vomiting that happens during pregnancy. Hyperemesis is worse than morning sickness. It may cause you to have nausea or vomiting all day for many days. It may keep you from eating and drinking enough food and liquids, which can lead to dehydration, malnutrition, and weight loss. Hyperemesis usually occurs during the first half (the first 20 weeks) of pregnancy. It often goes away once a woman is in her second half of pregnancy. However, sometimes hyperemesis continues through an entire pregnancy.  What are the causes?  The cause of this condition is not known. It may be related to changes in chemicals (hormones) in the body during pregnancy, such as the high level of pregnancy hormone (human chorionic gonadotropin) or the increase in the female sex hormone (estrogen).  What are the signs or symptoms?  Symptoms of this condition include:  Nausea that does not go away.  Vomiting that does not allow you to keep any food down.  Weight loss.  Body fluid loss (dehydration).  Having no desire to eat, or not liking food that you have previously enjoyed.  How is this diagnosed?  This condition may be diagnosed based on:  A physical exam.  Your medical history.  Your symptoms.  Blood tests.  Urine tests.  How is this treated?  This condition is managed by controlling symptoms. This may include:  Following an eating plan. This can help lessen nausea and vomiting.  Taking prescription medicines.  An eating plan and medicines are often used together to help control symptoms. If medicines do not help relieve nausea and vomiting, you may need to receive fluids through an IV at the hospital.  Follow these instructions at home:  Eating and drinking    Avoid the following:  Drinking fluids with meals. Try not to drink anything during the 30 minutes before and after your meals.  Drinking more than 1 cup of fluid at a time.  Eating foods that trigger your  symptoms. These may include spicy foods, coffee, high-fat foods, very sweet foods, and acidic foods.  Skipping meals. Nausea can be more intense on an empty stomach. If you cannot tolerate food, do not force it. Try sucking on ice chips or other frozen items and make up for missed calories later.  Lying down within 2 hours after eating.  Being exposed to environmental triggers. These may include food smells, smoky rooms, closed spaces, rooms with strong smells, warm or humid places, overly loud and noisy rooms, and rooms with motion or flickering lights. Try eating meals in a well-ventilated area that is free of strong smells.  Quick and sudden changes in your movement.  Taking iron pills and multivitamins that contain iron. If you take prescription iron pills, do not stop taking them unless your health care provider approves.  Preparing food. The smell of food can spoil your appetite or trigger nausea.  To help relieve your symptoms:  Listen to your body. Everyone is different and has different preferences. Find what works best for you.  Eat and drink slowly.  Eat 5-6 small meals daily instead of 3 large meals. Eating small meals and snacks can help you avoid an empty stomach.  In the morning, before getting out of bed, eat a couple of crackers to avoid moving around on an empty stomach.  Try eating starchy foods as these are usually tolerated well. Examples include cereal, toast, bread, potatoes, pasta, rice, and pretzels.  Include at   least 1 serving of protein with your meals and snacks. Protein options include lean meats, poultry, seafood, beans, nuts, nut butters, eggs, cheese, and yogurt.  Try eating a protein-rich snack before bed. Examples of a protein-rick snack include cheese and crackers or a peanut butter sandwich made with 1 slice of whole-wheat bread and 1 tsp (5 g) of peanut butter.  Eat or suck on things that have ginger in them. It may help relieve nausea. Add  tsp ground ginger to hot tea or  choose ginger tea.  Try drinking 100% fruit juice or an electrolyte drink. An electrolyte drink contains sodium, potassium, and chloride.  Drink fluids that are cold, clear, and carbonated or sour. Examples include lemonade, ginger ale, lemon-lime soda, ice water, and sparkling water.  Brush your teeth or use a mouth rinse after meals.  Talk with your health care provider about starting a supplement of vitamin B6.  General instructions  Take over-the-counter and prescription medicines only as told by your health care provider.  Follow instructions from your health care provider about eating or drinking restrictions.  Continue to take your prenatal vitamins as told by your health care provider. If you are having trouble taking your prenatal vitamins, talk with your health care provider about different options.  Keep all follow-up and pre-birth (prenatal) visits as told by your health care provider. This is important.  Contact a health care provider if:  You have pain in your abdomen.  You have a severe headache.  You have vision problems.  You are losing weight.  You feel weak or dizzy.  Get help right away if:  You cannot drink fluids without vomiting.  You vomit blood.  You have constant nausea and vomiting.  You are very weak.  You faint.  You have a fever and your symptoms suddenly get worse.  Summary  Hyperemesis gravidarum is a severe form of nausea and vomiting that happens during pregnancy.  Making some changes to your eating habits may help relieve nausea and vomiting.  This condition may be managed with medicine.  If medicines do not help relieve nausea and vomiting, you may need to receive fluids through an IV at the hospital.  This information is not intended to replace advice given to you by your health care provider. Make sure you discuss any questions you have with your health care provider.  Document Released: 04/25/2005 Document Revised: 05/15/2017 Document Reviewed: 12/23/2015  [employer]vier Interactive  Patient Education  2019 [employer]

## 2018-06-24 NOTE — Progress Notes (Signed)
[facility] Attending Note  Subjective:  RN called to report that patient had emesis x 3 since she was told she will be going home today. Has taken Reglan and Phenergan, avoiding Zofran due to constipation.    Admitted on 06/22/2018 for Hyperemesis gravidarum with metabolic disturbance, antepartum.    Objective (Exam done by Dr. Vergie Living):  Blood pressure 115/87, pulse 89, temperature 98.8 F (37.1 C), temperature source Oral, resp. rate 16, height 5\' 6"  (1.676 m), weight 43.5 kg, last menstrual period 04/18/2018, SpO2 100 %, unknown if currently breastfeeding. Gen: NAD HENT: Normocephalic, atraumatic Lungs: Normal respiratory effort Heart: Regular rate noted Abdomen: NT, soft Cervix: Deferred Ext: 2+ DTRs, no edema, no cyanosis, negative Homan's sign  Assessment & Plan:  28 y.o. G2P0010 at [redacted]w[redacted]d admitted for HEG - Hold off on discharge for now - Continue current antiemetics, protonix. - If emesis continues, will give Zofran. Can always treat constipation prn - If emesis not controlled on Zofran addition, consider backing off her diet and starting steroid regimen Continue close observation.   Jaynie Collins, MD, FACOG Obstetrician & Gynecologist, [facility] for [employer], [facility]

## 2018-06-24 NOTE — Progress Notes (Signed)
Discharged home, ambulatory, in stable condition. 

## 2018-06-25 ENCOUNTER — Telehealth: Payer: Self-pay | Admitting: *Deleted

## 2018-06-25 DIAGNOSIS — Z3400 Encounter for supervision of normal first pregnancy, unspecified trimester: Secondary | ICD-10-CM

## 2018-06-25 DIAGNOSIS — O219 Vomiting of pregnancy, unspecified: Secondary | ICD-10-CM

## 2018-06-25 MED ORDER — VITAFOL GUMMIES 3.33-0.333-34.8 MG PO CHEW: 3.0000 | CHEWABLE_TABLET | Freq: Every day | ORAL | 12 refills | Status: DC

## 2018-06-25 MED ORDER — DOXYLAMINE-PYRIDOXINE ER 20-20 MG PO TBCR: 20.0000 mg | EXTENDED_RELEASE_TABLET | Freq: Two times a day (BID) | ORAL | 0 refills | Status: DC

## 2018-06-25 NOTE — Telephone Encounter (Signed)
Patient in clinic for Bonjesta 20/20 mg samples. 4 Bottles, 6 tablets per bottles given. Lot #[id number] exp 02/06/2020. Prenatal gummies sent to pharmacy.  Clovis Pu, RN

## 2018-06-25 NOTE — Telephone Encounter (Signed)
-----   Message from Raelyn Mora, [place] sent at 06/22/2018  8:34 PM EST ----- Regarding: Needs Samples Please give this patient's mother some samples of Bonjesta. Her mother's name is Ignacia Felling. She said she would come by on Monday 06/25/2018 to pick them up. This patient is going to make an appointment for OB care at [facility]  ~Ro

## 2018-07-09 ENCOUNTER — Encounter: Payer: Self-pay | Admitting: General Practice

## 2018-07-09 ENCOUNTER — Ambulatory Visit (INDEPENDENT_AMBULATORY_CARE_PROVIDER_SITE_OTHER): Payer: Medicaid Other | Admitting: *Deleted

## 2018-07-09 ENCOUNTER — Other Ambulatory Visit: Payer: Self-pay

## 2018-07-09 DIAGNOSIS — Z3400 Encounter for supervision of normal first pregnancy, unspecified trimester: Secondary | ICD-10-CM

## 2018-07-09 DIAGNOSIS — Z3401 Encounter for supervision of normal first pregnancy, first trimester: Secondary | ICD-10-CM

## 2018-07-09 HISTORY — DX: Encounter for supervision of normal first pregnancy, unspecified trimester: Z34.00

## 2018-07-09 LAB — POCT URINALYSIS DIPSTICK OB
BILIRUBIN UA: NEGATIVE
Blood, UA: NEGATIVE
Glucose, UA: NEGATIVE
Nitrite, UA: NEGATIVE
Spec Grav, UA: 1.025 (ref 1.010–1.025)
Urobilinogen, UA: 0.2 E.U./dL
pH, UA: 6 (ref 5.0–8.0)

## 2018-07-09 NOTE — Patient Instructions (Addendum)
Genetic Screening Results Information: You are having genetic testing called [facility] today.  It will take approximately 2 weeks before the results are available.  To get your results, you need Internet access to a web browser to search [facility]/MyChart (the direct app on your phone will not give you these results).  Then select Lab Scanned and click on the blue hyper link that says View Image to see your [facility] results.  You can also use the directions on the purple card given to look up your results directly on the [facility] website.  First Trimester of Pregnancy  The first trimester of pregnancy is from week 1 until the end of week 13 (months 1 through 3). During this time, your baby will begin to develop inside you. At 6-8 weeks, the eyes and face are formed, and the heartbeat can be seen on ultrasound. At the end of 12 weeks, all the baby's organs are formed. Prenatal care is all the medical care you receive before the birth of your baby. Make sure you get good prenatal care and follow all of your doctor's instructions. Follow these instructions at home: Medicines  Take over-the-counter and prescription medicines only as told by your doctor. Some medicines are safe and some medicines are not safe during pregnancy.  Take a prenatal vitamin that contains at least 600 micrograms (mcg) of folic acid.  If you have trouble pooping (constipation), take medicine that will make your stool soft (stool softener) if your doctor approves. Eating and drinking   Eat regular, healthy meals.  Your doctor will tell you the amount of weight gain that is right for you.  Avoid raw meat and uncooked cheese.  If you feel sick to your stomach (nauseous) or throw up (vomit): ? Eat 4 or 5 small meals a day instead of 3 large meals. ? Try eating a few soda crackers. ? Drink liquids between meals instead of during meals.  To prevent constipation: ? Eat foods that are high in fiber, like fresh fruits and  vegetables, whole grains, and beans. ? Drink enough fluids to keep your pee (urine) clear or pale yellow. Activity  Exercise only as told by your doctor. Stop exercising if you have cramps or pain in your lower belly (abdomen) or low back.  Do not exercise if it is too hot, too humid, or if you are in a place of great height (high altitude).  Try to avoid standing for long periods of time. Move your legs often if you must stand in one place for a long time.  Avoid heavy lifting.  Wear low-heeled shoes. Sit and stand up straight.  You can have sex unless your doctor tells you not to. Relieving pain and discomfort  Wear a good support bra if your breasts are sore.  Take warm water baths (sitz baths) to soothe pain or discomfort caused by hemorrhoids. Use hemorrhoid cream if your doctor says it is okay.  Rest with your legs raised if you have leg cramps or low back pain.  If you have puffy, bulging veins (varicose veins) in your legs: ? Wear support hose or compression stockings as told by your doctor. ? Raise (elevate) your feet for 15 minutes, 3-4 times a day. ? Limit salt in your food. Prenatal care  Schedule your prenatal visits by the twelfth week of pregnancy.  Write down your questions. Take them to your prenatal visits.  Keep all your prenatal visits as told by your doctor. This is important.  Safety  Wear your seat belt at all times when driving.  Make a list of emergency phone numbers. The list should include numbers for family, friends, the hospital, and police and fire departments. General instructions  Ask your doctor for a referral to a local prenatal class. Begin classes no later than at the start of month 6 of your pregnancy.  Ask for help if you need counseling or if you need help with nutrition. Your doctor can give you advice or tell you where to go for help.  Do not use hot tubs, steam rooms, or saunas.  Do not douche or use tampons or scented sanitary  pads.  Do not cross your legs for long periods of time.  Avoid all herbs and alcohol. Avoid drugs that are not approved by your doctor.  Do not use any tobacco products, including cigarettes, chewing tobacco, and electronic cigarettes. If you need help quitting, ask your doctor. You may get counseling or other support to help you quit.  Avoid cat litter boxes and soil used by cats. These carry germs that can cause birth defects in the baby and can cause a loss of your baby (miscarriage) or stillbirth.  Visit your dentist. At home, brush your teeth with a soft toothbrush. Be gentle when you floss. Contact a doctor if:  You are dizzy.  You have mild cramps or pressure in your lower belly.  You have a nagging pain in your belly area.  You continue to feel sick to your stomach, you throw up, or you have watery poop (diarrhea).  You have a bad smelling fluid coming from your vagina.  You have pain when you pee (urinate).  You have increased puffiness (swelling) in your face, hands, legs, or ankles. Get help right away if:  You have a fever.  You are leaking fluid from your vagina.  You have spotting or bleeding from your vagina.  You have very bad belly cramping or pain.  You gain or lose weight rapidly.  You throw up blood. It may look like coffee grounds.  You are around people who have [place] measles, fifth disease, or chickenpox.  You have a very bad headache.  You have shortness of breath.  You have any kind of trauma, such as from a fall or a car accident. Summary  The first trimester of pregnancy is from week 1 until the end of week 13 (months 1 through 3).  To take care of yourself and your unborn baby, you will need to eat healthy meals, take medicines only if your doctor tells you to do so, and do activities that are safe for you and your baby.  Keep all follow-up visits as told by your doctor. This is important as your doctor will have to ensure that your  baby is healthy and growing well. This information is not intended to replace advice given to you by your health care provider. Make sure you discuss any questions you have with your health care provider. Document Released: 10/12/2007 Document Revised: 05/03/2016 Document Reviewed: 05/03/2016 Elsevier Interactive Patient Education  2019 [employer]  Warning Signs During Pregnancy A pregnancy lasts about 40 weeks, starting from the first day of your last period until the baby is born. Pregnancy is divided into three phases called trimesters.  The first trimester refers to week 1 through week 13 of pregnancy.  The second trimester is the start of week 14 through the end of week 27.  The third trimester is the  start of week 28 until you deliver your baby. During each trimester of pregnancy, certain signs and symptoms may indicate a problem. Talk with your health care provider about your current health and any medical conditions you have. Make sure you know the symptoms that you should watch for and report. How does this affect me?  Warning signs in the first trimester While some changes during the first trimester may be uncomfortable, most do not represent a serious problem. Let your health care provider know if you have any of the following warning signs in the first trimester:  You cannot eat or drink without vomiting, and this lasts for longer than a day.  You have vaginal bleeding or spotting along with menstrual-like cramping.  You have diarrhea for longer than a day.  You have a fever or other signs of infection, such as: ? Pain or burning when you urinate. ? Foul smelling or thick or yellowish vaginal discharge. Warning signs in the second trimester As your baby grows and changes during the second trimester, there are additional signs and symptoms that may indicate a problem. These include:  Signs and symptoms of infection, including a fever.  Signs or symptoms of a  miscarriage or preterm labor, such as regular contractions, menstrual-like cramping, or lower abdominal pain.  Bloody or watery vaginal discharge or obvious vaginal bleeding.  Feeling like your heart is pounding.  Having trouble breathing.  Nausea, vomiting, or diarrhea that lasts for longer than a day.  Craving non-food items, such as clay, chalk, or dirt. This may be a sign of a very treatable medical condition called pica. Later in your second trimester, watch for signs and symptoms of a serious medical condition called preeclampsia.These include:  Changes in your vision.  A severe headache that does not go away.  Nausea and vomiting. It is also important to notice if your baby stops moving or moves less than usual during this time. Warning signs in the third trimester As you approach the third trimester, your baby is growing and your body is preparing for the birth of your baby. In your third trimester, be sure to let your health care provider know if:  You have signs and symptoms of infection, including a fever.  You have vaginal bleeding.  You notice that your baby is moving less than usual or is not moving.  You have nausea, vomiting, or diarrhea that lasts for longer than a day.  You have a severe headache that does not go away.  You have vision changes, including seeing spots or having blurry or double vision.  You have increased swelling in your hands or face. How does this affect my baby? Throughout your pregnancy, always report any of the warning signs of a problem to your health care provider. This can help prevent complications that may affect your baby, including:  Increased risk for premature birth.  Infection that may be transmitted to your baby.  Increased risk for stillbirth. Contact a health care provider if:  You have any of the warning signs of a problem for the current trimester of your pregnancy.  Any of the following apply to you during any  trimester of pregnancy: ? You have strong emotions, such as sadness or anxiety, that interfere with work or personal relationships. ? You feel unsafe in your home and need help finding a safe place to live. ? You are using tobacco products, alcohol, or drugs and you need help to stop. Get help right away if:  You have signs or symptoms of labor before 37 weeks of pregnancy. These include:  Contractions that are 5 minutes or less apart, or that increase in frequency, intensity, or length.  Sudden, sharp abdominal pain or low back pain.  Uncontrolled gush or trickle of fluid from your vagina. Summary  A pregnancy lasts about 40 weeks, starting from the first day of your last period until the baby is born. Pregnancy is divided into three phases called trimesters. Each trimester has warning signs to watch for.  Always report any warning signs to your health care provider in order to prevent complications that may affect both you and your baby.  Talk with your health care provider about your current health and any medical conditions you have. Make sure you know the symptoms that you should watch for and report. This information is not intended to replace advice given to you by your health care provider. Make sure you discuss any questions you have with your health care provider. Document Released: 02/09/2017 Document Revised: 02/09/2017 Document Reviewed: 02/09/2017 Elsevier Interactive Patient Education  2019 [employer].  Breastfeeding  Choosing to breastfeed is one of the best decisions you can make for yourself and your baby. A change in hormones during pregnancy causes your breasts to make breast milk in your milk-producing glands. Hormones prevent breast milk from being released before your baby is born. They also prompt milk flow after birth. Once breastfeeding has begun, thoughts of your baby, as well as his or her sucking or crying, can stimulate the release of milk from your  milk-producing glands. Benefits of breastfeeding Research shows that breastfeeding offers many health benefits for infants and mothers. It also offers a cost-free and convenient way to feed your baby. For your baby  Your first milk (colostrum) helps your baby's digestive system to function better.  Special cells in your milk (antibodies) help your baby to fight off infections.  Breastfed babies are less likely to develop asthma, allergies, obesity, or type 2 diabetes. They are also at lower risk for sudden infant death syndrome (SIDS).  Nutrients in breast milk are better able to meet your baby's needs compared to infant formula.  Breast milk improves your baby's brain development. For you  Breastfeeding helps to create a very special bond between you and your baby.  Breastfeeding is convenient. Breast milk costs nothing and is always available at the correct temperature.  Breastfeeding helps to burn calories. It helps you to lose the weight that you gained during pregnancy.  Breastfeeding makes your uterus return faster to its size before pregnancy. It also slows bleeding (lochia) after you give birth.  Breastfeeding helps to lower your risk of developing type 2 diabetes, osteoporosis, rheumatoid arthritis, cardiovascular disease, and breast, ovarian, uterine, and endometrial cancer later in life. Breastfeeding basics Starting breastfeeding  Find a comfortable place to sit or lie down, with your neck and back well-supported.  Place a pillow or a rolled-up blanket under your baby to bring him or her to the level of your breast (if you are seated). Nursing pillows are specially designed to help support your arms and your baby while you breastfeed.  Make sure that your baby's tummy (abdomen) is facing your abdomen.  Gently massage your breast. With your fingertips, massage from the outer edges of your breast inward toward the nipple. This encourages milk flow. If your milk flows  slowly, you may need to continue this action during the feeding.  Support your breast with 4 fingers  underneath and your thumb above your nipple (make the letter "C" with your hand). Make sure your fingers are well away from your nipple and your baby's mouth.  Stroke your baby's lips gently with your finger or nipple.  When your baby's mouth is open wide enough, quickly bring your baby to your breast, placing your entire nipple and as much of the areola as possible into your baby's mouth. The areola is the colored area around your nipple. ? More areola should be visible above your baby's upper lip than below the lower lip. ? Your baby's lips should be opened and extended outward (flanged) to ensure an adequate, comfortable latch. ? Your baby's tongue should be between his or her lower gum and your breast.  Make sure that your baby's mouth is correctly positioned around your nipple (latched). Your baby's lips should create a seal on your breast and be turned out (everted).  It is common for your baby to suck about 2-3 minutes in order to start the flow of breast milk. Latching Teaching your baby how to latch onto your breast properly is very important. An improper latch can cause nipple pain, decreased milk supply, and poor weight gain in your baby. Also, if your baby is not latched onto your nipple properly, he or she may swallow some air during feeding. This can make your baby fussy. Burping your baby when you switch breasts during the feeding can help to get rid of the air. However, teaching your baby to latch on properly is still the best way to prevent fussiness from swallowing air while breastfeeding. Signs that your baby has successfully latched onto your nipple  Silent tugging or silent sucking, without causing you pain. Infant's lips should be extended outward (flanged).  Swallowing heard between every 3-4 sucks once your milk has started to flow (after your let-down milk reflex  occurs).  Muscle movement above and in front of his or her ears while sucking. Signs that your baby has not successfully latched onto your nipple  Sucking sounds or smacking sounds from your baby while breastfeeding.  Nipple pain. If you think your baby has not latched on correctly, slip your finger into the corner of your baby's mouth to break the suction and place it between your baby's gums. Attempt to start breastfeeding again. Signs of successful breastfeeding Signs from your baby  Your baby will gradually decrease the number of sucks or will completely stop sucking.  Your baby will fall asleep.  Your baby's body will relax.  Your baby will retain a small amount of milk in his or her mouth.  Your baby will let go of your breast by himself or herself. Signs from you  Breasts that have increased in firmness, weight, and size 1-3 hours after feeding.  Breasts that are softer immediately after breastfeeding.  Increased milk volume, as well as a change in milk consistency and color by the fifth day of breastfeeding.  Nipples that are not sore, cracked, or bleeding. Signs that your baby is getting enough milk  Wetting at least 1-2 diapers during the first 24 hours after birth.  Wetting at least 5-6 diapers every 24 hours for the first week after birth. The urine should be clear or pale yellow by the age of 5 days.  Wetting 6-8 diapers every 24 hours as your baby continues to grow and develop.  At least 3 stools in a 24-hour period by the age of 5 days. The stool should be soft and  yellow.  At least 3 stools in a 24-hour period by the age of 7 days. The stool should be seedy and yellow.  No loss of weight greater than 10% of birth weight during the first 3 days of life.  Average weight gain of 4-7 oz (113-198 g) per week after the age of 4 days.  Consistent daily weight gain by the age of 5 days, without weight loss after the age of 2 weeks. After a feeding, your baby may  spit up a small amount of milk. This is normal. Breastfeeding frequency and duration Frequent feeding will help you make more milk and can prevent sore nipples and extremely full breasts (breast engorgement). Breastfeed when you feel the need to reduce the fullness of your breasts or when your baby shows signs of hunger. This is called "breastfeeding on demand." Signs that your baby is hungry include:  Increased alertness, activity, or restlessness.  Movement of the head from side to side.  Opening of the mouth when the corner of the mouth or cheek is stroked (rooting).  Increased sucking sounds, smacking lips, cooing, sighing, or squeaking.  Hand-to-mouth movements and sucking on fingers or hands.  Fussing or crying. Avoid introducing a pacifier to your baby in the first 4-6 weeks after your baby is born. After this time, you may choose to use a pacifier. Research has shown that pacifier use during the first year of a baby's life decreases the risk of sudden infant death syndrome (SIDS). Allow your baby to feed on each breast as long as he or she wants. When your baby unlatches or falls asleep while feeding from the first breast, offer the second breast. Because newborns are often sleepy in the first few weeks of life, you may need to awaken your baby to get him or her to feed. Breastfeeding times will vary from baby to baby. However, the following rules can serve as a guide to help you make sure that your baby is properly fed:  Newborns (babies 60 weeks of age or younger) may breastfeed every 1-3 hours.  Newborns should not go without breastfeeding for longer than 3 hours during the day or 5 hours during the night.  You should breastfeed your baby a minimum of 8 times in a 24-hour period. Breast milk pumping     Pumping and storing breast milk allows you to make sure that your baby is exclusively fed your breast milk, even at times when you are unable to breastfeed. This is especially  important if you go back to work while you are still breastfeeding, or if you are not able to be present during feedings. Your lactation consultant can help you find a method of pumping that works best for you and give you guidelines about how long it is safe to store breast milk. Caring for your breasts while you breastfeed Nipples can become dry, cracked, and sore while breastfeeding. The following recommendations can help keep your breasts moisturized and healthy:  Avoid using soap on your nipples.  Wear a supportive bra designed especially for nursing. Avoid wearing underwire-style bras or extremely tight bras (sports bras).  Air-dry your nipples for 3-4 minutes after each feeding.  Use only cotton bra pads to absorb leaked breast milk. Leaking of breast milk between feedings is normal.  Use lanolin on your nipples after breastfeeding. Lanolin helps to maintain your skin's normal moisture barrier. Pure lanolin is not harmful (not toxic) to your baby. You may also hand express a few  drops of breast milk and gently massage that milk into your nipples and allow the milk to air-dry. In the first few weeks after giving birth, some women experience breast engorgement. Engorgement can make your breasts feel heavy, warm, and tender to the touch. Engorgement peaks within 3-5 days after you give birth. The following recommendations can help to ease engorgement:  Completely empty your breasts while breastfeeding or pumping. You may want to start by applying warm, moist heat (in the shower or with warm, water-soaked hand towels) just before feeding or pumping. This increases circulation and helps the milk flow. If your baby does not completely empty your breasts while breastfeeding, pump any extra milk after he or she is finished.  Apply ice packs to your breasts immediately after breastfeeding or pumping, unless this is too uncomfortable for you. To do this: ? Put ice in a plastic bag. ? Place a towel  between your skin and the bag. ? Leave the ice on for 20 minutes, 2-3 times a day.  Make sure that your baby is latched on and positioned properly while breastfeeding. If engorgement persists after 48 hours of following these recommendations, contact your health care provider or a Advertising copywriter. Overall health care recommendations while breastfeeding  Eat 3 healthy meals and 3 snacks every day. Well-nourished mothers who are breastfeeding need an additional 450-500 calories a day. You can meet this requirement by increasing the amount of a balanced diet that you eat.  Drink enough water to keep your urine pale yellow or clear.  Rest often, relax, and continue to take your prenatal vitamins to prevent fatigue, stress, and low vitamin and mineral levels in your body (nutrient deficiencies).  Do not use any products that contain nicotine or tobacco, such as cigarettes and e-cigarettes. Your baby may be harmed by chemicals from cigarettes that pass into breast milk and exposure to secondhand smoke. If you need help quitting, ask your health care provider.  Avoid alcohol.  Do not use illegal drugs or marijuana.  Talk with your health care provider before taking any medicines. These include over-the-counter and prescription medicines as well as vitamins and herbal supplements. Some medicines that may be harmful to your baby can pass through breast milk.  It is possible to become pregnant while breastfeeding. If birth control is desired, ask your health care provider about options that will be safe while breastfeeding your baby. Where to find more information: [employer]: www.llli.org Contact a health care provider if:  You feel like you want to stop breastfeeding or have become frustrated with breastfeeding.  Your nipples are cracked or bleeding.  Your breasts are red, tender, or warm.  You have: ? Painful breasts or nipples. ? A swollen area on either  breast. ? A fever or chills. ? Nausea or vomiting. ? Drainage other than breast milk from your nipples.  Your breasts do not become full before feedings by the fifth day after you give birth.  You feel sad and depressed.  Your baby is: ? Too sleepy to eat well. ? Having trouble sleeping. ? More than 64 week old and wetting fewer than 6 diapers in a 24-hour period. ? Not gaining weight by 33 days of age.  Your baby has fewer than 3 stools in a 24-hour period.  Your baby's skin or the white parts of his or her eyes become yellow. Get help right away if:  Your baby is overly tired (lethargic) and does not want to wake  up and feed.  Your baby develops an unexplained fever. Summary  Breastfeeding offers many health benefits for infant and mothers.  Try to breastfeed your infant when he or she shows early signs of hunger.  Gently tickle or stroke your baby's lips with your finger or nipple to allow the baby to open his or her mouth. Bring the baby to your breast. Make sure that much of the areola is in your baby's mouth. Offer one side and burp the baby before you offer the other side.  Talk with your health care provider or lactation consultant if you have questions or you face problems as you breastfeed. This information is not intended to replace advice given to you by your health care provider. Make sure you discuss any questions you have with your health care provider. Document Released: 04/25/2005 Document Revised: 05/27/2016 Document Reviewed: 05/27/2016 Elsevier Interactive Patient Education  2019 [employer].  Tests and Screening During Pregnancy Having certain tests and screenings during pregnancy is an important part of your prenatal care. These tests help your health care provider find problems that might affect your pregnancy. Some tests are done for all pregnant women, and some are optional. Most of the tests and screenings do not pose any risks for you or your baby. You  may need additional testing if any routine tests indicate a problem. Tests and screenings done in early pregnancy Some tests and screenings you can expect to have in early pregnancy include:  Blood tests, such as: ? Complete blood count (CBC). This test is done to check your red and white blood cells. It can help identify a risk for anemia, infection, or bleeding. ? Blood typing. This test determines your blood type as well as whether you have a certain protein in your red blood cells (Rh factor). If you do not have this protein (Rh negative) and your baby does have it (Rh positive), your body could make antibodies to the Rh factor. This could be dangerous to your baby's health. ? Tests to check for diseases that can cause birth defects or can be passed to your baby, such as:  [place] measles (rubella). The test indicates whether you are immune to rubella.  Hepatitis B and C. All women are tested for hepatitis B. You may also be tested for hepatitis C if you have risk factors for the condition.  Zika virus infection. You may have a blood or urine test to check for this infection if you or your partner has traveled to an area where the virus occurs.  Urine testing. A urine sample can be tested for diabetes, protein in your urine, and signs of infection.  Testing for sexually transmitted infections (STIs), such as HIV, syphilis, and chlamydia.  Testing for tuberculosis. You may have this skin test if you are at risk for tuberculosis.  Fetal ultrasound. This is an imaging study of your developing baby. It is done using sound waves and a computer. This test may be done at 11-14 weeks to confirm your pregnancy and help determine your due date. Tests and screenings done later in pregnancy Certain tests are done for the first time during later pregnancy. In addition, some of the tests that were done in early pregnancy are repeated at this time. Some common tests you can expect to have later in  pregnancy include:  Rh antibody testing. If you are Rh negative, you will have a blood test at about 28 weeks of pregnancy to see if you are producing  Rh antibodies. If you have not started to make antibodies, you will be given an injection to prevent you from making antibodies for the rest of your pregnancy.  Glucose screening. This tests your blood sugar to find out whether you are developing the type of diabetes that occurs during pregnancy (gestational diabetes). You may have this screening earlier if you have risk factors for diabetes.  Screening for group B streptococcus (GBS). GBS is a type of bacteria that may live in your rectum or vagina. You may have GBS without any symptoms. GBS can spread to your baby during birth. This test involves doing a rectal and vaginal swab at 35-37 weeks of pregnancy. If testing is positive for GBS, you may be treated with antibiotic medicine.  CBC to check for anemia and blood-clotting ability.  Urine tests to check for protein, which can be a sign of a condition called preeclampsia.  Fetal ultrasound. This may be repeated at 16-20 weeks to check how your baby is growing and developing. Screening for birth defects Some birth defects are caused by abnormal genes passed down through families. Early in your pregnancy, tests can be done to find out if your baby is at risk for a genetic disorder. This testing is optional. The type of testing recommended for you will depend on your family and medical history, your ethnicity, and your age. Testing may include:  Screening tests. These tests may include an ultrasound, blood tests, or a combination of both. The blood tests are used to check for abnormal genes, and the ultrasound is done to look for early birth defects.  Carrier screening. This test involves checking the blood or saliva of both parents to see if they carry abnormal genes that could be passed down to a baby. If genetic screening shows that your baby is  at risk for a genetic defect, additional diagnostic testing may be recommended, such as:  Amniocentesis. This involves testing a sample of fluid from your womb (amniotic fluid).  Chorionic villus sampling. In this test, a sample of cells from your placenta is checked for abnormal cells. Unlike other tests done during pregnancy, diagnostic testing does have some risk for your pregnancy. Talk to your health care provider about the risks and benefits of genetic testing. Where to find more information  American Pregnancy Association: americanpregnancy.org/prenatal-testing  Office on Women's Health: [employer].co.nz  March of Dimes: marchofdimes.org/pregnancy Questions to ask your health care provider  What routine tests are recommended for me?  When and how will these tests be done?  When will I get the results of routine tests?  What do the results of these tests mean for me or my baby?  Do you recommend any genetic screening tests? Which ones?  Should I see a genetic counselor before having genetic screening? Summary  Having tests and screenings during pregnancy is an important part of your prenatal care.  In early pregnancy, testing may be done to check blood type, Rh status, and risks for various conditions that can affect your baby.  Fetal ultrasound may be done in early pregnancy to confirm a pregnancy and later to look for any birth defects.  Later in pregnancy, tests may include screening for GBS and gestational diabetes.  Genetic testing is optional. Consider talking to a genetic counselor about this testing. This information is not intended to replace advice given to you by your health care provider. Make sure you discuss any questions you have with your health care provider. Document Released: 07/10/2017 Document  Revised: 07/10/2017 Document Reviewed: 07/10/2017 Elsevier Interactive Patient Education  2019 Elsevier Inc.  Morning Sickness  Morning sickness  is when you feel sick to your stomach (nauseous) during pregnancy. You may feel sick to your stomach and throw up (vomit). You may feel sick in the morning, but you can feel this way at any time of day. Some women feel very sick to their stomach and cannot stop throwing up (hyperemesis gravidarum). Follow these instructions at home: Medicines  Take over-the-counter and prescription medicines only as told by your doctor. Do not take any medicines until you talk with your doctor about them first.  Taking multivitamins before getting pregnant can stop or lessen the harshness of morning sickness. Eating and drinking  Eat dry toast or crackers before getting out of bed.  Eat 5 or 6 small meals a day.  Eat dry and bland foods like rice and baked potatoes.  Do not eat greasy, fatty, or spicy foods.  Have someone cook for you if the smell of food causes you to feel sick or throw up.  If you feel sick to your stomach after taking prenatal vitamins, take them at night or with a snack.  Eat protein when you need a snack. Nuts, yogurt, and cheese are good choices.  Drink fluids throughout the day.  Try ginger ale made with real ginger, ginger tea made from fresh grated ginger, or ginger candies. General instructions  Do not use any products that have nicotine or tobacco in them, such as cigarettes and e-cigarettes. If you need help quitting, ask your doctor.  Use an air purifier to keep the air in your house free of smells.  Get lots of fresh air.  Try to avoid smells that make you feel sick.  Try: ? Wearing a bracelet that is used for seasickness (acupressure wristband). ? Going to a doctor who puts thin needles into certain body points (acupuncture) to improve how you feel. Contact a doctor if:  You need medicine to feel better.  You feel dizzy or light-headed.  You are losing weight. Get help right away if:  You feel very sick to your stomach and cannot stop throwing up.  You  pass out (faint).  You have very bad pain in your belly. Summary  Morning sickness is when you feel sick to your stomach (nauseous) during pregnancy.  You may feel sick in the morning, but you can feel this way at any time of day.  Making some changes to what you eat may help your symptoms go away. This information is not intended to replace advice given to you by your health care provider. Make sure you discuss any questions you have with your health care provider. Document Released: 06/02/2004 Document Revised: 05/26/2016 Document Reviewed: 05/26/2016 Elsevier Interactive Patient Education  2019 [employer].

## 2018-07-09 NOTE — Progress Notes (Signed)
   PRENATAL INTAKE SUMMARY  Elizabeth Fisher presents today New OB Nurse Interview.  OB History    Gravida  2   Para      Term      Preterm      AB  1   Living        SAB  1   TAB      Ectopic      Multiple      Live Births             I have reviewed the patient's medical, obstetrical, social, and family histories, medications, and available lab results.  SUBJECTIVE She has no unusual complaints.  OBJECTIVE Initial nurse interview for history and lab work (New OB).  EDD: 01/23/2019 confirm by LMP GA: [redacted]w[redacted]d G2P0010 FHT: 160  Today's Vitals   07/09/18 1356  BP: 117/76  Pulse: (!) 108  Temp: 97.8 F (36.6 C)  Weight: 104 lb 6.4 oz (47.4 kg)    GENERAL APPEARANCE: alert, well appearing, in no apparent distress, oriented to person, place and time.   ASSESSMENT Normal pregnancy  PLAN Prenatal care-[facility] Pnl/HIV-completed at previous visit at [facility] Urine Culture/Dip GC/CT/PAP at next visit with Midwife HgbEval SMA/CF ([facility]) [facility] A1C Continue PNV  Genetic Screening Results Information: You are having genetic testing called [facility] today.  It will take approximately 2 weeks before the results are available.  To get your results, you need Internet access to a web browser to search [facility]/MyChart (the direct app on your phone will not give you these results).  Then select Lab Scanned and click on the blue hyper link that says View Image to see your [facility] results.  You can also use the directions on the purple card given to look up your results directly on the [facility] website.  Clovis Pu, RN

## 2018-07-11 LAB — HEMOGLOBINOPATHY EVALUATION
Ferritin: 42 ng/mL (ref 15–150)
HEMOGLOBIN: 13.5 g/dL (ref 11.1–15.9)
HGB C: 0 %
HGB F QUANT: 0 % (ref 0.0–2.0)
Hematocrit: 40.7 % (ref 34.0–46.6)
Hgb A2 Quant: 4.1 % — ABNORMAL HIGH (ref 1.8–3.2)
Hgb A: 63.8 % — ABNORMAL LOW (ref 96.4–98.8)
Hgb S: 32.1 % — ABNORMAL HIGH
Hgb Solubility: POSITIVE — AB
Hgb Variant: 0 %
MCH: 28.1 pg (ref 26.6–33.0)
MCHC: 33.2 g/dL (ref 31.5–35.7)
MCV: 85 fL (ref 79–97)
Platelets: 289 10*3/uL (ref 150–450)
RBC: 4.81 x10E6/uL (ref 3.77–5.28)
RDW: 13.2 % (ref 11.7–15.4)
WBC: 9.2 10*3/uL (ref 3.4–10.8)

## 2018-07-11 LAB — HEMOGLOBIN A1C
Est. average glucose Bld gHb Est-mCnc: 111 mg/dL
Hgb A1c MFr Bld: 5.5 % (ref 4.8–5.6)

## 2018-07-11 LAB — URINE CULTURE, OB REFLEX

## 2018-07-11 LAB — CULTURE, OB URINE

## 2018-07-16 ENCOUNTER — Inpatient Hospital Stay (HOSPITAL_COMMUNITY)
Admission: AD | Admit: 2018-07-16 | Discharge: 2018-07-16 | Disposition: A | Discharge status: Home / Self Care | Payer: Medicaid Other | Attending: Obstetrics and Gynecology | Admitting: Obstetrics and Gynecology

## 2018-07-16 ENCOUNTER — Other Ambulatory Visit: Payer: Self-pay

## 2018-07-16 ENCOUNTER — Encounter: Payer: Self-pay | Admitting: General Practice

## 2018-07-16 ENCOUNTER — Encounter (HOSPITAL_COMMUNITY): Payer: Self-pay

## 2018-07-16 DIAGNOSIS — Z3400 Encounter for supervision of normal first pregnancy, unspecified trimester: Secondary | ICD-10-CM

## 2018-07-16 DIAGNOSIS — Z3A12 12 weeks gestation of pregnancy: Secondary | ICD-10-CM | POA: Diagnosis not present

## 2018-07-16 DIAGNOSIS — O210 Mild hyperemesis gravidarum: Secondary | ICD-10-CM

## 2018-07-16 DIAGNOSIS — D573 Sickle-cell trait: Secondary | ICD-10-CM | POA: Diagnosis not present

## 2018-07-16 DIAGNOSIS — O99511 Diseases of the respiratory system complicating pregnancy, first trimester: Secondary | ICD-10-CM | POA: Diagnosis not present

## 2018-07-16 DIAGNOSIS — O99111 Other diseases of the blood and blood-forming organs and certain disorders involving the immune mechanism complicating pregnancy, first trimester: Secondary | ICD-10-CM | POA: Insufficient documentation

## 2018-07-16 DIAGNOSIS — Z79899 Other long term (current) drug therapy: Secondary | ICD-10-CM | POA: Insufficient documentation

## 2018-07-16 DIAGNOSIS — J45990 Exercise induced bronchospasm: Secondary | ICD-10-CM | POA: Diagnosis not present

## 2018-07-16 DIAGNOSIS — O219 Vomiting of pregnancy, unspecified: Secondary | ICD-10-CM | POA: Diagnosis not present

## 2018-07-16 LAB — URINALYSIS, ROUTINE W REFLEX MICROSCOPIC
Bilirubin Urine: NEGATIVE
GLUCOSE, UA: 150 mg/dL — AB
Hgb urine dipstick: NEGATIVE
Ketones, ur: 80 mg/dL — AB
Leukocytes,Ua: NEGATIVE
Nitrite: NEGATIVE
Protein, ur: NEGATIVE mg/dL
Specific Gravity, Urine: 1.03 (ref 1.005–1.030)
pH: 5 (ref 5.0–8.0)

## 2018-07-16 MED ORDER — LACTATED RINGERS IV BOLUS
1000.0000 mL | Freq: Once | INTRAVENOUS | Status: AC
  Administered 2018-07-16: 1000 mL via INTRAVENOUS

## 2018-07-16 MED ORDER — ONDANSETRON 4 MG PO TBDP
4.0000 mg | ORAL_TABLET | Freq: Once | ORAL | Status: AC
  Administered 2018-07-16: 4 mg via ORAL
  Filled 2018-07-16: qty 1

## 2018-07-16 MED ORDER — METOCLOPRAMIDE HCL 5 MG/ML IJ SOLN
5.0000 mg | Freq: Once | INTRAMUSCULAR | Status: AC
  Administered 2018-07-16: 5 mg via INTRAVENOUS
  Filled 2018-07-16: qty 2

## 2018-07-16 MED ORDER — PANTOPRAZOLE SODIUM 40 MG IV SOLR
40.0000 mg | Freq: Once | INTRAVENOUS | Status: AC
  Administered 2018-07-16: 40 mg via INTRAVENOUS
  Filled 2018-07-16: qty 40

## 2018-07-16 MED ORDER — THIAMINE HCL 100 MG/ML IJ SOLN
Freq: Once | INTRAVENOUS | Status: AC
  Administered 2018-07-16: 12:00:00 via INTRAVENOUS
  Filled 2018-07-16: qty 1000

## 2018-07-16 NOTE — [facility] Note (Signed)
Started vomiting again the last 3 days.  Medication doesn't seem to be working.

## 2018-07-16 NOTE — [facility] Provider Note (Signed)
History     CSN: [id number]  Arrival date and time: 07/16/18 3212   First Provider Initiated Contact with Patient 07/16/18 1025      Chief Complaint  Patient presents with  . Emesis   Elizabeth Fisher is a 28 y.o. G2P0010 at [redacted]w[redacted]d who presents for Emesis.  She states she is on "a bunch of medications" for nausea and vomiting and they have been working well since about 2 days ago.  She states that she has been throwing up despite maintaining compliance with her medications.  She states she been on a bland diet of toast and rice.  She reports that she has tried to take her medications, but has not been able to keep them down.  She states she wakes up with nausea that persists throughout the day.       OB History    Gravida  2   Para      Term      Preterm      AB  1   Living        SAB  1   TAB      Ectopic      Multiple      Live Births              Past Medical History:  Diagnosis Date  . Exercise-induced asthma   . Sickle cell trait (HCC)   . 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 Vomiting, unspecified     Past Surgical History:  Procedure Laterality Date  . UPPER GI ENDOSCOPY    . WISDOM TOOTH EXTRACTION      Family History  Problem Relation Age of Onset  . Post-traumatic stress disorder Mother     Social History   Tobacco Use  . Smoking status: Never Smoker  . Smokeless tobacco: Never Used  Substance Use Topics  . Alcohol use: Not Currently  . Drug use: Not Currently    Types: Marijuana    Allergies: No Known Allergies  Medications Prior to Admission  Medication Sig Dispense Refill Last Dose  . acetaminophen (TYLENOL) 325 MG tablet Take 2 tablets (650 mg total) by mouth every 4 (four) hours as needed (for pain scale < 4  OR  temperature  >/=  100.5 F). (Patient not taking: Reported on 07/09/2018)   Not Taking  . Doxylamine-Pyridoxine ER (BONJESTA) 20-20 MG TBCR Take 20 mg by mouth 2 (two) times daily. (Patient not taking: Reported  on 07/09/2018) 24 tablet 0 Not Taking  . ondansetron (ZOFRAN ODT) 4 MG disintegrating tablet Take 1 tablet (4 mg total) by mouth every 6 (six) hours as needed for nausea or vomiting. 30 tablet 2 Taking  . pantoprazole (PROTONIX) 40 MG tablet Take 1 tablet (40 mg total) by mouth daily for 30 days. 30 tablet 7 Taking  . Prenatal Vit-Fe Phos-FA-Omega (VITAFOL GUMMIES) 3.33-0.333-34.8 MG CHEW Chew 3 each by mouth daily. 90 tablet 12 Taking  . promethazine (PHENERGAN) 25 MG tablet Take 1 tablet (25 mg total) by mouth every 6 (six) hours for 14 days. 56 tablet 2   . scopolamine (TRANSDERM-SCOP) 1 MG/3DAYS Place 1 patch (1.5 mg total) onto the skin every 3 (three) days for 21 days. 7 patch 0 Taking    Review of Systems  Constitutional: Negative for chills and fever.  Gastrointestinal: Positive for constipation, nausea and vomiting. Negative for diarrhea.  Genitourinary: Negative for dysuria, vaginal bleeding and vaginal discharge.  Neurological: Negative for dizziness, light-headedness and headaches.  Physical Exam   Blood pressure (!) 124/92, pulse (!) 109, temperature 98.8 F (37.1 C), temperature source Oral, resp. rate 16, weight 45.6 kg, last menstrual period 04/18/2018, SpO2 100 %, unknown if currently breastfeeding.  Physical Exam  [facility]U Course  Procedures  MDM Physical Exam Start IV LR bolus f/b Banana Bag Antiemetics Assessment and Plan  28 year old G2P0010 at 12.5 weeks N/V  -Discussed POC to include: *Start IV with fluids *Reglan 10mg  IV *Oral Challenge *Reassess  -Patient without q/c.  Follow Up (11:44 AM) -Nurse reports patient with symptoms of flushing and dizziness s/p initiation of banana bag. -Provider in room and banana bag discontinued. -Patient stable with c/o dizziness, but breathing without difficulty and no rashes noted. -Plan to discontinue banana bag and give LR bolus now. -Will continue to monitor.  Follow Up (1:10 PM) -Patient reports improvement in  nausea symptoms -Given Zofran 4mg  ODT without incident -Instructed to restart medications as prescribed -Instructed to follow up with primary office and provider will send email for appt. -Bleeding Precautions -Encouraged to call or return to [facility] if symptoms worsen or with the onset of new symptoms. -Discharged to home in stable condition   Cherre Robins MSN, CNM 07/16/2018, 10:26 AM

## 2018-07-16 NOTE — Discharge Instructions (Signed)
Bland Diet  A bland diet consists of foods that are often soft and do not have a lot of fat, fiber, or extra seasonings. Foods without fat, fiber, or seasoning are easier for the body to digest. They are also less likely to irritate your mouth, throat, stomach, and other parts of your digestive system. A bland diet is sometimes called a BRAT diet.  What is my plan?  Your health care provider or food and nutrition specialist (dietitian) may recommend specific changes to your diet to prevent symptoms or to treat your symptoms. These changes may include:   Eating small meals often.   Cooking food until it is soft enough to chew easily.   Chewing your food well.   Drinking fluids slowly.   Not eating foods that are very spicy, sour, or fatty.   Not eating citrus fruits, such as oranges and grapefruit.  What do I need to know about this diet?   Eat a variety of foods from the bland diet food list.   Do not follow a bland diet longer than needed.   Ask your health care provider whether you should take vitamins or supplements.  What foods can I eat?  Grains    Hot cereals, such as cream of wheat. Rice. Bread, crackers, or tortillas made from refined white flour.  Vegetables  Canned or cooked vegetables. Mashed or boiled potatoes.  Fruits    Bananas. Applesauce. Other types of cooked or canned fruit with the skin and seeds removed, such as canned peaches or pears.  Meats and other proteins    Scrambled eggs. Creamy peanut butter or other nut butters. Lean, well-cooked meats, such as chicken or fish. Tofu. Soups or broths.  Dairy  Low-fat dairy products, such as milk, cottage cheese, or yogurt.  Beverages    Water. Herbal tea. Apple juice.  Fats and oils  Mild salad dressings. [place]ola or olive oil.  Sweets and desserts  Pudding. Custard. Fruit gelatin. Ice cream.  The items listed above may not be a complete list of recommended foods and beverages. Contact a dietitian for more options.  What foods are not  recommended?  Grains  Whole grain breads and cereals.  Vegetables  Raw vegetables.  Fruits  Raw fruits, especially citrus, berries, or dried fruits.  Dairy  Whole fat dairy foods.  Beverages  Caffeinated drinks. Alcohol.  Seasonings and condiments  Strongly flavored seasonings or condiments. Hot sauce. Salsa.  Other foods  Spicy foods. Fried foods. Sour foods, such as pickled or fermented foods. Foods with high sugar content. Foods high in fiber.  The items listed above may not be a complete list of foods and beverages to avoid. Contact a dietitian for more information.  Summary   A bland diet consists of foods that are often soft and do not have a lot of fat, fiber, or extra seasonings.   Foods without fat, fiber, or seasoning are easier for the body to digest.   Check with your health care provider to see how long you should follow this diet plan. It is not meant to be followed for long periods.  This information is not intended to replace advice given to you by your health care provider. Make sure you discuss any questions you have with your health care provider.  Document Released: 08/17/2015 Document Revised: 05/24/2017 Document Reviewed: 05/24/2017  Elsevier Interactive Patient Education  2019 [employer]vier Inc.

## 2018-07-25 ENCOUNTER — Encounter: Payer: Self-pay | Admitting: General Practice

## 2018-08-03 ENCOUNTER — Encounter: Payer: Medicaid Other | Admitting: Obstetrics and Gynecology

## 2018-08-08 ENCOUNTER — Other Ambulatory Visit: Payer: Self-pay

## 2018-08-08 ENCOUNTER — Ambulatory Visit (INDEPENDENT_AMBULATORY_CARE_PROVIDER_SITE_OTHER): Payer: Medicaid Other | Admitting: Obstetrics and Gynecology

## 2018-08-08 ENCOUNTER — Other Ambulatory Visit (HOSPITAL_COMMUNITY)
Admission: RE | Admit: 2018-08-08 | Discharge: 2018-08-08 | Disposition: A | Discharge status: Home / Self Care | Payer: Medicaid Other | Source: Ambulatory Visit | Attending: Obstetrics and Gynecology | Admitting: Obstetrics and Gynecology

## 2018-08-08 VITALS — BP 139/94 | HR 75 | Temp 98.1°F | Wt 101.8 lb

## 2018-08-08 DIAGNOSIS — O26899 Other specified pregnancy related conditions, unspecified trimester: Secondary | ICD-10-CM

## 2018-08-08 DIAGNOSIS — Z124 Encounter for screening for malignant neoplasm of cervix: Secondary | ICD-10-CM | POA: Insufficient documentation

## 2018-08-08 DIAGNOSIS — O26892 Other specified pregnancy related conditions, second trimester: Secondary | ICD-10-CM

## 2018-08-08 DIAGNOSIS — Z3A16 16 weeks gestation of pregnancy: Secondary | ICD-10-CM

## 2018-08-08 DIAGNOSIS — N898 Other specified noninflammatory disorders of vagina: Secondary | ICD-10-CM

## 2018-08-08 DIAGNOSIS — O162 Unspecified maternal hypertension, second trimester: Secondary | ICD-10-CM

## 2018-08-08 DIAGNOSIS — Z3400 Encounter for supervision of normal first pregnancy, unspecified trimester: Secondary | ICD-10-CM

## 2018-08-08 NOTE — Patient Instructions (Signed)
Hypertension During Pregnancy  Hypertension is also called high blood pressure. High blood pressure means that the force of your blood moving in your body is too strong. When you are pregnant, this condition should be watched carefully. It can cause problems for you and your baby. Follow these instructions at home: Eating and drinking   Drink enough fluid to keep your pee (urine) pale yellow.  Avoid caffeine. Lifestyle  Do not use any products that contain nicotine or tobacco, such as cigarettes and e-cigarettes. If you need help quitting, ask your doctor.  Do not use alcohol or drugs.  Avoid stress.  Rest and get plenty of sleep. General instructions  Take over-the-counter and prescription medicines only as told by your doctor.  While lying down, lie on your left side. This keeps pressure off your major blood vessels.  While sitting or lying down, raise (elevate) your feet. Try putting some pillows under your lower legs.  Exercise regularly. Ask your doctor what kinds of exercise are best for you.  Keep all prenatal and follow-up visits as told by your doctor. This is important. Contact a doctor if:  You have symptoms that your doctor told you to watch for, such as: ? Throwing up (vomiting). ? Feeling sick to your stomach (nausea). ? Headache. Get help right away if you have:  Very bad belly pain that does not get better with treatment.  A very bad headache that does not get better.  Throwing up that does not get better with treatment.  Sudden, fast weight gain.  Sudden swelling in your hands, ankles, or face.  Bleeding from your vagina.  Blood in your pee.  Fewer movements from your baby than usual.  Blurry vision.  Double vision.  Muscle twitching.  Sudden muscle tightening (spasms).  Trouble breathing.  Blue fingernails or lips. Summary  Hypertension is also called high blood pressure. High blood pressure means that the force of your blood moving  in your body is too strong.  When you are pregnant, this condition should be watched carefully. It can cause problems for you and your baby.  Get help right away if you have symptoms that your doctor told you to watch for. This information is not intended to replace advice given to you by your health care provider. Make sure you discuss any questions you have with your health care provider. Document Released: 05/28/2010 Document Revised: 04/11/2017 Document Reviewed: 01/05/2016 Elsevier Interactive Patient Education  2019 Elsevier Inc.  Healthy [employer] Gain During Pregnancy, Adult A certain amount of weight gain during pregnancy is normal and healthy. How much weight you should gain depends on your overall health and a measurement called BMI (body mass index). BMI is an estimate of your body fat based on your height and weight. You can use an Software engineer to figure out your BMI, or you can ask your health care provider to calculate it for you at your next visit. Your recommended pregnancy weight gain is based on your pre-pregnancy BMI. General guidelines for a healthy total weight gain during pregnancy are listed below. If your BMI at or before the start of your pregnancy is:  Less than 18.5 (underweight), you should gain 28-40 lb (13-18 kg).  18.5-24.9 (normal weight), you should gain 25-35 lb (11-16 kg).  25-29.9 (overweight), you should gain 15-25 lb (7-11 kg).  30 or higher (obese), you should gain 11-20 lb (5-9 kg). These ranges vary depending on your individual health. If you are carrying more than one  baby (multiples), it may be safe to gain more weight than these recommendations. If you gain less weight than recommended, that may be safe as long as your baby is growing and developing normally. How can unhealthy weight gain affect me and my baby? Gaining too much weight during pregnancy can lead to pregnancy complications, such as:  A temporary form of diabetes that develops  during pregnancy (gestational diabetes).  High blood pressure during pregnancy and protein in your urine (preeclampsia).  High blood pressure during pregnancy without protein in your urine (gestational hypertension).  Your baby having a high weight at birth, which may: ? Raise your risk of having a more difficult delivery or a surgical delivery (cesarean delivery, or C-section). ? Raise your child's risk of developing obesity during childhood. Not gaining enough weight can be life-threatening for your baby, and it may raise your baby's chances of:  Being born early (preterm).  Growing more slowly than normal during pregnancy (growth restriction).  Having a low weight at birth. What actions can I take to gain a healthy amount of weight during pregnancy? General instructions  Keep track of your weight gain during pregnancy.  Take over-the-counter and prescription medicines only as told by your health care provider. Take all prenatal supplements as directed.  Keep all health care visits during pregnancy (prenatal visits). These visits are a good time to discuss your weight gain. Your health care provider will weigh you at each visit to make sure you are gaining a healthy amount of weight. Nutrition   Eat a balanced, nutrient-rich diet. Eat plenty of: ? Fruits and vegetables, such as berries and broccoli. ? Whole grains, such as millet, barley, whole-wheat breads and cereals, and oatmeal. ? Low-fat dairy products or non-dairy products such as almond milk or rice milk. ? Protein foods, such as lean meat, chicken, eggs, and legumes (such as peas, beans, soybeans, and lentils).  Avoid foods that are fried or have a lot of fat, salt (sodium), or sugar.  Drink enough fluid to keep your urine pale yellow.  Choose healthy snack and drink options when you are at work or on the go: ? Drink water. Avoid soda, sports drinks, and juices that have added sugar. ? Avoid drinks with caffeine, such  as coffee and energy drinks. ? Eat snacks that are high in protein, such as nuts, protein bars, and low-fat yogurt. ? Carry convenient snacks in your purse that do not need refrigeration, such as a pack of trail mix, an apple, or a granola bar.  If you need help improving your diet, work with a health care provider or a diet and nutrition specialist (dietitian). Activity   Exercise regularly, as told by your health care provider. ? If you were active before becoming pregnant, you may be able to continue your regular fitness activities. ? If you were not active before pregnancy, you may gradually build up to exercising for 30 or more minutes on most days of the week. This may include walking, swimming, or yoga.  Ask your health care provider what activities are safe for you. Talk with your health care provider about whether you may need to be excused from certain school or work activities. Where to find more information Learn more about managing your weight gain during pregnancy from:  American Pregnancy Association: www.americanpregnancy.org  U.[place]. Department of Agriculture pregnancy weight gain calculator: https://ball-collins.biz/ Summary  Too much weight gain during pregnancy can lead to complications for you and your baby.  Find out  your pre-pregnancy BMI to determine how much weight gain is healthy for you.  Eat nutritious foods and stay active.  Keep all of your prenatal visits as told by your health care provider. This information is not intended to replace advice given to you by your health care provider. Make sure you discuss any questions you have with your health care provider. Document Released: 01/13/2017 Document Revised: 01/13/2017 Document Reviewed: 01/13/2017 Elsevier Interactive Patient Education  2019 Elsevier Inc.  Hyperemesis Gravidarum Hyperemesis gravidarum is a severe form of nausea and vomiting that happens during pregnancy. Hyperemesis is worse than morning  sickness. It may cause you to have nausea or vomiting all day for many days. It may keep you from eating and drinking enough food and liquids, which can lead to dehydration, malnutrition, and weight loss. Hyperemesis usually occurs during the first half (the first 20 weeks) of pregnancy. It often goes away once a woman is in her second half of pregnancy. However, sometimes hyperemesis continues through an entire pregnancy. What are the causes? The cause of this condition is not known. It may be related to changes in chemicals (hormones) in the body during pregnancy, such as the high level of pregnancy hormone (human chorionic gonadotropin) or the increase in the female sex hormone (estrogen). What are the signs or symptoms? Symptoms of this condition include:  Nausea that does not go away.  Vomiting that does not allow you to keep any food down.  Weight loss.  Body fluid loss (dehydration).  Having no desire to eat, or not liking food that you have previously enjoyed. How is this diagnosed? This condition may be diagnosed based on:  A physical exam.  Your medical history.  Your symptoms.  Blood tests.  Urine tests. How is this treated? This condition is managed by controlling symptoms. This may include:  Following an eating plan. This can help lessen nausea and vomiting.  Taking prescription medicines. An eating plan and medicines are often used together to help control symptoms. If medicines do not help relieve nausea and vomiting, you may need to receive fluids through an IV at the hospital. Follow these instructions at home: Eating and drinking   Avoid the following: ? Drinking fluids with meals. Try not to drink anything during the 30 minutes before and after your meals. ? Drinking more than 1 cup of fluid at a time. ? Eating foods that trigger your symptoms. These may include spicy foods, coffee, high-fat foods, very sweet foods, and acidic foods. ? Skipping meals.  Nausea can be more intense on an empty stomach. If you cannot tolerate food, do not force it. Try sucking on ice chips or other frozen items and make up for missed calories later. ? Lying down within 2 hours after eating. ? Being exposed to environmental triggers. These may include food smells, smoky rooms, closed spaces, rooms with strong smells, warm or humid places, overly loud and noisy rooms, and rooms with motion or flickering lights. Try eating meals in a well-ventilated area that is free of strong smells. ? Quick and sudden changes in your movement. ? Taking iron pills and multivitamins that contain iron. If you take prescription iron pills, do not stop taking them unless your health care provider approves. ? Preparing food. The smell of food can spoil your appetite or trigger nausea.  To help relieve your symptoms: ? Listen to your body. Everyone is different and has different preferences. Find what works best for you. ? Eat and drink slowly. ?  Eat 5-6 small meals daily instead of 3 large meals. Eating small meals and snacks can help you avoid an empty stomach. ? In the morning, before getting out of bed, eat a couple of crackers to avoid moving around on an empty stomach. ? Try eating starchy foods as these are usually tolerated well. Examples include cereal, toast, bread, potatoes, pasta, rice, and pretzels. ? Include at least 1 serving of protein with your meals and snacks. Protein options include lean meats, poultry, seafood, beans, nuts, nut butters, eggs, cheese, and yogurt. ? Try eating a protein-rich snack before bed. Examples of a protein-rick snack include cheese and crackers or a peanut butter sandwich made with 1 slice of whole-wheat bread and 1 tsp (5 g) of peanut butter. ? Eat or suck on things that have ginger in them. It may help relieve nausea. Add  tsp ground ginger to hot tea or choose ginger tea. ? Try drinking 100% fruit juice or an electrolyte drink. An electrolyte  drink contains sodium, potassium, and chloride. ? Drink fluids that are cold, clear, and carbonated or sour. Examples include lemonade, ginger ale, lemon-lime soda, ice water, and sparkling water. ? Brush your teeth or use a mouth rinse after meals. ? Talk with your health care provider about starting a supplement of vitamin B6. General instructions  Take over-the-counter and prescription medicines only as told by your health care provider.  Follow instructions from your health care provider about eating or drinking restrictions.  Continue to take your prenatal vitamins as told by your health care provider. If you are having trouble taking your prenatal vitamins, talk with your health care provider about different options.  Keep all follow-up and pre-birth (prenatal) visits as told by your health care provider. This is important. Contact a health care provider if:  You have pain in your abdomen.  You have a severe headache.  You have vision problems.  You are losing weight.  You feel weak or dizzy. Get help right away if:  You cannot drink fluids without vomiting.  You vomit blood.  You have constant nausea and vomiting.  You are very weak.  You faint.  You have a fever and your symptoms suddenly get worse. Summary  Hyperemesis gravidarum is a severe form of nausea and vomiting that happens during pregnancy.  Making some changes to your eating habits may help relieve nausea and vomiting.  This condition may be managed with medicine.  If medicines do not help relieve nausea and vomiting, you may need to receive fluids through an IV at the hospital. This information is not intended to replace advice given to you by your health care provider. Make sure you discuss any questions you have with your health care provider. Document Released: 04/25/2005 Document Revised: 05/15/2017 Document Reviewed: 12/23/2015 Elsevier Interactive Patient Education  2019 [employer]

## 2018-08-08 NOTE — Progress Notes (Signed)
Subjective:    Elizabeth Fisher is being seen today for her first obstetrical visit.  This is not a planned pregnancy. She is at [redacted]w[redacted]d gestation. Her obstetrical history is significant for hyperemesis gravidarum (currently), but getting better with meds. Relationship with FOB (Adris): significant other, not living together. Patient does intend to breast feed. Pregnancy history fully reviewed.  Patient reports nausea and vomiting.  Review of Systems:   Review of Systems  Constitutional: Negative.   HENT: Negative.   Eyes: Negative.   Respiratory: Negative.   Cardiovascular: Negative.   Gastrointestinal: Positive for nausea and vomiting.  Endocrine: Negative.   Genitourinary: Negative.   Musculoskeletal: Negative.   Skin: Negative.   Allergic/Immunologic: Negative.   Neurological: Negative.   Hematological: Negative.   Psychiatric/Behavioral: Negative.     Objective:     BP (!) 139/94   Pulse 75   Temp 98.1 F (36.7 C)   Wt 101 lb 12.8 oz (46.2 kg)   LMP 04/18/2018 (Exact Date)   BMI 17.47 kg/m  Total weight loss to-date = 6 lbs Physical Exam  Nursing note and vitals reviewed. Constitutional: She is oriented to person, place, and time. She appears well-developed and well-nourished.  HENT:  Head: Normocephalic and atraumatic.  Right Ear: External ear normal.  Left Ear: External ear normal.  Nose: Nose normal.  Mouth/Throat: Oropharynx is clear and moist.  Eyes: Pupils are equal, round, and reactive to light. Conjunctivae and EOM are normal.  Neck: Normal range of motion. Neck supple.  Cardiovascular: Normal rate, regular rhythm, normal heart sounds and intact distal pulses.  Respiratory: Effort normal and breath sounds normal.  GI: Soft. Bowel sounds are normal.  Genitourinary:    Vulva normal.     Genitourinary Comments: Uterus: gravid, S=D, SE: cervix is smooth, pink, no lesions, moderate amt of thick, white vaginal d/c -- Pap, GC/CT done, closed/long/firm, no CMT,  ++ friability after pap - bleeding stopped with several large cotton swabs, no adnexal tenderness    Musculoskeletal: Normal range of motion.  Neurological: She is alert and oriented to person, place, and time. She has normal reflexes.  Skin: Skin is warm and dry.  Psychiatric: She has a normal mood and affect. Her behavior is normal. Judgment and thought content normal.    Maternal Exam:  Abdomen: Patient reports no abdominal tenderness. Fundal height is 16.    Introitus: Normal vulva. Normal vagina.  Ferning test: not done.  Nitrazine test: not done. Amniotic fluid character: not assessed.  Pelvis: adequate for delivery.   Cervix: Cervix evaluated by sterile speculum exam and digital exam.     Fetal Exam Fetal Monitor Review: Mode: hand-held doppler probe.   Baseline rate: 159 bpm.       Assessment:    Pregnancy: G2P0010 Patient Active Problem List   Diagnosis Date Noted  . Supervision of normal first pregnancy, antepartum 07/09/2018  . Transient hypertension of pregnancy 06/24/2018  . Hyperemesis gravidarum with metabolic disturbance, antepartum 06/22/2018    Plan:    Initial lab results reviewed. Prenatal vitamins. Still taking Zofran.   Problem list reviewed and updated. AFP3 discussed: ordered. Role of ultrasound in pregnancy discussed; fetal survey: ordered. Amniocentesis discussed: not indicated. Advised that bleeding should be light and eventually go away. Explained that cervix is friable and pap broom made it easy to bleed. The nature of [facility] with multiple MDs and other Advanced Practice Providers was explained to patient; also emphasized that residents, students  are part of our team. Follow up in 4 weeks for telehealth/Webex. Webex meeting number given. 50% of 40 min visit spent on counseling and coordination of care.     Raelyn Mora, MSN, CNM 08/08/2018

## 2018-08-09 LAB — CBC
Hematocrit: 41.8 % (ref 34.0–46.6)
Hemoglobin: 14 g/dL (ref 11.1–15.9)
MCH: 27.8 pg (ref 26.6–33.0)
MCHC: 33.5 g/dL (ref 31.5–35.7)
MCV: 83 fL (ref 79–97)
Platelets: 296 10*3/uL (ref 150–450)
RBC: 5.03 x10E6/uL (ref 3.77–5.28)
RDW: 13.5 % (ref 11.7–15.4)
WBC: 7.3 10*3/uL (ref 3.4–10.8)

## 2018-08-09 LAB — COMPREHENSIVE METABOLIC PANEL
ALT: 26 IU/L (ref 0–32)
AST: 23 IU/L (ref 0–40)
Albumin/Globulin Ratio: 1.7 (ref 1.2–2.2)
Albumin: 4.5 g/dL (ref 3.9–5.0)
Alkaline Phosphatase: 54 IU/L (ref 39–117)
BUN/Creatinine Ratio: 12 (ref 9–23)
BUN: 8 mg/dL (ref 6–20)
Bilirubin Total: 0.4 mg/dL (ref 0.0–1.2)
CO2: 19 mmol/L — ABNORMAL LOW (ref 20–29)
Calcium: 10.3 mg/dL — ABNORMAL HIGH (ref 8.7–10.2)
Chloride: 100 mmol/L (ref 96–106)
Creatinine, Ser: 0.68 mg/dL (ref 0.57–1.00)
GFR calc Af Amer: 138 mL/min/{1.73_m2} (ref >59)
GFR calc non Af Amer: 119 mL/min/{1.73_m2} (ref >59)
Globulin, Total: 2.7 g/dL (ref 1.5–4.5)
Glucose: 102 mg/dL — ABNORMAL HIGH (ref 65–99)
Potassium: 4 mmol/L (ref 3.5–5.2)
Sodium: 138 mmol/L (ref 134–144)
Total Protein: 7.2 g/dL (ref 6.0–8.5)

## 2018-08-09 LAB — PROTEIN / CREATININE RATIO, URINE
Creatinine, Urine: 267.2 mg/dL
Protein, Ur: 28.3 mg/dL
Protein/Creat Ratio: 106 mg/g creat (ref 0–200)

## 2018-08-09 LAB — CERVICOVAGINAL ANCILLARY ONLY
Bacterial vaginitis: POSITIVE — AB
Candida vaginitis: POSITIVE — AB
Chlamydia: NEGATIVE
Neisseria Gonorrhea: NEGATIVE
Trichomonas: NEGATIVE

## 2018-08-10 ENCOUNTER — Telehealth: Payer: Self-pay | Admitting: *Deleted

## 2018-08-10 DIAGNOSIS — B3731 Acute candidiasis of vulva and vagina: Secondary | ICD-10-CM

## 2018-08-10 DIAGNOSIS — B9689 Other specified bacterial agents as the cause of diseases classified elsewhere: Secondary | ICD-10-CM

## 2018-08-10 DIAGNOSIS — O23599 Infection of other part of genital tract in pregnancy, unspecified trimester: Principal | ICD-10-CM

## 2018-08-10 DIAGNOSIS — B373 Candidiasis of vulva and vagina: Secondary | ICD-10-CM

## 2018-08-10 LAB — AFP TETRA
DIA Mom Value: 0.59
DIA Value (EIA): 130.09 pg/mL
DSR (By Age)    1 IN: 827
DSR (Second Trimester) 1 IN: 10000
Gestational Age: 16 WEEKS
MSAFP Mom: 1.18
MSAFP: 56.6 ng/mL
MSHCG Mom: 0.64
MSHCG: 36315 m[IU]/mL
Maternal Age At EDD: 28.4 yr
Osb Risk: 10000
T18 (By Age): 1:3224 {titer}
Test Results:: NEGATIVE
Weight: 101 [lb_av]
uE3 Mom: 0.92
uE3 Value: 0.94 ng/mL

## 2018-08-10 LAB — CYTOLOGY - PAP: Diagnosis: NEGATIVE

## 2018-08-10 MED ORDER — TERCONAZOLE 0.4 % VA CREA: 1.0000 | TOPICAL_CREAM | Freq: Every day | VAGINAL | 0 refills | Status: DC

## 2018-08-10 MED ORDER — METRONIDAZOLE 500 MG PO TABS: 500.0000 mg | ORAL_TABLET | Freq: Two times a day (BID) | ORAL | 0 refills | Status: DC

## 2018-08-10 NOTE — Telephone Encounter (Signed)
-----   Message from Raelyn Mora, [place] sent at 08/09/2018  4:45 PM EDT ----- Treat BV and then yeast after completion of BV treatment

## 2018-08-23 ENCOUNTER — Telehealth: Payer: Self-pay | Admitting: *Deleted

## 2018-08-23 NOTE — Telephone Encounter (Signed)
Patient call with concerns of HIV test results. Patient verified DOB and address. HIV results were Non-reactive from 06/2018.  Clovis Pu, RN

## 2018-08-29 ENCOUNTER — Other Ambulatory Visit: Payer: Self-pay

## 2018-08-29 ENCOUNTER — Other Ambulatory Visit: Payer: Self-pay | Admitting: Obstetrics and Gynecology

## 2018-08-29 ENCOUNTER — Ambulatory Visit (HOSPITAL_COMMUNITY)
Admission: RE | Admit: 2018-08-29 | Discharge: 2018-08-29 | Disposition: A | Discharge status: Home / Self Care | Payer: Medicaid Other | Source: Ambulatory Visit | Attending: Obstetrics and Gynecology | Admitting: Obstetrics and Gynecology

## 2018-08-29 DIAGNOSIS — Z3400 Encounter for supervision of normal first pregnancy, unspecified trimester: Secondary | ICD-10-CM | POA: Diagnosis present

## 2018-08-29 DIAGNOSIS — Z363 Encounter for antenatal screening for malformations: Secondary | ICD-10-CM

## 2018-08-29 DIAGNOSIS — Z862 Personal history of diseases of the blood and blood-forming organs and certain disorders involving the immune mechanism: Secondary | ICD-10-CM

## 2018-08-29 DIAGNOSIS — Z3A19 19 weeks gestation of pregnancy: Secondary | ICD-10-CM

## 2018-08-30 ENCOUNTER — Other Ambulatory Visit (HOSPITAL_COMMUNITY): Payer: Self-pay | Admitting: *Deleted

## 2018-08-30 DIAGNOSIS — Z362 Encounter for other antenatal screening follow-up: Secondary | ICD-10-CM

## 2018-09-05 ENCOUNTER — Encounter: Payer: Self-pay | Admitting: Obstetrics and Gynecology

## 2018-09-05 ENCOUNTER — Ambulatory Visit (INDEPENDENT_AMBULATORY_CARE_PROVIDER_SITE_OTHER): Payer: Medicaid Other | Admitting: Obstetrics and Gynecology

## 2018-09-05 ENCOUNTER — Other Ambulatory Visit: Payer: Self-pay

## 2018-09-05 DIAGNOSIS — Z3402 Encounter for supervision of normal first pregnancy, second trimester: Secondary | ICD-10-CM

## 2018-09-05 DIAGNOSIS — Z3400 Encounter for supervision of normal first pregnancy, unspecified trimester: Secondary | ICD-10-CM

## 2018-09-05 DIAGNOSIS — Z3A20 20 weeks gestation of pregnancy: Secondary | ICD-10-CM

## 2018-09-05 NOTE — Progress Notes (Signed)
   TELEHEALTH VIRTUAL OBSTETRICS VISIT ENCOUNTER NOTE  I connected with [name] on 09/05/18 at  1:30 PM EDT by Webex at home and verified that I am speaking with the correct person using two identifiers.   I discussed the limitations, risks, security and privacy concerns of performing an evaluation and management service by telephone and the availability of in person appointments. I also discussed with the patient that there may be a patient responsible charge related to this service. The patient expressed understanding and agreed to proceed.  Subjective:  Elizabeth Fisher is a 28 y.o. G2P0010 at [redacted]w[redacted]d being followed for ongoing prenatal care.  She is currently monitored for the following issues for this low-risk pregnancy and has Hyperemesis gravidarum with metabolic disturbance, antepartum; Transient hypertension of pregnancy; and Supervision of normal first pregnancy, antepartum on their problem list.  Patient reports no complaints. She reports her N/V is "much better than it was before." She is able to eat and drink without difficulty. She does have to take Zofran occasionally. She feels more nausea or vomits when her stomach is empty. She is eating more fruits (strawberries and bananas). Reports fetal movement. Denies any contractions, bleeding or leaking of fluid.   The following portions of the patient's history were reviewed and updated as appropriate: allergies, current medications, past family history, past medical history, past social history, past surgical history and problem list.   Objective:   General:  Alert, oriented and cooperative.   Mental Status: Normal mood and affect perceived. Normal judgment and thought content.  Rest of physical exam deferred due to type of encounter  Assessment and Plan:  Pregnancy: G2P0010 at [redacted]w[redacted]d 1. Supervision of normal first pregnancy, antepartum - Advised to eat small frequent protein snacks/meals every 2-3 hours, eat bananas, grapes  and other naturally high in sugar fruits in moderation, and increase daily water intake. - Anticipatory guidance for 2 hr GTT nv  Preterm labor symptoms and general obstetric precautions including but not limited to vaginal bleeding, contractions, leaking of fluid and fetal movement were reviewed in detail with the patient.  I discussed the assessment and treatment plan with the patient. The patient was provided an opportunity to ask questions and all were answered. The patient agreed with the plan and demonstrated an understanding of the instructions. The patient was advised to call back or seek an in-person office evaluation/go to [facility] at [facility] for any urgent or concerning symptoms. Please refer to After Visit Summary for other counseling recommendations.   I provided 10 minutes of non-face-to-face time during this encounter. There was 5 minutes of chart review time spent prior to this encounter. Total time spent = 15 minutes.  No follow-ups on file.  Future Appointments  Date Time Provider Department Center  10/10/2018  1:00 PM [facility] NURSE [facility]  10/10/2018  1:00 PM [facility] [place] 3 [facility]  10/31/2018  8:10 AM Raelyn Mora, CNM [facility] None    Raelyn Mora, CNM Center for [employer], [facility]

## 2018-10-10 ENCOUNTER — Ambulatory Visit (HOSPITAL_COMMUNITY)
Admission: RE | Admit: 2018-10-10 | Discharge: 2018-10-10 | Disposition: A | Discharge status: Home / Self Care | Payer: Medicaid Other | Source: Ambulatory Visit | Attending: Obstetrics and Gynecology | Admitting: Obstetrics and Gynecology

## 2018-10-10 ENCOUNTER — Encounter (HOSPITAL_COMMUNITY): Payer: Self-pay

## 2018-10-10 ENCOUNTER — Other Ambulatory Visit: Payer: Self-pay

## 2018-10-10 ENCOUNTER — Ambulatory Visit (HOSPITAL_COMMUNITY): Payer: Medicaid Other | Admitting: *Deleted

## 2018-10-10 VITALS — BP 123/76 | HR 84 | Temp 98.4°F

## 2018-10-10 DIAGNOSIS — Z362 Encounter for other antenatal screening follow-up: Secondary | ICD-10-CM | POA: Insufficient documentation

## 2018-10-10 DIAGNOSIS — Z3A25 25 weeks gestation of pregnancy: Secondary | ICD-10-CM

## 2018-10-10 DIAGNOSIS — O99019 Anemia complicating pregnancy, unspecified trimester: Secondary | ICD-10-CM | POA: Diagnosis present

## 2018-10-10 DIAGNOSIS — D573 Sickle-cell trait: Secondary | ICD-10-CM

## 2018-10-10 DIAGNOSIS — Z3400 Encounter for supervision of normal first pregnancy, unspecified trimester: Secondary | ICD-10-CM

## 2018-10-31 ENCOUNTER — Ambulatory Visit (INDEPENDENT_AMBULATORY_CARE_PROVIDER_SITE_OTHER): Payer: Medicaid Other | Admitting: Obstetrics and Gynecology

## 2018-10-31 ENCOUNTER — Encounter: Payer: Self-pay | Admitting: Obstetrics and Gynecology

## 2018-10-31 ENCOUNTER — Other Ambulatory Visit: Payer: Self-pay

## 2018-10-31 ENCOUNTER — Encounter: Payer: Self-pay | Admitting: General Practice

## 2018-10-31 VITALS — BP 113/72 | HR 83 | Temp 98.1°F | Wt 119.0 lb

## 2018-10-31 DIAGNOSIS — Z3403 Encounter for supervision of normal first pregnancy, third trimester: Secondary | ICD-10-CM | POA: Diagnosis not present

## 2018-10-31 DIAGNOSIS — Z3A28 28 weeks gestation of pregnancy: Secondary | ICD-10-CM

## 2018-10-31 DIAGNOSIS — Z23 Encounter for immunization: Secondary | ICD-10-CM

## 2018-10-31 DIAGNOSIS — Z3400 Encounter for supervision of normal first pregnancy, unspecified trimester: Secondary | ICD-10-CM

## 2018-10-31 NOTE — Patient Instructions (Signed)
Iron-Rich Diet  Iron is a mineral that helps your body to produce hemoglobin. Hemoglobin is a protein in red blood cells that carries oxygen to your body's tissues. Eating too little iron may cause you to feel weak and tired, and it can increase your risk of infection. Iron is naturally found in many foods, and many foods have iron added to them (iron-fortified foods). You may need to follow an iron-rich diet if you do not have enough iron in your body due to certain medical conditions. The amount of iron that you need each day depends on your age, your sex, and any medical conditions you have. Follow instructions from your health care provider or a diet and nutrition specialist (dietitian) about how much iron you should eat each day. What are tips for following this plan? Reading food labels  Check food labels to see how many milligrams (mg) of iron are in each serving. Cooking  Cook foods in pots and pans that are made from iron.  Take these steps to make it easier for your body to absorb iron from certain foods: ? Soak beans overnight before cooking. ? Soak whole grains overnight and drain them before using. ? Ferment flours before baking, such as by using yeast in bread dough. Meal planning  When you eat foods that contain iron, you should eat them with foods that are high in vitamin C. These include oranges, peppers, tomatoes, potatoes, and mango. Vitamin C helps your body to absorb iron. General information  Take iron supplements only as told by your health care provider. An overdose of iron can be life-threatening. If you were prescribed iron supplements, take them with orange juice or a vitamin C supplement.  When you eat iron-fortified foods or take an iron supplement, you should also eat foods that naturally contain iron, such as meat, poultry, and fish. Eating naturally iron-rich foods helps your body to absorb the iron that is added to other foods or contained in a supplement.   Certain foods and drinks prevent your body from absorbing iron properly. Avoid eating these foods in the same meal as iron-rich foods or with iron supplements. These foods include: ? Coffee, black tea, and red wine. ? Milk, dairy products, and foods that are high in calcium. ? Beans and soybeans. ? Whole grains. What foods should I eat? Fruits Prunes. Raisins. Eat fruits high in vitamin C, such as oranges, grapefruits, and strawberries, alongside iron-rich foods. Vegetables Spinach (cooked). Green peas. Broccoli. Fermented vegetables. Eat vegetables high in vitamin C, such as leafy greens, potatoes, bell peppers, and tomatoes, alongside iron-rich foods. Grains Iron-fortified breakfast cereal. Iron-fortified whole-wheat bread. Enriched rice. Sprouted grains. Meats and other proteins Beef liver. Oysters. Beef. Shrimp. [place]. Chicken. [place]. Sardines. Chickpeas. Nuts. Tofu. Pumpkin seeds. Beverages Tomato juice. Fresh orange juice. Prune juice. Hibiscus tea. Fortified instant breakfast shakes. Sweets and desserts Blackstrap molasses. Seasonings and condiments Tahini. Fermented soy sauce. Other foods Wheat germ. The items listed above may not be a complete list of recommended foods and beverages. Contact a dietitian for more information. What foods should I avoid? Grains Whole grains. Bran cereal. Bran flour. Oats. Meats and other proteins Soybeans. Products made from soy protein. Black beans. Lentils. Mung beans. Split peas. Dairy Milk. Cream. Cheese. Yogurt. Cottage cheese. Beverages Coffee. Black tea. Red wine. Sweets and desserts Cocoa. Chocolate. Ice cream. Other foods Basil. Oregano. Large amounts of parsley. The items listed above may not be a complete list of foods and beverages to avoid.  Contact a dietitian for more information. Summary  Iron is a mineral that helps your body to produce hemoglobin. Hemoglobin is a protein in red blood cells that carries oxygen to your  body's tissues.  Iron is naturally found in many foods, and many foods have iron added to them (iron-fortified foods).  When you eat foods that contain iron, you should eat them with foods that are high in vitamin C. Vitamin C helps your body to absorb iron.  Certain foods and drinks prevent your body from absorbing iron properly, such as whole grains and dairy products. You should avoid eating these foods in the same meal as iron-rich foods or with iron supplements. This information is not intended to replace advice given to you by your health care provider. Make sure you discuss any questions you have with your health care provider. Document Released: 12/07/2004 Document Revised: 03/21/2017 Document Reviewed: 03/21/2017 Elsevier Interactive Patient Education  2019 [place].  Fetal Movement Counts Patient Name: ________________________________________________ Patient Due Date: ____________________ What is a fetal movement count?  A fetal movement count is the number of times that you feel your baby move during a certain amount of time. This may also be called a fetal kick count. A fetal movement count is recommended for every pregnant woman. You may be asked to start counting fetal movements as early as week 28 of your pregnancy. Pay attention to when your baby is most active. You may notice your baby's sleep and wake cycles. You may also notice things that make your baby move more. You should do a fetal movement count:  When your baby is normally most active.  At the same time each day. A good time to count movements is while you are resting, after having something to eat and drink. How do I count fetal movements? 1. Find a quiet, comfortable area. Sit, or lie down on your side. 2. Write down the date, the start time and stop time, and the number of movements that you felt between those two times. Take this information with you to your health care visits. 3. For 2 hours, count kicks,  flutters, swishes, rolls, and jabs. You should feel at least 10 movements during 2 hours. 4. You may stop counting after you have felt 10 movements. 5. If you do not feel 10 movements in 2 hours, have something to eat and drink. Then, keep resting and counting for 1 hour. If you feel at least 4 movements during that hour, you may stop counting. Contact a health care provider if:  You feel fewer than 4 movements in 2 hours.  Your baby is not moving like he or she usually does. Date: ____________ Start time: ____________ Stop time: ____________ Movements: ____________ Date: ____________ Start time: ____________ Stop time: ____________ Movements: ____________ Date: ____________ Start time: ____________ Stop time: ____________ Movements: ____________ Date: ____________ Start time: ____________ Stop time: ____________ Movements: ____________ Date: ____________ Start time: ____________ Stop time: ____________ Movements: ____________ Date: ____________ Start time: ____________ Stop time: ____________ Movements: ____________ Date: ____________ Start time: ____________ Stop time: ____________ Movements: ____________ Date: ____________ Start time: ____________ Stop time: ____________ Movements: ____________ Date: ____________ Start time: ____________ Stop time: ____________ Movements: ____________ This information is not intended to replace advice given to you by your health care provider. Make sure you discuss any questions you have with your health care provider. Document Released: 05/25/2006 Document Revised: 12/23/2015 Document Reviewed: 06/04/2015 Elsevier Interactive Patient Education  2019 [employer].

## 2018-10-31 NOTE — Progress Notes (Signed)
   PRENATAL VISIT NOTE  Subjective:  Elizabeth Fisher is a 28 y.o. G2P0010 at [redacted]w[redacted]d being seen today for ongoing prenatal care.  She is currently monitored for the following issues for this low-risk pregnancy and has Hyperemesis gravidarum with metabolic disturbance, antepartum; Transient hypertension of pregnancy; and Supervision of normal first pregnancy, antepartum on their problem list.  Patient reports no complaints.  Contractions: Not present. Vag. Bleeding: None.  Movement: Present. Denies leaking of fluid.   The following portions of the patient's history were reviewed and updated as appropriate: allergies, current medications, past family history, past medical history, past social history, past surgical history and problem list.   Objective:   Vitals:   10/31/18 0812  BP: 113/72  Pulse: 83  Temp: 98.1 F (36.7 C)  Weight: 119 lb (54 kg)    Fetal Status: Fetal Heart Rate (bpm): 152 Fundal Height: 26 cm Movement: Present  Presentation: Undeterminable  General:  Alert, oriented and cooperative. Patient is in no acute distress.  Skin: Skin is warm and dry. No rash noted.   Cardiovascular: Normal heart rate noted  Respiratory: Normal respiratory effort, no problems with respiration noted  Abdomen: Soft, gravid, appropriate for gestational age.  Pain/Pressure: Absent     Pelvic: Cervical exam deferred        Extremities: Normal range of motion.  Edema: None  Mental Status: Normal mood and affect. Normal behavior. Normal judgment and thought content.   Assessment and Plan:  Pregnancy: G2P0010 at [redacted]w[redacted]d 1. Supervision of normal first pregnancy, antepartum - Glucose Tolerance, 2 Hours w/1 Hour -- pt vomited the glucola a few minutes after ingesting it  she opts to reschedule until next week  will eat 35 Brach's jelly beans for her 75 grams glucose load - HIV Antibody (routine testing w rflx) - RPR - CBC - Tdap vaccine greater than or equal to 7yo IM - Culture, OB Urine (SCT)  - Discussed desire to have a lotus type birth. She desires to get the maximum benefit from the placenta. Advised that the best benefit is within the first 5 mins after delivery. Advised that we delay cord clamping for 60 secs as a standard, but can certainly delay for 5 mins.   Preterm labor symptoms and general obstetric precautions including but not limited to vaginal bleeding, contractions, leaking of fluid and fetal movement were reviewed in detail with the patient. Please refer to After Visit Summary for other counseling recommendations.   Return in about 5 weeks (around 12/05/2018) for Return OB - My Chart video.  Future Appointments  Date Time Provider [place]  11/06/2018  8:10 AM [facility] None  12/06/2018  2:00 PM Laury Deep, CNM [facility] None  12/27/2018 10:30 AM Laury Deep, CNM [facility] None    Laury Deep, CNM

## 2018-11-01 LAB — CBC
Hematocrit: 31.1 % — ABNORMAL LOW (ref 34.0–46.6)
Hemoglobin: 10.4 g/dL — ABNORMAL LOW (ref 11.1–15.9)
MCH: 26.7 pg (ref 26.6–33.0)
MCHC: 33.4 g/dL (ref 31.5–35.7)
MCV: 80 fL (ref 79–97)
Platelets: 311 10*3/uL (ref 150–450)
RBC: 3.9 x10E6/uL (ref 3.77–5.28)
RDW: 11.8 % (ref 11.7–15.4)
WBC: 11.8 10*3/uL — ABNORMAL HIGH (ref 3.4–10.8)

## 2018-11-01 LAB — RPR: RPR Ser Ql: NONREACTIVE

## 2018-11-01 LAB — HIV ANTIBODY (ROUTINE TESTING W REFLEX): HIV Screen 4th Generation wRfx: NONREACTIVE

## 2018-11-02 LAB — URINE CULTURE, OB REFLEX

## 2018-11-02 LAB — CULTURE, OB URINE

## 2018-11-06 ENCOUNTER — Other Ambulatory Visit: Payer: Self-pay

## 2018-11-06 ENCOUNTER — Other Ambulatory Visit (INDEPENDENT_AMBULATORY_CARE_PROVIDER_SITE_OTHER): Payer: Medicaid Other | Admitting: *Deleted

## 2018-11-06 DIAGNOSIS — Z3400 Encounter for supervision of normal first pregnancy, unspecified trimester: Secondary | ICD-10-CM

## 2018-11-06 NOTE — Addendum Note (Signed)
Addended by: Derl Barrow on: 11/06/2018 08:11 AM   Modules accepted: Orders

## 2018-11-07 LAB — GLUCOSE TOLERANCE, 2 HOURS W/ 1HR
Glucose, 1 hour: 105 mg/dL (ref 65–179)
Glucose, 2 hour: 95 mg/dL (ref 65–152)
Glucose, Fasting: 85 mg/dL (ref 65–91)

## 2018-12-06 ENCOUNTER — Telehealth (INDEPENDENT_AMBULATORY_CARE_PROVIDER_SITE_OTHER): Payer: Medicaid Other | Admitting: Obstetrics and Gynecology

## 2018-12-06 ENCOUNTER — Other Ambulatory Visit: Payer: Self-pay

## 2018-12-06 ENCOUNTER — Encounter: Payer: Self-pay | Admitting: Obstetrics and Gynecology

## 2018-12-06 DIAGNOSIS — Z3400 Encounter for supervision of normal first pregnancy, unspecified trimester: Secondary | ICD-10-CM

## 2018-12-06 DIAGNOSIS — Z3403 Encounter for supervision of normal first pregnancy, third trimester: Secondary | ICD-10-CM

## 2018-12-06 DIAGNOSIS — Z3A33 33 weeks gestation of pregnancy: Secondary | ICD-10-CM

## 2018-12-06 NOTE — Patient Instructions (Signed)
Contraception Choices Contraception, also called birth control, refers to methods or devices that prevent pregnancy. Hormonal methods Contraceptive implant (can be done in the hospital)  A contraceptive implant is a thin, plastic tube that contains a hormone. It is inserted into the upper part of the arm. It can remain in place for up to 3 years. Progestin-only injections (can be done in the hospital) Progestin-only injections are injections of progestin, a synthetic form of the hormone progesterone. They are given every 3 months by a health care provider. Birth control pills  Birth control pills are pills that contain hormones that prevent pregnancy. They must be taken once a day, preferably at the same time each day. Birth control patch  The birth control patch contains hormones that prevent pregnancy. It is placed on the skin and must be changed once a week for three weeks and removed on the fourth week. A prescription is needed to use this method of contraception. Vaginal ring  A vaginal ring contains hormones that prevent pregnancy. It is placed in the vagina for three weeks and removed on the fourth week. After that, the process is repeated with a new ring. A prescription is needed to use this method of contraception. Emergency contraceptive Emergency contraceptives prevent pregnancy after unprotected sex. They come in pill form and can be taken up to 5 days after sex. They work best the sooner they are taken after having sex. Most emergency contraceptives are available without a prescription. This method should not be used as your only form of birth control. Barrier methods Female condom  A female condom is a thin sheath that is worn over the penis during sex. Condoms keep sperm from going inside a woman's body. They can be used with a spermicide to increase their effectiveness. They should be disposed after a single use. Female condom  A female condom is a soft, loose-fitting sheath that  is put into the vagina before sex. The condom keeps sperm from going inside a woman's body. They should be disposed after a single use. Diaphragm  A diaphragm is a soft, dome-shaped barrier. It is inserted into the vagina before sex, along with a spermicide. The diaphragm blocks sperm from entering the uterus, and the spermicide kills sperm. A diaphragm should be left in the vagina for 6-8 hours after sex and removed within 24 hours. A diaphragm is prescribed and fitted by a health care provider. A diaphragm should be replaced every 1-2 years, after giving birth, after gaining more than 15 lb (6.8 kg), and after pelvic surgery. Cervical cap  A cervical cap is a round, soft latex or plastic cup that fits over the cervix. It is inserted into the vagina before sex, along with spermicide. It blocks sperm from entering the uterus. The cap should be left in place for 6-8 hours after sex and removed within 48 hours. A cervical cap must be prescribed and fitted by a health care provider. It should be replaced every 2 years. Sponge  A sponge is a soft, circular piece of polyurethane foam with spermicide on it. The sponge helps block sperm from entering the uterus, and the spermicide kills sperm. To use it, you make it wet and then insert it into the vagina. It should be inserted before sex, left in for at least 6 hours after sex, and removed and thrown away within 30 hours. Spermicides Spermicides are chemicals that kill or block sperm from entering the cervix and uterus. They can come as a cream,  jelly, suppository, foam, or tablet. A spermicide should be inserted into the vagina with an applicator at least 44-01 minutes before sex to allow time for it to work. The process must be repeated every time you have sex. Spermicides do not require a prescription. Intrauterine contraception Intrauterine device (IUD) (can be done in the hospital) An IUD is a T-shaped device that is put in a woman's uterus. There are  two types:  Hormone IUD.This type contains progestin, a synthetic form of the hormone progesterone. This type can stay in place for 3-5 years.  Copper IUD.This type is wrapped in copper wire. It can stay in place for 10 years.  Permanent methods of contraception Female tubal ligation In this method, a woman's fallopian tubes are sealed, tied, or blocked during surgery to prevent eggs from traveling to the uterus. Hysteroscopic sterilization In this method, a small, flexible insert is placed into each fallopian tube. The inserts cause scar tissue to form in the fallopian tubes and block them, so sperm cannot reach an egg. The procedure takes about 3 months to be effective. Another form of birth control must be used during those 3 months. Female sterilization This is a procedure to tie off the tubes that carry sperm (vasectomy). After the procedure, the man can still ejaculate fluid (semen). Natural planning methods Natural family planning In this method, a couple does not have sex on days when the woman could become pregnant. Calendar method This means keeping track of the length of each menstrual cycle, identifying the days when pregnancy can happen, and not having sex on those days. Ovulation method In this method, a couple avoids sex during ovulation. Symptothermal method This method involves not having sex during ovulation. The woman typically checks for ovulation by watching changes in her temperature and in the consistency of cervical mucus. Post-ovulation method In this method, a couple waits to have sex until after ovulation. Summary  Contraception, also called birth control, means methods or devices that prevent pregnancy.  Hormonal methods of contraception include implants, injections, pills, patches, vaginal rings, and emergency contraceptives.  Barrier methods of contraception can include female condoms, female condoms, diaphragms, cervical caps, sponges, and spermicides.   There are two types of IUDs (intrauterine devices). An IUD can be put in a woman's uterus to prevent pregnancy for 3-5 years.  Permanent sterilization can be done through a procedure for males, females, or both.  Natural family planning methods involve not having sex on days when the woman could become pregnant. This information is not intended to replace advice given to you by your health care provider. Make sure you discuss any questions you have with your health care provider. Document Released: 04/25/2005 Document Revised: 04/27/2017 Document Reviewed: 05/28/2016 Elsevier Patient Education  2020 [employer].

## 2018-12-06 NOTE — Progress Notes (Signed)
   MY CHART VIDEO VIRTUAL OBSTETRICS VISIT ENCOUNTER NOTE  I connected with Elizabeth Fisher on 12/06/18 at  2:00 PM EDT by My Chart video at home and verified that I am speaking with the correct person using two identifiers.   I discussed the limitations, risks, security and privacy concerns of performing an evaluation and management service by My Chart video and the availability of in person appointments. I also discussed with the patient that there may be a patient responsible charge related to this service. The patient expressed understanding and agreed to proceed.  Subjective:  Elizabeth Fisher is a 28 y.o. G2P0010 at [redacted]w[redacted]d being followed for ongoing prenatal care.  She is currently monitored for the following issues for this low-risk pregnancy and has Hyperemesis gravidarum with metabolic disturbance, antepartum; Transient hypertension of pregnancy; and Supervision of normal first pregnancy, antepartum on their problem list.  Patient reports no complaints. Reports fetal movement. Denies any contractions, bleeding or leaking of fluid.   The following portions of the patient's history were reviewed and updated as appropriate: allergies, current medications, past family history, past medical history, past social history, past surgical history and problem list.   Objective:   General:  Alert, oriented and cooperative.   Mental Status: Normal mood and affect perceived. Normal judgment and thought content.  Rest of physical exam deferred due to type of encounter  BP 125/82   Pulse 88   Wt 121 lb (54.9 kg)   LMP 04/18/2018 (Exact Date)   BMI 20.77 kg/m  **Done by patient's own at home BP cuff and scale  Assessment and Plan:  Pregnancy: G2P0010 at [redacted]w[redacted]d  1. Supervision of normal first pregnancy, antepartum - Anticipatory guidance for GBS and cervical exam nv  Preterm labor symptoms and general obstetric precautions including but not limited to vaginal bleeding, contractions, leaking of  fluid and fetal movement were reviewed in detail with the patient.  I discussed the assessment and treatment plan with the patient. The patient was provided an opportunity to ask questions and all were answered. The patient agreed with the plan and demonstrated an understanding of the instructions. The patient was advised to call back or seek an in-person office evaluation/go to [facility] at [facility] for any urgent or concerning symptoms. Please refer to After Visit Summary for other counseling recommendations.   I provided 5 minutes of non-face-to-face time during this encounter. There was 5 minutes of chart review time spent prior to this encounter. Total time spent = 10 minutes.  Return in about 3 weeks (around 12/27/2018) for Return OB w/GBS.  Future Appointments  Date Time Provider [place]  12/27/2018 10:30 AM Laury Deep, CNM [facility] None    Laury Deep, [place] for [employer], [place]

## 2018-12-27 ENCOUNTER — Other Ambulatory Visit (HOSPITAL_COMMUNITY)
Admission: RE | Admit: 2018-12-27 | Discharge: 2018-12-27 | Disposition: A | Discharge status: Home / Self Care | Payer: Medicaid Other | Source: Ambulatory Visit | Attending: Obstetrics and Gynecology | Admitting: Obstetrics and Gynecology

## 2018-12-27 ENCOUNTER — Other Ambulatory Visit: Payer: Self-pay

## 2018-12-27 ENCOUNTER — Ambulatory Visit (INDEPENDENT_AMBULATORY_CARE_PROVIDER_SITE_OTHER): Payer: Medicaid Other | Admitting: Obstetrics and Gynecology

## 2018-12-27 VITALS — BP 122/82 | HR 86 | Temp 98.0°F | Wt 128.0 lb

## 2018-12-27 DIAGNOSIS — Z3400 Encounter for supervision of normal first pregnancy, unspecified trimester: Secondary | ICD-10-CM | POA: Diagnosis present

## 2018-12-27 DIAGNOSIS — Z3403 Encounter for supervision of normal first pregnancy, third trimester: Secondary | ICD-10-CM

## 2018-12-27 DIAGNOSIS — Z3A36 36 weeks gestation of pregnancy: Secondary | ICD-10-CM

## 2018-12-27 NOTE — Progress Notes (Signed)
   PRENATAL VISIT NOTE  Subjective:  Elizabeth Fisher is a 28 y.o. G2P0010 at [redacted]w[redacted]d being seen today for ongoing prenatal care.  She is currently monitored for the following issues for this low-risk pregnancy and has Hyperemesis gravidarum with metabolic disturbance, antepartum; Transient hypertension of pregnancy; and Supervision of normal first pregnancy, antepartum on their problem list.  Patient reports occasional sharp, shooting pain down inner thighs.  Contractions: Irregular. Vag. Bleeding: None.  Movement: Present. Denies leaking of fluid.   The following portions of the patient's history were reviewed and updated as appropriate: allergies, current medications, past family history, past medical history, past social history, past surgical history and problem list.   Objective:   Vitals:   12/27/18 1102  BP: 122/82  Pulse: 86  Temp: 98 F (36.7 C)  Weight: 128 lb (58.1 kg)    Fetal Status: Fetal Heart Rate (bpm): 146 Fundal Height: 34 cm Movement: Present  Presentation: Vertex  General:  Alert, oriented and cooperative. Patient is in no acute distress.  Skin: Skin is warm and dry. No rash noted.   Cardiovascular: Normal heart rate noted  Respiratory: Normal respiratory effort, no problems with respiration noted  Abdomen: Soft, gravid, appropriate for gestational age.  Pain/Pressure: Present     Pelvic: Cervical exam performed Dilation: Closed Effacement (%): 40 Station: -1  Extremities: Normal range of motion.  Edema: None  Mental Status: Normal mood and affect. Normal behavior. Normal judgment and thought content.   Assessment and Plan:  Pregnancy: G2P0010 at [redacted]w[redacted]d 1. Supervision of normal first pregnancy, antepartum - Discussed GBS and if (+) will need abx in labor - Culture, beta strep (group b only) - Cervicovaginal ancillary only( [place])  Preterm labor symptoms and general obstetric precautions including but not limited to vaginal bleeding, contractions,  leaking of fluid and fetal movement were reviewed in detail with the patient. Please refer to After Visit Summary for other counseling recommendations.   Return in about 2 weeks (around 01/10/2019) for Return OB - My Chart video.  Future Appointments  Date Time Provider [place]  01/10/2019  9:10 AM Laury Deep, CNM [facility] None  01/17/2019  2:10 PM Laury Deep, [place] None    Laury Deep, [place]

## 2018-12-29 LAB — CERVICOVAGINAL ANCILLARY ONLY
Bacterial vaginitis: NEGATIVE
Candida vaginitis: NEGATIVE
Chlamydia: NEGATIVE
Neisseria Gonorrhea: NEGATIVE
Trichomonas: NEGATIVE

## 2018-12-29 LAB — OB RESULTS CONSOLE GBS: GBS: NEGATIVE

## 2018-12-31 LAB — CULTURE, BETA STREP (GROUP B ONLY): Strep Gp B Culture: NEGATIVE

## 2019-01-10 ENCOUNTER — Other Ambulatory Visit: Payer: Self-pay

## 2019-01-10 ENCOUNTER — Encounter: Payer: Self-pay | Admitting: Obstetrics and Gynecology

## 2019-01-10 ENCOUNTER — Telehealth (INDEPENDENT_AMBULATORY_CARE_PROVIDER_SITE_OTHER): Payer: Medicaid Other | Admitting: Obstetrics and Gynecology

## 2019-01-10 VITALS — BP 122/78 | HR 78 | Wt 125.0 lb

## 2019-01-10 DIAGNOSIS — R12 Heartburn: Secondary | ICD-10-CM

## 2019-01-10 DIAGNOSIS — Z3A38 38 weeks gestation of pregnancy: Secondary | ICD-10-CM

## 2019-01-10 DIAGNOSIS — O26893 Other specified pregnancy related conditions, third trimester: Secondary | ICD-10-CM

## 2019-01-10 DIAGNOSIS — Z3400 Encounter for supervision of normal first pregnancy, unspecified trimester: Secondary | ICD-10-CM

## 2019-01-10 NOTE — Patient Instructions (Signed)
Take the Protonix 40 mg tablet daily

## 2019-01-10 NOTE — Progress Notes (Signed)
   MY CHART VIDEO VIRTUAL OBSTETRICS VISIT ENCOUNTER NOTE  I connected with Elizabeth Fisher on 01/10/19 at  9:10 AM EDT by My Chart video at home and verified that I am speaking with the correct person using two identifiers.   I discussed the limitations, risks, security and privacy concerns of performing an evaluation and management service by My Chart video and the availability of in person appointments. I also discussed with the patient that there may be a patient responsible charge related to this service. The patient expressed understanding and agreed to proceed.  Subjective:  Elizabeth Fisher is a 28 y.o. G2P0010 at [redacted]w[redacted]d being followed for ongoing prenatal care.  She is currently monitored for the following issues for this low-risk pregnancy and has Hyperemesis gravidarum with metabolic disturbance, antepartum; Transient hypertension of pregnancy; and Supervision of normal first pregnancy, antepartum on their problem list.  Patient reports heartburn in the middle of the night that wakes her up. Reports fetal movement. Denies any contractions, bleeding or leaking of fluid.   The following portions of the patient's history were reviewed and updated as appropriate: allergies, current medications, past family history, past medical history, past social history, past surgical history and problem list.   Objective:   General:  Alert, oriented and cooperative.   Mental Status: Normal mood and affect perceived. Normal judgment and thought content.  Rest of physical exam deferred due to type of encounter  BP 122/78   Pulse 78   Wt 125 lb (56.7 kg)   LMP 04/18/2018 (Exact Date)   BMI 21.46 kg/m  **Done by patient's own at home BP cuff and scale  Assessment and Plan:  Pregnancy: G2P0010 at [redacted]w[redacted]d  1. Heartburn during pregnancy in third trimester - Advised to take Protonix 40 mg daily that was Rx'd in 06/2018 - Encouraged to stay sitting up for at least 30 minutes after eating, watch the  amount of acidic foods in diet  2. Supervision of normal first pregnancy, antepartum - Anticipatory guidance for labor - Discussed labor plans -- planning to go "all natural," but will keep epidural as an option if she can't take it anymore.  Term labor symptoms and general obstetric precautions including but not limited to vaginal bleeding, contractions, leaking of fluid and fetal movement were reviewed in detail with the patient.  I discussed the assessment and treatment plan with the patient. The patient was provided an opportunity to ask questions and all were answered. The patient agreed with the plan and demonstrated an understanding of the instructions. The patient was advised to call back or seek an in-person office evaluation/go to MAU at [facility] for any urgent or concerning symptoms. Please refer to After Visit Summary for other counseling recommendations.   I provided 10 minutes of non-face-to-face time during this encounter. There was 5 minutes of chart review time spent prior to this encounter. Total time spent = 15 minutes.  Return in about 1 week (around 01/17/2019) for Return OB visit.  Future Appointments  Date Time Provider [place]  01/17/2019  2:10 PM Laury Deep, CNM [facility] None    Laury Deep, [place] for [employer], [place]

## 2019-01-12 ENCOUNTER — Inpatient Hospital Stay (HOSPITAL_BASED_OUTPATIENT_CLINIC_OR_DEPARTMENT_OTHER): Payer: Medicaid Other

## 2019-01-12 ENCOUNTER — Encounter (HOSPITAL_COMMUNITY): Payer: Self-pay | Admitting: *Deleted

## 2019-01-12 ENCOUNTER — Telehealth: Payer: Self-pay | Admitting: Obstetrics and Gynecology

## 2019-01-12 ENCOUNTER — Inpatient Hospital Stay (HOSPITAL_COMMUNITY)
Admission: AD | Admit: 2019-01-12 | Discharge: 2019-01-12 | Disposition: A | Discharge status: Home / Self Care | Payer: Medicaid Other | Attending: Obstetrics and Gynecology | Admitting: Obstetrics and Gynecology

## 2019-01-12 ENCOUNTER — Other Ambulatory Visit: Payer: Self-pay | Admitting: Student

## 2019-01-12 ENCOUNTER — Other Ambulatory Visit: Payer: Self-pay

## 2019-01-12 DIAGNOSIS — O133 Gestational [pregnancy-induced] hypertension without significant proteinuria, third trimester: Secondary | ICD-10-CM | POA: Diagnosis not present

## 2019-01-12 DIAGNOSIS — O10913 Unspecified pre-existing hypertension complicating pregnancy, third trimester: Secondary | ICD-10-CM

## 2019-01-12 DIAGNOSIS — Z362 Encounter for other antenatal screening follow-up: Secondary | ICD-10-CM

## 2019-01-12 DIAGNOSIS — R030 Elevated blood-pressure reading, without diagnosis of hypertension: Secondary | ICD-10-CM | POA: Diagnosis present

## 2019-01-12 DIAGNOSIS — O10013 Pre-existing essential hypertension complicating pregnancy, third trimester: Secondary | ICD-10-CM | POA: Diagnosis not present

## 2019-01-12 DIAGNOSIS — Z3A38 38 weeks gestation of pregnancy: Secondary | ICD-10-CM

## 2019-01-12 DIAGNOSIS — Z862 Personal history of diseases of the blood and blood-forming organs and certain disorders involving the immune mechanism: Secondary | ICD-10-CM | POA: Diagnosis not present

## 2019-01-12 LAB — URINALYSIS, ROUTINE W REFLEX MICROSCOPIC
Bilirubin Urine: NEGATIVE
Glucose, UA: NEGATIVE mg/dL
Hgb urine dipstick: NEGATIVE
Ketones, ur: NEGATIVE mg/dL
Nitrite: NEGATIVE
Protein, ur: NEGATIVE mg/dL
Specific Gravity, Urine: 1.011 (ref 1.005–1.030)
pH: 7 (ref 5.0–8.0)

## 2019-01-12 LAB — COMPREHENSIVE METABOLIC PANEL
ALT: 14 U/L (ref 0–44)
AST: 19 U/L (ref 15–41)
Albumin: 2.6 g/dL — ABNORMAL LOW (ref 3.5–5.0)
Alkaline Phosphatase: 123 U/L (ref 38–126)
Anion gap: 8 (ref 5–15)
BUN: 7 mg/dL (ref 6–20)
CO2: 22 mmol/L (ref 22–32)
Calcium: 8.9 mg/dL (ref 8.9–10.3)
Chloride: 105 mmol/L (ref 98–111)
Creatinine, Ser: 0.59 mg/dL (ref 0.44–1.00)
GFR calc Af Amer: >60 mL/min (ref >60)
GFR calc non Af Amer: >60 mL/min (ref >60)
Glucose, Bld: 90 mg/dL (ref 70–99)
Potassium: 3.9 mmol/L (ref 3.5–5.1)
Sodium: 135 mmol/L (ref 135–145)
Total Bilirubin: 0.4 mg/dL (ref 0.3–1.2)
Total Protein: 5.9 g/dL — ABNORMAL LOW (ref 6.5–8.1)

## 2019-01-12 LAB — CBC
HCT: 31.6 % — ABNORMAL LOW (ref 36.0–46.0)
Hemoglobin: 10.1 g/dL — ABNORMAL LOW (ref 12.0–15.0)
MCH: 23.8 pg — ABNORMAL LOW (ref 26.0–34.0)
MCHC: 32 g/dL (ref 30.0–36.0)
MCV: 74.4 fL — ABNORMAL LOW (ref 80.0–100.0)
Platelets: 356 10*3/uL (ref 150–400)
RBC: 4.25 MIL/uL (ref 3.87–5.11)
RDW: 15.3 % (ref 11.5–15.5)
WBC: 6.7 10*3/uL (ref 4.0–10.5)
nRBC: 0 % (ref 0.0–0.2)

## 2019-01-12 LAB — PROTEIN / CREATININE RATIO, URINE
Creatinine, Urine: 85.54 mg/dL
Protein Creatinine Ratio: 0.11 mg/mg{Cre} (ref 0.00–0.15)
Total Protein, Urine: 9 mg/dL

## 2019-01-12 NOTE — Discharge Instructions (Signed)
Labor Induction ° °Labor induction is when steps are taken to cause a pregnant woman to begin the labor process. Most women go into labor on their own between 37 weeks and 42 weeks of pregnancy. When this does not happen or when there is a medical need for labor to begin, steps may be taken to induce labor. Labor induction causes a pregnant woman's uterus to contract. It also causes the cervix to soften (ripen), open (dilate), and thin out (efface). Usually, labor is not induced before 39 weeks of pregnancy unless there is a medical reason to do so. Your health care provider will determine if labor induction is needed. °Before inducing labor, your health care provider will consider a number of factors, including: °· Your medical condition and your baby's. °· How many weeks along you are in your pregnancy. °· How mature your baby's lungs are. °· The condition of your cervix. °· The position of your baby. °· The size of your birth canal. °What are some reasons for labor induction? °Labor may be induced if: °· Your health or your baby's health is at risk. °· Your pregnancy is overdue by 1 week or more. °· Your water breaks but labor does not start on its own. °· There is a low amount of amniotic fluid around your baby. °You may also choose (elect) to have labor induced at a certain time. Generally, elective labor induction is done no earlier than 39 weeks of pregnancy. °What methods are used for labor induction? °Methods used for labor induction include: °· Prostaglandin medicine. This medicine starts contractions and causes the cervix to dilate and ripen. It can be taken by mouth (orally) or by being inserted into the vagina (suppository). °· Inserting a small, thin tube (catheter) with a balloon into the vagina and then expanding the balloon with water to dilate the cervix. °· Stripping the membranes. In this method, your health care provider gently separates amniotic sac tissue from the cervix. This causes the  cervix to stretch, which in turn causes the release of a hormone called progesterone. The hormone causes the uterus to contract. This procedure is often done during an office visit, after which you will be sent home to wait for contractions to begin. °· Breaking the water. In this method, your health care provider uses a small instrument to make a small hole in the amniotic sac. This eventually causes the amniotic sac to break. Contractions should begin after a few hours. °· Medicine to trigger or strengthen contractions. This medicine is given through an IV that is inserted into a vein in your arm. °Except for membrane stripping, which can be done in a clinic, labor induction is done in the hospital so that you and your baby can be carefully monitored. °How long does it take for labor to be induced? °The length of time it takes to induce labor depends on how ready your body is for labor. Some inductions can take up to 2-3 days, while others may take less than a day. Induction may take longer if: °· You are induced early in your pregnancy. °· It is your first pregnancy. °· Your cervix is not ready. °What are some risks associated with labor induction? °Some risks associated with labor induction include: °· Changes in fetal heart rate, such as being too high, too low, or irregular (erratic). °· Failed induction. °· Infection in the mother or the baby. °· Increased risk of having a cesarean delivery. °· Fetal death. °· Breaking off (abruption)   of the placenta from the uterus (rare).  Rupture of the uterus (very rare). When induction is needed for medical reasons, the benefits of induction generally outweigh the risks. What are some reasons for not inducing labor? Labor induction should not be done if:  Your baby does not tolerate contractions.  You have had previous surgeries on your uterus, such as a myomectomy, removal of fibroids, or a vertical scar from a previous cesarean delivery.  Your placenta lies  very low in your uterus and blocks the opening of the cervix (placenta previa).  Your baby is not in a head-down position.  The umbilical cord drops down into the birth canal in front of the baby.  There are unusual circumstances, such as the baby being very early (premature).  You have had more than 2 previous cesarean deliveries. Summary  Labor induction is when steps are taken to cause a pregnant woman to begin the labor process.  Labor induction causes a pregnant woman's uterus to contract. It also causes the cervix to ripen, dilate, and efface.  Labor is not induced before 39 weeks of pregnancy unless there is a medical reason to do so.  When induction is needed for medical reasons, the benefits of induction generally outweigh the risks. This information is not intended to replace advice given to you by your health care provider. Make sure you discuss any questions you have with your health care provider. Document Released: 09/14/2006 Document Revised: 04/28/2017 Document Reviewed: 06/08/2016 Elsevier Patient Education  2020 [employer] Hypertension During Pregnancy High blood pressure (hypertension) is when the force of blood pumping through the arteries is too strong. Arteries are blood vessels that carry blood from the heart throughout the body. Hypertension during pregnancy can be mild or severe. Severe hypertension during pregnancy (preeclampsia) is a medical emergency that requires prompt evaluation and treatment. Different types of hypertension can happen during pregnancy. These include:  Chronic hypertension. This happens when you had high blood pressure before you became pregnant, and it continues during the pregnancy. Hypertension that develops before you are [redacted] weeks pregnant and continues during the pregnancy is also called chronic hypertension. If you have chronic hypertension, it will not go away after you have your baby. You will need follow-up visits with your health  care provider after you have your baby. Your doctor may want you to keep taking medicine for your blood pressure.  Gestational hypertension. This is hypertension that develops after the 20th week of pregnancy. Gestational hypertension usually goes away after you have your baby, but your health care provider will need to monitor your blood pressure to make sure that it is getting better.  Preeclampsia. This is severe hypertension during pregnancy. This can cause serious complications for you and your baby and can also cause complications for you after the delivery of your baby.  Postpartum preeclampsia. You may develop severe hypertension after giving birth. This usually occurs within 48 hours after childbirth but may occur up to 6 weeks after giving birth. This is rare. How does this affect me? Women who have hypertension during pregnancy have a greater chance of developing hypertension later in life or during future pregnancies. In some cases, hypertension during pregnancy can cause serious complications, such as:  Stroke.  Heart attack.  Injury to other organs, such as kidneys, lungs, or liver.  Preeclampsia.  Convulsions or seizures.  Placental abruption. How does this affect my baby? Hypertension during pregnancy can affect your baby. Your baby may:  Be born early (  prematurely).  Not weigh as much as he or she should at birth (low birth weight).  Not tolerate labor well, leading to an unplanned cesarean delivery. What are the risks? There are certain factors that make it more likely for you to develop hypertension during pregnancy. These include:  Having hypertension during a previous pregnancy.  Being overweight.  Being age 28 or older.  Being pregnant for the first time.  Being pregnant with more than one baby.  Becoming pregnant using fertilization methods, such as IVF (in vitro fertilization).  Having other medical problems, such as diabetes, kidney disease, or  lupus.  Having a family history of hypertension. What can I do to lower my risk? The exact cause of hypertension during pregnancy is not known. You may be able to lower your risk by:  Maintaining a healthy weight.  Eating a healthy and balanced diet.  Following your health care provider's instructions about treating any long-term conditions that you had before becoming pregnant. It is very important to keep all of your prenatal care appointments. Your health care provider will check your blood pressure and make sure that your pregnancy is progressing as expected. If a problem is found, early treatment can prevent complications. How is this treated? Treatment for hypertension during pregnancy varies depending on the type of hypertension you have and how serious it is.  If you were taking medicine for high blood pressure before you became pregnant, talk with your health care provider. You may need to change medicine during pregnancy because some medicines, like ACE inhibitors, may not be considered safe for your baby.  If you have gestational hypertension, your health care provider may order medicine to treat this during pregnancy.  If you are at risk for preeclampsia, your health care provider may recommend that you take a low-dose aspirin during your pregnancy.  If you have severe hypertension, you may need to be hospitalized so you and your baby can be monitored closely. You may also need to be given medicine to lower your blood pressure. This medicine may be given by mouth or through an IV.  In some cases, if your condition gets worse, you may need to deliver your baby early. Follow these instructions at home: Eating and drinking   Drink enough fluid to keep your urine pale yellow.  Avoid caffeine. Lifestyle  Do not use any products that contain nicotine or tobacco, such as cigarettes, e-cigarettes, and chewing tobacco. If you need help quitting, ask your health care  provider.  Do not use alcohol or drugs.  Avoid stress as much as possible.  Rest and get plenty of sleep.  Regular exercise can help to reduce your blood pressure. Ask your health care provider what kinds of exercise are best for you. General instructions  Take over-the-counter and prescription medicines only as told by your health care provider.  Keep all prenatal and follow-up visits as told by your health care provider. This is important. Contact a health care provider if:  You have symptoms that your health care provider told you may require more treatment or monitoring, such as: ? Headaches. ? Nausea or vomiting. ? Abdominal pain. ? Dizziness. ? Light-headedness. Get help right away if:  You have: ? Severe abdominal pain that does not get better with treatment. ? A severe headache that does not get better. ? Vomiting that does not get better. ? Sudden, rapid weight gain. ? Sudden swelling in your hands, ankles, or face. ? Vaginal bleeding. ? Blood in  your urine. ? Blurred or double vision. ? Shortness of breath or chest pain. ? Weakness on one side of your body. ? Difficulty speaking.  Your baby is not moving as much as usual. Summary  High blood pressure (hypertension) is when the force of blood pumping through the arteries is too strong.  Hypertension during pregnancy can cause problems for you and your baby.  Treatment for hypertension during pregnancy varies depending on the type of hypertension you have and how serious it is.  Keep all prenatal and follow-up visits as told by your health care provider. This is important. This information is not intended to replace advice given to you by your health care provider. Make sure you discuss any questions you have with your health care provider. Document Released: 01/11/2011 Document Revised: 08/16/2018 Document Reviewed: 05/22/2018 Elsevier Patient Education  2020 [employer].

## 2019-01-12 NOTE — MAU Note (Signed)
Elizabeth Fisher is a 28 y.o. at [redacted]w[redacted]d here in MAU reporting: states she was sent in for BP evaluation. States at home her BP was 125/93. States at her last apt she was seeing floaters in her vision but that went away. No headaches. No epigastric pain. +FM  Pain score: 0/10  Vitals:   01/12/19 1049  BP: 128/86  Pulse: 90  Resp: 16  SpO2: 100%     FHT: +FM  Lab orders placed from triage: UA

## 2019-01-12 NOTE — MAU Provider Note (Signed)
Chief Complaint  Patient presents with  . Hypertension     First Provider Initiated Contact with Patient 01/12/19 1125      S: Elizabeth Fisher  is a 28 y.o. y.o. year old 222P0010 female at 2450w3d weeks gestation who presents to MAU with elevated blood pressures. States at home her BP was 120s/90s. Denies  Hx of hypertension. Current blood pressure medication: none  Associated symptoms: denies Headache, denies vision changes, denies epigastric pain Contractions: denies Vaginal bleeding: denies Fetal movement: normal  O:  Patient Vitals for the past 24 hrs:  BP Temp src Pulse Resp SpO2 Height Weight  01/12/19 1432 121/81 - 77 16 - - -  01/12/19 1145 125/86 - 85 - - - -  01/12/19 1130 121/84 - 81 - - - -  01/12/19 1115 125/83 - 86 - - - -  01/12/19 1113 126/89 - 79 - - - -  01/12/19 1049 128/86 Oral 90 16 100 % - -  01/12/19 1046 - - - - - 5\' 4"  (1.626 m) 59.3 kg   General: NAD Heart: Regular rate Lungs: Normal rate and effort Abd: Soft, NT, Gravid, S=D Extremities: no Pedal edema Neuro: 2+ deep tendon reflexes, No clonus   NST:  Baseline: 150 bpm, Variability: Good {> 6 bpm), Accelerations: Reactive and Decelerations: Absent  Results for orders placed or performed during the hospital encounter of 01/12/19 (from the past 24 hour(s))  Urinalysis, Routine w reflex microscopic     Status: Abnormal   Collection Time: 01/12/19 11:24 AM  Result Value Ref Range   Color, Urine YELLOW YELLOW   APPearance CLEAR CLEAR   Specific Gravity, Urine 1.011 1.005 - 1.030   pH 7.0 5.0 - 8.0   Glucose, UA NEGATIVE NEGATIVE mg/dL   Hgb urine dipstick NEGATIVE NEGATIVE   Bilirubin Urine NEGATIVE NEGATIVE   Ketones, ur NEGATIVE NEGATIVE mg/dL   Protein, ur NEGATIVE NEGATIVE mg/dL   Nitrite NEGATIVE NEGATIVE   Leukocytes,Ua SMALL (A) NEGATIVE   RBC / HPF 0-5 0 - 5 RBC/hpf   WBC, UA 6-10 0 - 5 WBC/hpf   Bacteria, UA RARE (A) NONE SEEN   Squamous Epithelial / LPF 0-5 0 - 5   Mucus PRESENT    Protein / creatinine ratio, urine     Status: None   Collection Time: 01/12/19 11:24 AM  Result Value Ref Range   Creatinine, Urine 85.54 mg/dL   Total Protein, Urine 9 mg/dL   Protein Creatinine Ratio 0.11 0.00 - 0.15 mg/mg[Cre]  CBC     Status: Abnormal   Collection Time: 01/12/19 11:35 AM  Result Value Ref Range   WBC 6.7 4.0 - 10.5 K/uL   RBC 4.25 3.87 - 5.11 MIL/uL   Hemoglobin 10.1 (L) 12.0 - 15.0 g/dL   HCT 16.131.6 (L) 09.636.0 - 04.546.0 %   MCV 74.4 (L) 80.0 - 100.0 fL   MCH 23.8 (L) 26.0 - 34.0 pg   MCHC 32.0 30.0 - 36.0 g/dL   RDW 40.915.3 81.111.5 - 91.415.5 %   Platelets 356 150 - 400 K/uL   nRBC 0.0 0.0 - 0.2 %  Comprehensive metabolic panel     Status: Abnormal   Collection Time: 01/12/19 11:35 AM  Result Value Ref Range   Sodium 135 135 - 145 mmol/L   Potassium 3.9 3.5 - 5.1 mmol/L   Chloride 105 98 - 111 mmol/L   CO2 22 22 - 32 mmol/L   Glucose, Bld 90 70 - 99 mg/dL  BUN 7 6 - 20 mg/dL   Creatinine, Ser 0.59 0.44 - 1.00 mg/dL   Calcium 8.9 8.9 - 10.3 mg/dL   Total Protein 5.9 (L) 6.5 - 8.1 g/dL   Albumin 2.6 (L) 3.5 - 5.0 g/dL   AST 19 15 - 41 U/L   ALT 14 0 - 44 U/L   Alkaline Phosphatase 123 38 - 126 U/L   Total Bilirubin 0.4 0.3 - 1.2 mg/dL   GFR calc non Af Amer >60 >60 mL/min   GFR calc Af Amer >60 >60 mL/min   Anion gap 8 5 - 15    A: [redacted]w[redacted]d week IUP Chronic hypertension -pt normotensive in MAU, no symptoms, PEC labs normal -per review of records, patient with elevated BPs during the first half of pregnancy & documented elevated prior to pregnancy, will treat as chronic hypertension -BPP & growth ultrasound completed today in MAU   P: Discharge home in stable condition per consult with Dr. Ilda Basset Preeclampsia precautions. Scheduled for 39 wk IOL Return to maternity admissions as needed in emergencies  Jorje Guild, NP 01/12/2019 3:28 PM

## 2019-01-12 NOTE — Telephone Encounter (Signed)
Called patient to have her go to MAU for BP check and PEC labs. NO answer on her phone or her mother's phone.   Laury Deep, CNM

## 2019-01-13 ENCOUNTER — Other Ambulatory Visit: Payer: Self-pay | Admitting: Family Medicine

## 2019-01-14 ENCOUNTER — Other Ambulatory Visit (HOSPITAL_COMMUNITY): Payer: Self-pay | Admitting: Advanced Practice Midwife

## 2019-01-14 ENCOUNTER — Other Ambulatory Visit: Payer: Self-pay | Admitting: Advanced Practice Midwife

## 2019-01-15 ENCOUNTER — Other Ambulatory Visit (HOSPITAL_COMMUNITY)
Admission: RE | Admit: 2019-01-15 | Discharge: 2019-01-15 | Disposition: A | Discharge status: Home / Self Care | Payer: Medicaid Other | Source: Ambulatory Visit | Attending: Obstetrics and Gynecology | Admitting: Obstetrics and Gynecology

## 2019-01-15 ENCOUNTER — Other Ambulatory Visit: Payer: Self-pay

## 2019-01-15 ENCOUNTER — Telehealth (HOSPITAL_COMMUNITY): Payer: Self-pay | Admitting: *Deleted

## 2019-01-15 DIAGNOSIS — Z01812 Encounter for preprocedural laboratory examination: Secondary | ICD-10-CM | POA: Insufficient documentation

## 2019-01-15 DIAGNOSIS — Z20828 Contact with and (suspected) exposure to other viral communicable diseases: Secondary | ICD-10-CM | POA: Diagnosis not present

## 2019-01-15 LAB — SARS CORONAVIRUS 2 BY RT PCR (HOSPITAL ORDER, PERFORMED IN ~~LOC~~ HOSPITAL LAB): SARS Coronavirus 2: NEGATIVE

## 2019-01-15 NOTE — Telephone Encounter (Signed)
Preadmission screen staes she will come by when she is finished with what she is doing now.

## 2019-01-15 NOTE — MAU Note (Signed)
Asymptomatic, swab collected. 

## 2019-01-17 ENCOUNTER — Other Ambulatory Visit: Payer: Self-pay

## 2019-01-17 ENCOUNTER — Encounter: Payer: Medicaid Other | Admitting: Obstetrics and Gynecology

## 2019-01-17 ENCOUNTER — Inpatient Hospital Stay (HOSPITAL_COMMUNITY): Payer: Medicaid Other

## 2019-01-17 ENCOUNTER — Inpatient Hospital Stay (HOSPITAL_COMMUNITY)
Admission: RE | Admit: 2019-01-17 | Discharge: 2019-01-19 | DRG: 807 | Disposition: A | Discharge status: Home / Self Care | Payer: Medicaid Other | Attending: Obstetrics and Gynecology | Admitting: Obstetrics and Gynecology

## 2019-01-17 ENCOUNTER — Encounter (HOSPITAL_COMMUNITY): Payer: Self-pay

## 2019-01-17 DIAGNOSIS — O10913 Unspecified pre-existing hypertension complicating pregnancy, third trimester: Secondary | ICD-10-CM | POA: Diagnosis present

## 2019-01-17 DIAGNOSIS — O1002 Pre-existing essential hypertension complicating childbirth: Principal | ICD-10-CM | POA: Diagnosis present

## 2019-01-17 DIAGNOSIS — D573 Sickle-cell trait: Secondary | ICD-10-CM | POA: Diagnosis present

## 2019-01-17 DIAGNOSIS — Z3A39 39 weeks gestation of pregnancy: Secondary | ICD-10-CM

## 2019-01-17 DIAGNOSIS — Z3490 Encounter for supervision of normal pregnancy, unspecified, unspecified trimester: Secondary | ICD-10-CM | POA: Diagnosis present

## 2019-01-17 DIAGNOSIS — O9902 Anemia complicating childbirth: Secondary | ICD-10-CM | POA: Diagnosis present

## 2019-01-17 HISTORY — DX: Essential (primary) hypertension: I10

## 2019-01-17 LAB — COMPREHENSIVE METABOLIC PANEL
ALT: 18 U/L (ref 0–44)
AST: 34 U/L (ref 15–41)
Albumin: 2.8 g/dL — ABNORMAL LOW (ref 3.5–5.0)
Alkaline Phosphatase: 150 U/L — ABNORMAL HIGH (ref 38–126)
Anion gap: 8 (ref 5–15)
BUN: 6 mg/dL (ref 6–20)
CO2: 20 mmol/L — ABNORMAL LOW (ref 22–32)
Calcium: 8.9 mg/dL (ref 8.9–10.3)
Chloride: 106 mmol/L (ref 98–111)
Creatinine, Ser: 0.61 mg/dL (ref 0.44–1.00)
GFR calc Af Amer: >60 mL/min (ref >60)
GFR calc non Af Amer: >60 mL/min (ref >60)
Glucose, Bld: 77 mg/dL (ref 70–99)
Potassium: 3.8 mmol/L (ref 3.5–5.1)
Sodium: 134 mmol/L — ABNORMAL LOW (ref 135–145)
Total Bilirubin: 0.3 mg/dL (ref 0.3–1.2)
Total Protein: 6.3 g/dL — ABNORMAL LOW (ref 6.5–8.1)

## 2019-01-17 LAB — CBC
HCT: 31.7 % — ABNORMAL LOW (ref 36.0–46.0)
Hemoglobin: 10.2 g/dL — ABNORMAL LOW (ref 12.0–15.0)
MCH: 23.5 pg — ABNORMAL LOW (ref 26.0–34.0)
MCHC: 32.2 g/dL (ref 30.0–36.0)
MCV: 73 fL — ABNORMAL LOW (ref 80.0–100.0)
Platelets: 348 10*3/uL (ref 150–400)
RBC: 4.34 MIL/uL (ref 3.87–5.11)
RDW: 15.4 % (ref 11.5–15.5)
WBC: 6.2 10*3/uL (ref 4.0–10.5)
nRBC: 0 % (ref 0.0–0.2)

## 2019-01-17 LAB — TYPE AND SCREEN
ABO/RH(D): AB POS
Antibody Screen: NEGATIVE

## 2019-01-17 LAB — PROTEIN / CREATININE RATIO, URINE
Creatinine, Urine: 46.07 mg/dL
Total Protein, Urine: <3 mg/dL

## 2019-01-17 LAB — RPR: RPR Ser Ql: NONREACTIVE

## 2019-01-17 MED ORDER — FENTANYL CITRATE (PF) 100 MCG/2ML IJ SOLN
50.0000 ug | Freq: Once | INTRAMUSCULAR | Status: AC
  Administered 2019-01-17: 50 ug via INTRAVENOUS

## 2019-01-17 MED ORDER — MISOPROSTOL 25 MCG QUARTER TABLET
25.0000 ug | ORAL_TABLET | ORAL | Status: DC | PRN
  Administered 2019-01-17 (×2): 25 ug via VAGINAL
  Filled 2019-01-17 (×3): qty 1

## 2019-01-17 MED ORDER — ACETAMINOPHEN 325 MG PO TABS: 650.0000 mg | ORAL_TABLET | ORAL | Status: DC | PRN

## 2019-01-17 MED ORDER — OXYTOCIN 40 UNITS IN NORMAL SALINE INFUSION - SIMPLE MED
2.5000 [IU]/h | INTRAVENOUS | Status: DC
  Filled 2019-01-17: qty 1000

## 2019-01-17 MED ORDER — LACTATED RINGERS IV SOLN
INTRAVENOUS | Status: DC
  Administered 2019-01-17 (×2): via INTRAVENOUS

## 2019-01-17 MED ORDER — IBUPROFEN 600 MG PO TABS
600.0000 mg | ORAL_TABLET | Freq: Four times a day (QID) | ORAL | Status: DC
  Administered 2019-01-17 – 2019-01-19 (×7): 600 mg via ORAL
  Filled 2019-01-17 (×8): qty 1

## 2019-01-17 MED ORDER — OXYCODONE-ACETAMINOPHEN 5-325 MG PO TABS: 1.0000 | ORAL_TABLET | ORAL | Status: DC | PRN

## 2019-01-17 MED ORDER — SENNOSIDES-DOCUSATE SODIUM 8.6-50 MG PO TABS
2.0000 | ORAL_TABLET | ORAL | Status: DC
  Administered 2019-01-17 – 2019-01-18 (×2): 2 via ORAL
  Filled 2019-01-17 (×2): qty 2

## 2019-01-17 MED ORDER — SOD CITRATE-CITRIC ACID 500-334 MG/5ML PO SOLN: 30.0000 mL | ORAL | Status: DC | PRN

## 2019-01-17 MED ORDER — OXYTOCIN BOLUS FROM INFUSION
500.0000 mL | Freq: Once | INTRAVENOUS | Status: AC
  Administered 2019-01-17: 14:00:00 500 mL via INTRAVENOUS

## 2019-01-17 MED ORDER — COCONUT OIL OIL
1.0000 "application " | TOPICAL_OIL | Status: DC | PRN
  Administered 2019-01-18: 1 via TOPICAL

## 2019-01-17 MED ORDER — LIDOCAINE HCL (PF) 1 % IJ SOLN
30.0000 mL | INTRAMUSCULAR | Status: AC | PRN
  Administered 2019-01-17: 30 mL via SUBCUTANEOUS
  Filled 2019-01-17: qty 30

## 2019-01-17 MED ORDER — OXYCODONE-ACETAMINOPHEN 5-325 MG PO TABS: 2.0000 | ORAL_TABLET | ORAL | Status: DC | PRN

## 2019-01-17 MED ORDER — LACTATED RINGERS IV SOLN: 500.0000 mL | INTRAVENOUS | Status: DC | PRN

## 2019-01-17 MED ORDER — ONDANSETRON HCL 4 MG/2ML IJ SOLN
4.0000 mg | Freq: Four times a day (QID) | INTRAMUSCULAR | Status: DC | PRN
  Administered 2019-01-17: 06:00:00 4 mg via INTRAVENOUS
  Filled 2019-01-17: qty 2

## 2019-01-17 MED ORDER — FLEET ENEMA 7-19 GM/118ML RE ENEM: 1.0000 | ENEMA | RECTAL | Status: DC | PRN

## 2019-01-17 MED ORDER — ONDANSETRON HCL 4 MG/2ML IJ SOLN: 4.0000 mg | INTRAMUSCULAR | Status: DC | PRN

## 2019-01-17 MED ORDER — OXYTOCIN 40 UNITS IN NORMAL SALINE INFUSION - SIMPLE MED
1.0000 m[IU]/min | INTRAVENOUS | Status: DC
  Administered 2019-01-17: 2 m[IU]/min via INTRAVENOUS

## 2019-01-17 MED ORDER — PRENATAL MULTIVITAMIN CH
1.0000 | ORAL_TABLET | Freq: Every day | ORAL | Status: DC
  Administered 2019-01-18 – 2019-01-19 (×2): 1 via ORAL
  Filled 2019-01-17 (×2): qty 1

## 2019-01-17 MED ORDER — ONDANSETRON HCL 4 MG PO TABS: 4.0000 mg | ORAL_TABLET | ORAL | Status: DC | PRN

## 2019-01-17 MED ORDER — SIMETHICONE 80 MG PO CHEW: 80.0000 mg | CHEWABLE_TABLET | ORAL | Status: DC | PRN

## 2019-01-17 MED ORDER — TETANUS-DIPHTH-ACELL PERTUSSIS 5-2.5-18.5 LF-MCG/0.5 IM SUSP: 0.5000 mL | Freq: Once | INTRAMUSCULAR | Status: DC

## 2019-01-17 MED ORDER — FENTANYL CITRATE (PF) 100 MCG/2ML IJ SOLN: 100.0000 ug | Freq: Once | INTRAMUSCULAR | Status: DC

## 2019-01-17 MED ORDER — BENZOCAINE-MENTHOL 20-0.5 % EX AERO
1.0000 "application " | INHALATION_SPRAY | CUTANEOUS | Status: DC | PRN
  Administered 2019-01-18: 1 via TOPICAL
  Filled 2019-01-17: qty 56

## 2019-01-17 MED ORDER — MEDROXYPROGESTERONE ACETATE 150 MG/ML IM SUSP: 150.0000 mg | INTRAMUSCULAR | Status: DC | PRN

## 2019-01-17 MED ORDER — WITCH HAZEL-GLYCERIN EX PADS: 1.0000 "application " | MEDICATED_PAD | CUTANEOUS | Status: DC | PRN

## 2019-01-17 MED ORDER — FENTANYL CITRATE (PF) 100 MCG/2ML IJ SOLN
100.0000 ug | INTRAMUSCULAR | Status: DC | PRN
  Administered 2019-01-17 (×4): 100 ug via INTRAVENOUS
  Filled 2019-01-17 (×5): qty 2

## 2019-01-17 MED ORDER — ZOLPIDEM TARTRATE 5 MG PO TABS: 5.0000 mg | ORAL_TABLET | Freq: Every evening | ORAL | Status: DC | PRN

## 2019-01-17 MED ORDER — TERBUTALINE SULFATE 1 MG/ML IJ SOLN: 0.2500 mg | Freq: Once | INTRAMUSCULAR | Status: DC | PRN

## 2019-01-17 MED ORDER — DIBUCAINE (PERIANAL) 1 % EX OINT: 1.0000 "application " | TOPICAL_OINTMENT | CUTANEOUS | Status: DC | PRN

## 2019-01-17 MED ORDER — DIPHENHYDRAMINE HCL 25 MG PO CAPS: 25.0000 mg | ORAL_CAPSULE | Freq: Four times a day (QID) | ORAL | Status: DC | PRN

## 2019-01-17 NOTE — Progress Notes (Signed)
LABOR PROGRESS NOTE  Elizabeth Fisher is a 28 y.o. G2P0010 at 104w1d  admitted for IOL for [facility]  Subjective: Patient doing well, reports occasional cramping   Objective: BP 109/80   Pulse 78   Temp 98 F (36.7 C) (Oral)   Resp 16   Ht 5\' 4"  (1.626 m)   Wt 58.4 kg   LMP 04/18/2018 (Exact Date)   SpO2 100%   BMI 22.11 kg/m  or  Vitals:   01/17/19 0200 01/17/19 0301 01/17/19 0402 01/17/19 0414  BP: 116/78 139/90 120/84 109/80  Pulse: 79 79 75 78  Resp: 16     Temp:      TempSrc:      SpO2:      Weight:      Height:        FB placed @ 0550 by CNM  Dilation: 2 Effacement (%): 50 Cervical Position: Posterior Station: -1 Presentation: Vertex Exam by:: Wende Bushy CNM FHT: baseline rate 140, moderate varibility, +accel, no decel Toco: irregular mild contractions   Labs: Lab Results  Component Value Date   WBC 6.2 01/17/2019   HGB 10.2 (L) 01/17/2019   HCT 31.7 (L) 01/17/2019   MCV 73.0 (L) 01/17/2019   PLT 348 01/17/2019    Patient Active Problem List   Diagnosis Date Noted  . Chronic hypertension complicating or reason for care during pregnancy, third trimester 01/17/2019  . Encounter for induction of labor 01/17/2019  . Supervision of normal first pregnancy, antepartum 07/09/2018  . Transient hypertension of pregnancy 06/24/2018  . Hyperemesis gravidarum with metabolic disturbance, antepartum 06/22/2018    Assessment / Plan: 28 y.o. G2P0010 at [redacted]w[redacted]d here for IOL for [facility]   Labor: 2nd dose of cytotec and FB placed @ 0550 Fetal Wellbeing:  Cat I  Pain Control:  IV Fentanyl- patient requested after FB placement  Anticipated MOD:  SVD  Lajean Manes, CNM 01/17/2019, 6:19 AM

## 2019-01-17 NOTE — H&P (Addendum)
OBSTETRIC ADMISSION HISTORY AND PHYSICAL  Elizabeth Fisher is a 28 y.o. female G2P0010 with IUP at [redacted]w[redacted]d by LMP confirmed by early ultrasound presenting for presenting for IOL for cHTN. Pt is anxious but excited. She denies LOF, vaginal bleeding, HA, vision changes or RUQ pain. Reports good fetal movement. Feels some occasional "tightening" in abdomen, but nothing regular.    She received her prenatal care at [facility].  Support person in labor: Mother-Elizabeth Fisher  Ultrasounds Anatomy U/S: normal  Prenatal History/Complications: . cHTN  Past Medical History: Past Medical History:  Diagnosis Date  . Exercise-induced asthma   . Hypertension   . Sickle cell trait ([place])   . 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 Vomiting, unspecified     Past Surgical History: Past Surgical History:  Procedure Laterality Date  . UPPER GI ENDOSCOPY    . WISDOM TOOTH EXTRACTION      Obstetrical History: OB History    Gravida  2   Para      Term      Preterm      AB  1   Living        SAB  1   TAB      Ectopic      Multiple      Live Births              Social History: Social History   Socioeconomic History  . Marital status: Single    Spouse name: Not on file  . Number of children: Not on file  . Years of education: [employer]  . Highest education level: 12th grade  Occupational History  . Not on file  Social Needs  . Financial resource strain: Not hard at all  . Food insecurity    Worry: Never true    Inability: Never true  . Transportation needs    Medical: No    Non-medical: No  Tobacco Use  . Smoking status: Never Smoker  . Smokeless tobacco: Never Used  Substance and Sexual Activity  . Alcohol use: Not Currently  . Drug use: Not Currently    Types: Marijuana  . Sexual activity: Yes    Birth control/protection: None  Lifestyle  . Physical activity    Days per week: 0 days    Minutes per session: 0 min  . Stress: Not at all  Relationships   . Social connections    Talks on phone: More than three times a week    Gets together: Three times a week    Attends religious service: Never    Active member of club or organization: No    Attends meetings of clubs or organizations: Never    Relationship status: Never married  Other Topics Concern  . Not on file  Social History Narrative  . Not on file    Family History: Family History  Problem Relation Age of Onset  . Post-traumatic stress disorder Mother     Allergies: No Known Allergies  Medications Prior to Admission  Medication Sig Dispense Refill Last Dose  . polyethylene glycol (MIRALAX / GLYCOLAX) 17 g packet Take 17 g by mouth once.   Past Week at Unknown time  . Prenatal Vit-Fe Phos-FA-Omega (VITAFOL GUMMIES) 3.33-0.333-34.8 MG CHEW Chew 3 each by mouth daily. 90 tablet 12 01/17/2019 at Unknown time  . ondansetron (ZOFRAN ODT) 4 MG disintegrating tablet Take 1 tablet (4 mg total) by mouth every 6 (six) hours as needed for nausea or vomiting. 30 tablet 2 Unknown  at Unknown time  . pantoprazole (PROTONIX) 40 MG tablet Take 1 tablet (40 mg total) by mouth daily for 30 days. 30 tablet 7   . promethazine (PHENERGAN) 25 MG tablet Take 1 tablet (25 mg total) by mouth every 6 (six) hours for 14 days. 56 tablet 2      Review of Systems  All systems reviewed and negative except as stated in HPI  Blood pressure (!) 134/97, pulse 84, temperature 98 F (36.7 C), temperature source Oral, resp. rate 16, height 5\' 4"  (1.626 m), weight 58.4 kg, last menstrual period 04/18/2018, SpO2 100 %, unknown if currently breastfeeding. General appearance: alert, cooperative, appears stated age and no distress Lungs: no respiratory distress Heart: regular rate  Abdomen: soft, non-tender; gravid  Pelvic: adequate Extremities: Homans sign is negative, no sign of DVT Presentation: cephalic Fetal monitoring: FHR 145 Uterine activity: none Dilation: 1 Effacement (%): 50 Station: -1 Exam  by:: Elizabeth Fisher, student CNM  Prenatal labs: ABO, Rh: --/--/AB POS, AB POS Performed at [facility], [address] [place], [place] [address]  [phone](02/14 2037) Antibody: NEG (02/14 2037) Rubella: 3.95 (02/15 [id number]) RPR: Non Reactive (06/24 0848)  HBsAg: Negative (02/15 [id number])  HIV: Non Reactive (06/24 0848)  GBS: negative  Glucola: Normal Genetic screening: NIPS: low risk female, AFP: negative  Prenatal Transfer Tool  Maternal Diabetes: No Genetic Screening: Normal Maternal Ultrasounds/Referrals: Normal Fetal Ultrasounds or other Referrals:  None Maternal Substance Abuse:  No Significant Maternal Medications:  None Significant Maternal Lab Results: Group B Strep negative and Other: 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  Patient Active Problem List   Diagnosis Date Noted  . Chronic hypertension complicating or reason for care during pregnancy, third trimester 01/17/2019  . Encounter for induction of labor 01/17/2019  . Supervision of normal first pregnancy, antepartum 07/09/2018  . Transient hypertension of pregnancy 06/24/2018  . Hyperemesis gravidarum with metabolic disturbance, antepartum 06/22/2018    Assessment/Plan:  [name] is a 28 y.o. G2P0010 at 9840w1d here for IOL for cHTN.  Labor:  -- Cervix 1/50/-1, vertex -- Vaginal cytotec given -- Attempted foley bulb insertion, but unsuccessful. -- Plan to reattempt foley bulb insertion at next cervical examination  -- Pain control: planning natural, but open to options  Fetal Wellbeing:  -- EFW 2909g by ultrasound on 01/12/19 -- Cephalic by VE; sutures palpated  -- GBS negative -- Continuous fetal monitoring -- Cat 1 tracing   Postpartum Planning -- Planning to breast feed -- Depo for contraception -- TDaP on 10/31/18  Elizabeth Fisher, SNM   I confirm that I have verified the information documented in the nurse  midwife student's note and that I have also personally reperformed the history, physical exam and all medical decision making activities of this service and have verified that all service and findings are accurately documented in this student's note.   Sharyon CableRogers, Yuko Coventry C, CNM 01/17/2019 2:07 AM

## 2019-01-17 NOTE — Discharge Summary (Signed)
Postpartum Discharge Summary     Patient Name: Elizabeth Fisher DOB: 30-Dec-1990 MRN: [id number]  Date of admission: 01/17/2019 Delivering Provider: Gavin Pound   Date of discharge: 01/19/2019  Admitting diagnosis: PREG Intrauterine pregnancy: [redacted]w[redacted]d    Secondary diagnosis:  Principal Problem:   SVD (spontaneous vaginal delivery) Active Problems:   Chronic hypertension complicating or reason for care during pregnancy, third trimester   Encounter for induction of labor   Second degree perineal laceration, delivered, current hospitalization  Additional problems: none     Discharge diagnosis: Term Pregnancy Delivered                                                                                                Post partum procedures:none  Augmentation: None  Complications: None  Hospital course:  Induction of Labor With Vaginal Delivery   28y.o. yo G2P0010 at 331w1das admitted to the hospital 01/17/2019 for induction of labor.  Indication for induction: CHTN.  Patient was given cytotec, foley bulb, and pitocin before she was AROM'd.  She progressed to complete and delivered at 1333 without incident.    Membrane Rupture Time/Date: 12:06 PM ,01/17/2019   Intrapartum Procedures: Episiotomy: None [1]                                         Lacerations:  2nd degree [3];Perineal [11]  Patient had delivery of a Viable infant.  Information for the patient's newborn:  PaChristyna, Letendre0[[id number]]Delivery Method: Vag-Spont    01/17/2019  Details of delivery can be found in separate delivery note.  Patient had a routine postpartum course. Patient is discharged home 01/19/19.  Delivery time: 1:33 PM  Magnesium Sulfate received: No BMZ received: No Rhophylac:No MMR:No Transfusion:No  Physical exam  Vitals:   01/18/19 0940 01/18/19 1437 01/18/19 2148 01/19/19 0600  BP: 124/77 117/80 122/77 112/78  Pulse: 75 66 67 67  Resp: _0 Temp: 98.3 F (36.8 C) 98.5 F (36.9  C) 98.6 F (37 C) 98.4 F (36.9 C)  TempSrc: Oral Oral Oral Oral  SpO2: 100% 100% 100% 100%  Weight:      Height:       General: alert, cooperative and no distress Lochia: appropriate Uterine Fundus: firm Incision: N/A DVT Evaluation: No evidence of DVT seen on physical exam. No cords or calf tenderness. No significant calf/ankle edema. Labs: Lab Results  Component Value Date   WBC 9.5 01/18/2019   HGB 9.8 (L) 01/18/2019   HCT 30.4 (L) 01/18/2019   MCV 73.4 (L) 01/18/2019   PLT 301 01/18/2019   CMP Latest Ref Rng & Units 01/17/2019  Glucose 70 - 99 mg/dL 77  BUN 6 - 20 mg/dL 6  Creatinine 0.44 - 1.00 mg/dL 0.61  Sodium 135 - 145 mmol/L 134(L)  Potassium 3.5 - 5.1 mmol/L 3.8  Chloride 98 - 111 mmol/L 106  CO2 22 - 32 mmol/L 20(L)  Calcium 8.9 - 10.3 mg/dL 8.9  Total  Protein 6.5 - 8.1 g/dL 6.3(L)  Total Bilirubin 0.3 - 1.2 mg/dL 0.3  Alkaline Phos 38 - 126 U/L 150(H)  AST 15 - 41 U/L 34  ALT 0 - 44 U/L 18    Discharge instruction: per After Visit Summary and "Baby and Me Booklet".  After visit meds:  Allergies as of 01/19/2019   No Known Allergies     Medication List    STOP taking these medications   docusate sodium 100 MG capsule Commonly known as: COLACE   ondansetron 4 MG disintegrating tablet Commonly known as: Zofran ODT   pantoprazole 40 MG tablet Commonly known as: PROTONIX   polyethylene glycol 17 g packet Commonly known as: MIRALAX / GLYCOLAX   promethazine 25 MG tablet Commonly known as: PHENERGAN     TAKE these medications   acetaminophen 325 MG tablet Commonly known as: Tylenol Take 2 tablets (650 mg total) by mouth every 4 (four) hours as needed for up to 14 days (for pain scale < 4).   ibuprofen 600 MG tablet Commonly known as: ADVIL Take 1 tablet (600 mg total) by mouth every 6 (six) hours.   senna 8.6 MG Tabs tablet Commonly known as: SENOKOT Take 1 tablet (8.6 mg total) by mouth daily.   Vitafol Gummies 3.33-0.333-34.8 MG  Chew Chew 3 each by mouth daily.       Diet: routine diet  Activity: Advance as tolerated. Pelvic rest for 6 weeks.   Outpatient follow up:6 weeks Follow up Appt: Future Appointments  Date Time Provider [place]  01/22/2019  9:50 AM [place] None  02/28/2019  9:10 AM Laury Deep, CNM [facility] None  04/08/2019 10:10 AM [place] None   Follow up Visit: Follow-up Information    [facility] Follow up in 1 week(s).   Specialty: Obstetrics and Gynecology Why: BP check and 6 weeks post-partum Contact information: [place] [phone]           Please schedule this patient for Postpartum visit in: 1 week for BP Check with Nurse and 6 week PP visit with the following provider: Any provider For C/S patients schedule nurse incision check in weeks 2 weeks: N/A High risk pregnancy complicated by: HTN Delivery mode:  SVD Anticipated Birth Control:  POPs PP Procedures needed: BP check  Schedule Integrated [place] visit: no  **Follow Up Request Sent** JE, CNM    Newborn Data: Live born female-Stone[place] Birth Weight:   APGAR: 63, 9  Newborn Delivery   Birth date/time: 01/17/2019 13:33:00 Delivery type: Vaginal, Spontaneous      Baby Feeding: Breast Disposition:home with mother   01/19/2019 Clarisa Fling, NP

## 2019-01-17 NOTE — Progress Notes (Signed)
Received care of patient from [place] @0624 

## 2019-01-17 NOTE — Progress Notes (Signed)
Elizabeth Fisher MRN: [id number]  Subjective: -Care assumed of 28 y.o. G2P0010 at [redacted]w[redacted]d who presents for IOL s/t CHTN. Pregnancy and medical history significant for [place] trait and Asthma-exercise induced.  In room to meet acquaintance of patient who reports some anxiety, but is overall doing well.  Patient endorses contractions and fetal movement, while denying HA, visual disturbances, LOF, and VB.  She reports a desire to avoid an Epidural for pain mgmt.    Objective: BP 126/84   Pulse 89   Temp 97.7 F (36.5 C) (Oral)   Resp 18   Ht 5\' 4"  (1.626 m)   Wt 58.4 kg   LMP 04/18/2018 (Exact Date)   SpO2 98%   BMI 22.11 kg/m  No intake/output data recorded. No intake/output data recorded.  Fetal Monitoring: FHT: 135 bpm, Mod Var, +Prolonged Decels x 2, +Accels UC: Q3-62min, palpates mild to moderate    Vaginal Exam: SVE:   Dilation: 5 Effacement (%): 60 Station: -1 Exam by:: Delorise Shiner, RN Membranes:Intact Internal Monitors: None  Augmentation/Induction: Pitocin:Initiated Cytotec: S/P   Assessment:  IUP at 39.1 weeks Cat II FT  CHTN IOL Bishop Score: 10   Plan: -Resolution of Cat II FT with position change and fluid bolus.  -Discussed AROM r/b including increased risk of infection, cord prolapse, fetal intolerance, and decreased labor time.  -Questions and concerns addressed.  -Patient requests to delay AROM at current citing anxiety and desire to labor without epidural.  -Informed that strong recommendation would be to proceed with AROM and FSE placement if fetal distress again noted. -Discussed r/b of fetal scalp electrode including infection, trauma to fetal scalp, and ability to monitor fetal heart rate more accurately.   -Will start pitocin at 71mUn/min -Continue other mgmt as ordered  Riley Churches, CNM 01/17/2019, 9:44 AM

## 2019-01-17 NOTE — Lactation Note (Signed)
This note was copied from a baby's chart. Lactation Consultation Note  Patient Name: Girl Allegra Cerniglia RKSYS'D Date: 01/17/2019 Reason for consult: Initial assessment;1st time breastfeeding;Primapara;Term  4 hours old FT female who is being exclusively BF by her mother, she's a P1. Mom reported moderate breast changes during the pregnancy, she said her nipples/areolas got bigger and darker but she her breast got actually smaller. This might have been a change in perception since she also reported her breast might look "smaller" because her belly was "bigger". She participated in the [facility] program at the [facility] but she wasn't familiar with hand expression.  LC showed mom how to hand express, she was able to do teach back and got small droplets of colostrum out of her right breast, praised her for her efforts. She has a Medela DEBP at home, but doesn't have the kit, [place] advised her to look up online before requesting a DEBP from the hospital since our kit may or may not work with her pump at home.   Baby asleep and swaddled next to mom when entering the room, noticed that baby was spitty and not ready to feed at this time, but asked mom to call for assistance when baby is cuing and ready to take the breast. Reviewed normal newborn behavior, cluster feeding, feeding cues and benefits of STS, LC placed baby STS to mom's breast and covered her with her blanket, baby still asleep when exiting the room.  Feeding plan:  1. Encouraged mom to feed baby STS 8-12 times/24 hours or sooner if feeding cues are present 2. Hand expression and spoon/finger feeding were also encouraged  BF brochure, BF resources and feeding diary were reviewed. Mom reported all questions and concerns were answered, she's aware of [place] OP services and will call PRN.   Maternal Data Formula Feeding for Exclusion: No Has patient been taught Hand Expression?: Yes Does the patient have breastfeeding experience prior to this delivery?:  No  Feeding Feeding Type: Breast Fed  LATCH Score Latch: Repeated attempts needed to sustain latch, nipple held in mouth throughout feeding, stimulation needed to elicit sucking reflex.  Audible Swallowing: None  Type of Nipple: Everted at rest and after stimulation  Comfort (Breast/Nipple): Soft / non-tender  Hold (Positioning): No assistance needed to correctly position infant at breast.  LATCH Score: 7  Interventions Interventions: Breast feeding basics reviewed;Skin to skin;Hand express;Breast massage;Breast compression  Lactation Tools Discussed/Used WIC Program: Yes   Consult Status Consult Status: Follow-up Date: 01/18/19 Follow-up type: In-patient    Randel Hargens [name] 01/17/2019, 6:03 PM

## 2019-01-17 NOTE — Progress Notes (Signed)
Elizabeth Fisher MRN: [id number]  Subjective: -Nurse reports patient with prolonged deceleration.  Provider to bedside.  Patient reports discomfort with contractions.  Mother at bedside, supportive.   Objective: BP 117/73   Pulse 81   Temp 97.7 F (36.5 C) (Oral)   Resp 16   Ht 5\' 4"  (1.626 m)   Wt 58.4 kg   LMP 04/18/2018 (Exact Date)   SpO2 98%   BMI 22.11 kg/m  No intake/output data recorded. No intake/output data recorded.  Fetal Monitoring: FHT: 135 bpm, Mod Var, -Decels, +Accels UC: Q1-74min, palpates moderate    Vaginal Exam: SVE:   Dilation: 6.5 Effacement (%): 60 Station: -1 Exam by:: Gavin Pound, CNM Membranes: AROM with large amt clear fluid Internal Monitors: FSE Placed  Augmentation/Induction: Pitocin:60mUn/min Cytotec: S/P  Assessment:  IUP at 39.1 weeks Cat II FT Amniotomy   Plan: -Resolution of Cat II FT with position change. -AROM performed without incident.  Ctx now palpate strong. -Patient given suggestions/techniques for coping with contractions. -Will continue to monitor.  -Continue other mgmt as ordered  Riley Churches, CNM 01/17/2019, 12:18 PM

## 2019-01-18 LAB — CBC
HCT: 30.4 % — ABNORMAL LOW (ref 36.0–46.0)
Hemoglobin: 9.8 g/dL — ABNORMAL LOW (ref 12.0–15.0)
MCH: 23.7 pg — ABNORMAL LOW (ref 26.0–34.0)
MCHC: 32.2 g/dL (ref 30.0–36.0)
MCV: 73.4 fL — ABNORMAL LOW (ref 80.0–100.0)
Platelets: 301 10*3/uL (ref 150–400)
RBC: 4.14 MIL/uL (ref 3.87–5.11)
RDW: 15.5 % (ref 11.5–15.5)
WBC: 9.5 10*3/uL (ref 4.0–10.5)
nRBC: 0 % (ref 0.0–0.2)

## 2019-01-18 NOTE — Progress Notes (Signed)
Post Partum Day 1 Subjective:  Patient was examined in the presence of her mother.  Elizabeth Fisher is doing well overall following her delivery. Her only complaint comes from increased abdominal pressure at the site of her laceration when she sits up in bed or ambulates. Her pain seems to be well managed with tylenol and ibuprofen. She is unsure if the laceration site is bleeding due to her red tinged lochia and has not noticed any clots. She has not had food since the time of her delivery since the cafeteria was closed but ordered food this morning. Patient endorses normal voiding. Has yet to have a bowel movement and was given Senokot to help.    She denies light headedness, headache, chest pain, shortness of breath or new leg swelling.  Plans to have a depo injection for contraception prior to discharge. She is having issues breast feeding but will continue to work with a lactation specialist throughout the day.  Objective: Blood pressure 122/84, pulse 66, temperature 98.2 F (36.8 C), temperature source Oral, resp. rate 16, height 5\' 4"  (1.626 m), weight 58.4 kg, last menstrual period 04/18/2018, SpO2 98 %, unknown if currently breastfeeding.  Physical Exam:  General: alert, cooperative, appears stated age and no distress Lochia: appropriate, red tinged  Cardio: RRR, normal s1, s2 Pulm: Normal work of breathing, CTAB, no wheezes or rhonci Abd: Soft abdomen, no tenderness to light palpation Uterine Fundus: firm DVT Evaluation: No evidence of DVT seen on physical exam. No significant calf/ankle edema.  Recent Labs    01/17/19 0015 01/18/19 0519  HGB 10.2* 9.8*  HCT 31.7* 30.4*     Assessment/Plan:  Elizabeth Fisher is a 28 y.o G2P1011 who had a SVD w/2nd degree laceration after IOL due to [facility]. Her blood pressure is within normal limits this morning.  - Plan for discharge tomorrow - Continue breastfeeding; continue to work with lactation specialist  - Depo shot for contraception pending     LOS: 1 day   Elizabeth Fisher 01/18/2019, 7:51 AM   I confirm that I have verified the information documented in the medical student's note and that I have also personally reperformed the history, physical exam and all medical decision making activities of this service and have verified that all service and findings are accurately documented in this student's note.    Elizabeth Fisher, CNM 01/18/2019 12:51 PM

## 2019-01-18 NOTE — Lactation Note (Signed)
This note was copied from a baby's chart. Lactation Consultation Note  Patient Name: Elizabeth Fisher XWNPI'O Date: 01/18/2019 Reason for consult: Follow-up assessment;Primapara;1st time breastfeeding;Term;Infant < 6lbs;Infant weight loss  80 hours old FT female who is now being partially BF and formula fed by her mother, she's a P1. RN Elizabeth Fisher reached out to [facility] after baby turned 33 hours old because she had dropped < 6 lbs and mom hasn't been getting enough volume to meet supplementation guidelines for babies < 6 lbs; baby is currently at 6% weight loss.  Mom has been pumping every 2-3 hours but only getting drops, and she feels that's not enough to keep baby content, baby is started to fall asleep at the breast, mom has had more luck with hand expression than pumping so far, but only got drops, less than a spoon.  Offered assistance with latch but mom at this point felt like she just wanted to feed her baby "something else", LC agreed to start supplementation with Gerber gentle due to baby's current weight and baby's behavior at the breast. LC showed mom and Elizabeth Fisher how to use the curved tip syringe, and baby took 12 ml. Elizabeth Fisher left a slow flow nipple in the room in case mom needs a back up method, she's aware that she needs to discard formula after an hour of being opened.  Stressed to mom the importance of pumping every time baby is getting a bottle to protect her supply and assured her that if she keeps up with pumping and putting baby to the breast this most likely will be just something temporary until her milk comes in. Both mom and grandma on board with feeding plan.  Reviewed normal newborn behavior after the 24 hours mark, feeding cues, cluster feeding, formula supplementation guidelines according to baby's age in hours and how to convert our DEBP kit into a second kit for her Medela DEBP at home, mom wanted to know if that could work out.  Feeding plan:  1. Encouraged mom to keep putting baby  to breast 8-12 times/24 hours or sooner if feeding cues are present 2. Mom will pump after feedings especially if giving baby some formula 3. Mom will continue supplementing with Gerber Gentle for the next 24 hours or until her milk comes in according to formula supplementation guidelines per baby's age in hours, feeding plan will be reassessed prior discharge  Mom and Elizabeth Fisher reported all questions and concerns were answered, they're both aware of [place] OP services and will call PRN.  Maternal Data    Feeding Feeding Type: Formula  LATCH Score                   Interventions Interventions: Breast feeding basics reviewed;DEBP  Lactation Tools Discussed/Used Tools: Pump Breast pump type: Double-Electric Breast Pump Pump Review: Setup, frequency, and cleaning Initiated by:: RN and Elizabeth Fisher (adjusted junctures on tubing) Date initiated:: 01/18/19   Consult Status Consult Status: Follow-up Date: 01/19/19 Follow-up type: In-patient    Elizabeth Fisher Elizabeth Fisher 01/18/2019, 7:25 PM

## 2019-01-19 MED ORDER — ACETAMINOPHEN 325 MG PO TABS: 650.0000 mg | ORAL_TABLET | ORAL | 0 refills | Status: AC | PRN

## 2019-01-19 MED ORDER — SENNA 8.6 MG PO TABS: 1.0000 | ORAL_TABLET | Freq: Every day | ORAL | 0 refills | Status: DC

## 2019-01-19 MED ORDER — IBUPROFEN 600 MG PO TABS: 600.0000 mg | ORAL_TABLET | Freq: Four times a day (QID) | ORAL | 0 refills | Status: DC

## 2019-01-19 NOTE — Discharge Instructions (Signed)

## 2019-01-19 NOTE — Lactation Note (Signed)
This note was copied from a baby's chart. Lactation Consultation Note  Patient Name: Elizabeth Fisher IOMBT'D Date: 01/19/2019 Reason for consult: Follow-up assessment;Infant < 6lbs;Infant weight loss Baby is 43 hours/10 % weight loss.  Mom reports that baby breastfed frequently during the night.  She states that she supplements with 12-15 mls of formula by syringe.  Formula was not documented last night.  Mom doesn't have time to do much pumping.  She states baby latches easily and well.  Discussed milk coming to volume and the prevention and treatment of engorgement.  She has a DEBP at home.  Instructed to give enough formula until baby is content.  Mom states that baby does well with syringe.  Reviewed outpatient lactation services and support and encouraged to call prn.  Questions answered.  Maternal Data    Feeding    LATCH Score                   Interventions    Lactation Tools Discussed/Used     Consult Status Consult Status: Complete Follow-up type: Call as needed    Ave Filter 01/19/2019, 9:19 AM

## 2019-01-22 ENCOUNTER — Telehealth: Payer: Medicaid Other

## 2019-01-23 ENCOUNTER — Other Ambulatory Visit: Payer: Self-pay

## 2019-01-23 ENCOUNTER — Ambulatory Visit: Payer: Medicaid Other | Admitting: Certified Nurse Midwife

## 2019-01-23 VITALS — BP 119/91

## 2019-01-23 DIAGNOSIS — O10913 Unspecified pre-existing hypertension complicating pregnancy, third trimester: Secondary | ICD-10-CM

## 2019-01-23 NOTE — Progress Notes (Signed)
Subjective:  Elizabeth Fisher is a 28 y.o. female here for BP check.   Hypertension ROS: not taking any medications at this time. She reports no swelling, headache, or blurred vision.   Objective:  LMP: 04/18/2018  Appearance alert, well appearing, and in no distress  General exam: BP noted to be elevated at this time.Patient reports she has been moving a lot today.     Assessment:   Blood Pressure: 119/91 Patient reports she has been doing a lot of moving around today and she thinks this may be the cause of her blood pressure being slightly elevated.   Plan: Patient advised to recheck blood pressure this evening once she settles down and in the morning. She should send [place] a my chart message with blood pressure readings. Patient have been advised if she has any dizziness, blurred vision or a headache that will not go away she should call us or go to the nearest ER. Patient voice understanding a this time.

## 2019-01-24 ENCOUNTER — Other Ambulatory Visit: Payer: Self-pay | Admitting: Obstetrics and Gynecology

## 2019-01-24 DIAGNOSIS — O165 Unspecified maternal hypertension, complicating the puerperium: Secondary | ICD-10-CM

## 2019-01-24 MED ORDER — AMLODIPINE BESYLATE 5 MG PO TABS: 5.0000 mg | ORAL_TABLET | Freq: Every day | ORAL | 2 refills | Status: DC

## 2019-01-24 NOTE — Progress Notes (Signed)
Elevated diastolic BP in postpartum patient. BP check reported BP of 125/94. Rx for Norvasc 5 mg daily sent to pharmacy on file. Patient notified of Latina Craver, RN.  Laury Deep, CNM

## 2019-01-24 NOTE — Progress Notes (Signed)
Chart reviewed for nurse visit. Agree with plan of care.   Lajean Manes, CNM 01/24/2019 8:21 AM

## 2019-02-28 ENCOUNTER — Encounter: Payer: Self-pay | Admitting: Obstetrics and Gynecology

## 2019-02-28 ENCOUNTER — Other Ambulatory Visit: Payer: Self-pay

## 2019-02-28 ENCOUNTER — Telehealth (INDEPENDENT_AMBULATORY_CARE_PROVIDER_SITE_OTHER): Payer: Medicaid Other | Admitting: Obstetrics and Gynecology

## 2019-02-28 VITALS — BP 115/85 | HR 88 | Wt 118.0 lb

## 2019-02-28 DIAGNOSIS — O165 Unspecified maternal hypertension, complicating the puerperium: Secondary | ICD-10-CM

## 2019-02-28 DIAGNOSIS — Z30011 Encounter for initial prescription of contraceptive pills: Secondary | ICD-10-CM

## 2019-02-28 DIAGNOSIS — I10 Essential (primary) hypertension: Secondary | ICD-10-CM

## 2019-02-28 MED ORDER — NORETHINDRONE 0.35 MG PO TABS: 1.0000 | ORAL_TABLET | Freq: Every day | ORAL | 11 refills | Status: DC

## 2019-02-28 NOTE — Progress Notes (Signed)
MY CHART VIDEO POSTPARTUM VISIT ENCOUNTER NOTE  I connected with@ on 02/28/19 at  9:10 AM EDT by My Chart video at home and verified that I am speaking with the correct person using two identifiers.   I discussed the limitations, risks, security and privacy concerns of performing an evaluation and management service by My Chart video and the availability of in person appointments. I also discussed with the patient that there may be a patient responsible charge related to this service. The patient expressed understanding and agreed to proceed.  Appointment Date: 02/28/2019  OBGYN Clinic: [facility]  Chief Complaint:  Postpartum Visit  History of Present Illness: YARIANNA VARBLE is a 28 y.o. African-American G2P1011 (Patient's last menstrual period was 04/18/2018 (exact date).), seen for the above chief complaint. Her past medical history is significant for cHTN.   She is s/p normal spontaneous vaginal delivery on 01/17/2019 at 39 weeks; she was discharged to home on 01/19/2019 PPD#2. Pregnancy complicated by cHTN and HG. Baby is doing well and gaining weight.  Complains of none at this time.  Vaginal bleeding or discharge: No  Mode of feeding infant: Bottle and Breast Intercourse: No  Contraception: Patient desires pills PP depression s/s: No .  Any bowel or bladder issues: No  Pap smear: no abnormalities (date: 08/08/2018)  Review of Systems: Her 12 point review of systems is negative or as noted in the History of Present Illness.  Patient Active Problem List   Diagnosis Date Noted  . Chronic hypertension complicating or reason for care during pregnancy, third trimester 01/17/2019  . Encounter for induction of labor 01/17/2019  . Second degree perineal laceration, delivered, current hospitalization 01/17/2019  . SVD (spontaneous vaginal delivery) 01/17/2019  . Supervision of normal first pregnancy, antepartum 07/09/2018  . Transient hypertension  of pregnancy 06/24/2018  . Hyperemesis gravidarum with metabolic disturbance, antepartum 06/22/2018    Medications [name] "Aniceto Boss" had no medications administered during this visit. Current Outpatient Medications  Medication Sig Dispense Refill  . amLODipine (NORVASC) 5 MG tablet Take 1 tablet (5 mg total) by mouth daily. 30 tablet 2  . ibuprofen (ADVIL) 600 MG tablet Take 1 tablet (600 mg total) by mouth every 6 (six) hours. (Patient not taking: Reported on 01/23/2019) 30 tablet 0  . Prenatal Vit-Fe Phos-FA-Omega (VITAFOL GUMMIES) 3.33-0.333-34.8 MG CHEW Chew 3 each by mouth daily. 90 tablet 12  . senna (SENOKOT) 8.6 MG TABS tablet Take 1 tablet (8.6 mg total) by mouth daily. (Patient not taking: Reported on 01/23/2019) 120 tablet 0   No current facility-administered medications for this visit.     Allergies Patient has no known allergies.  Physical Exam:  General:  Alert, oriented and cooperative.   Mental Status: Normal mood and affect perceived. Normal judgment and thought content.  Rest of physical exam deferred due to type of encounter  PP Depression Screening:   Edinburgh Postnatal Depression Scale - 02/28/19 0936      Edinburgh Postnatal Depression Scale:  In the Past 7 Days   I have been able to laugh and see the funny side of things.  0    I have looked forward with enjoyment to things.  0    I have blamed myself unnecessarily when things went wrong.  0    I have been anxious or worried for no good reason.  1    I have felt scared or panicky for no good reason.  0  Things have been getting on top of me.  0    I have been so unhappy that I have had difficulty sleeping.  0    I have felt sad or miserable.  0    I have been so unhappy that I have been crying.  0    The thought of harming myself has occurred to me.  0    Edinburgh Postnatal Depression Scale Total  1       Assessment:Patient is a 28 y.o. G2P1011 who is 6 weeks postpartum from a normal  spontaneous vaginal delivery.  She is doing well.   Plan: 1) Hypertension, postpartum condition or complication - Continue Norvasc 5 mg daily  2) Encounter for postpartum care of lactating mother - Normal PP visit  3) Encounter for initial prescription of contraceptive pills  - Rx for norethindrone (MICRONOR) 0.35 MG tablet - Monitor BP daily  4) Chronic hypertension  - Ambulatory referral to [facility]   RTC 1 year for AEX  I discussed the assessment and treatment plan with the patient. The patient was provided an opportunity to ask questions and all were answered. The patient agreed with the plan and demonstrated an understanding of the instructions.   The patient was advised to call back or seek an in-person evaluation/go to the ED for any concerning postpartum symptoms.  I provided 10 minutes of non-face-to-face time during this encounter. There was 5 minutes of chart review time spent prior to this encounter. Total time spent = 15 minutes.    Laury Deep, [facility] [place]

## 2019-04-08 ENCOUNTER — Encounter: Payer: Self-pay | Admitting: *Deleted

## 2019-04-08 ENCOUNTER — Ambulatory Visit: Payer: Medicaid Other

## 2019-04-26 ENCOUNTER — Ambulatory Visit (INDEPENDENT_AMBULATORY_CARE_PROVIDER_SITE_OTHER): Payer: Medicaid Other | Admitting: Primary Care

## 2019-05-06 ENCOUNTER — Other Ambulatory Visit: Payer: Self-pay | Admitting: Obstetrics and Gynecology

## 2019-05-06 DIAGNOSIS — O165 Unspecified maternal hypertension, complicating the puerperium: Secondary | ICD-10-CM

## 2019-05-23 ENCOUNTER — Encounter (HOSPITAL_COMMUNITY): Payer: Self-pay | Admitting: Obstetrics & Gynecology

## 2019-07-23 ENCOUNTER — Other Ambulatory Visit: Payer: Self-pay | Admitting: Obstetrics and Gynecology

## 2019-07-23 DIAGNOSIS — O165 Unspecified maternal hypertension, complicating the puerperium: Secondary | ICD-10-CM

## 2019-08-02 ENCOUNTER — Ambulatory Visit: Payer: Medicaid Other | Attending: Internal Medicine

## 2019-08-02 DIAGNOSIS — Z23 Encounter for immunization: Secondary | ICD-10-CM

## 2019-08-02 NOTE — Progress Notes (Signed)
   Covid-19 Vaccination Clinic  Name:  Elizabeth Fisher    MRN: [id number] DOB: 10-18-1990  08/02/2019  [name] was observed post Covid-19 immunization for 15 minutes without incident. She was provided with Vaccine Information Sheet and instruction to access the V-Safe system.   [name] was instructed to call 911 with any severe reactions post vaccine: Marland Kitchen Difficulty breathing  . Swelling of face and throat  . A fast heartbeat  . A bad rash all over body  . Dizziness and weakness   Immunizations Administered    Name Date Dose VIS Date Route   Pfizer COVID-19 Vaccine 08/02/2019  4:32 PM 0.3 mL 04/19/2019 Intramuscular   Manufacturer: [employer], [employer]   Lot: [id number]   NDC: [address]-0

## 2019-08-27 ENCOUNTER — Ambulatory Visit: Payer: Medicaid Other | Attending: Internal Medicine

## 2019-08-27 DIAGNOSIS — Z23 Encounter for immunization: Secondary | ICD-10-CM

## 2019-08-27 NOTE — Progress Notes (Signed)
   Covid-19 Vaccination Clinic  Name:  Elizabeth Fisher    MRN: [id number] DOB: [birth year]  08/27/2019  [name] was observed post Covid-19 immunization for 15 minutes without incident. She was provided with Vaccine Information Sheet and instruction to access the V-Safe system.   [name] was instructed to call 911 with any severe reactions post vaccine: Marland Kitchen Difficulty breathing  . Swelling of face and throat  . A fast heartbeat  . A bad rash all over body  . Dizziness and weakness   Immunizations Administered    Name Date Dose VIS Date Route   Pfizer COVID-19 Vaccine 08/27/2019  4:19 PM 0.3 mL 07/03/2018 Intramuscular   Manufacturer: [employer], [employer]   Lot: [id number]   NDC: [address]-5

## 2019-12-31 ENCOUNTER — Emergency Department (HOSPITAL_COMMUNITY)
Admission: EM | Admit: 2019-12-31 | Discharge: 2019-12-31 | Discharge status: Against Medical Advice | Payer: Medicaid Other

## 2019-12-31 NOTE — ED Notes (Signed)
Patient left against medical advice.

## 2020-01-01 ENCOUNTER — Ambulatory Visit (INDEPENDENT_AMBULATORY_CARE_PROVIDER_SITE_OTHER): Payer: Self-pay | Admitting: *Deleted
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
## 2020-01-02 ENCOUNTER — Ambulatory Visit (HOSPITAL_COMMUNITY): Admit: 2020-01-02 | Discharge status: Against Medical Advice | Payer: Medicaid Other

## 2020-01-03 ENCOUNTER — Other Ambulatory Visit: Payer: Self-pay

## 2020-01-03 ENCOUNTER — Encounter (HOSPITAL_COMMUNITY): Payer: Self-pay

## 2020-01-03 ENCOUNTER — Ambulatory Visit (HOSPITAL_COMMUNITY)
Admission: EM | Admit: 2020-01-03 | Discharge: 2020-01-03 | Disposition: A | Discharge status: Home / Self Care | Payer: Medicaid Other | Attending: Family Medicine | Admitting: Family Medicine

## 2020-01-03 VITALS — BP 122/91 | HR 80 | Temp 98.3°F | Resp 18

## 2020-01-03 DIAGNOSIS — M542 Cervicalgia: Secondary | ICD-10-CM

## 2020-01-03 DIAGNOSIS — S161XXA Strain of muscle, fascia and tendon at neck level, initial encounter: Secondary | ICD-10-CM

## 2020-01-03 DIAGNOSIS — R112 Nausea with vomiting, unspecified: Secondary | ICD-10-CM

## 2020-01-03 MED ORDER — CYCLOBENZAPRINE HCL 10 MG PO TABS: 10.0000 mg | ORAL_TABLET | Freq: Two times a day (BID) | ORAL | 0 refills | Status: DC | PRN

## 2020-01-03 NOTE — Discharge Instructions (Addendum)
Take ibuprofen as needed for your pain.    Take the muscle relaxer Flexeril as needed for muscle spasm; Do not drive, operate machinery, or drink alcohol with this medication as it may make you drowsy.    Follow up with your primary care provider or an orthopedist if your pain is not improving.     
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
## 2020-01-09 ENCOUNTER — Encounter (INDEPENDENT_AMBULATORY_CARE_PROVIDER_SITE_OTHER): Payer: Self-pay | Admitting: Primary Care

## 2020-01-09 ENCOUNTER — Telehealth (INDEPENDENT_AMBULATORY_CARE_PROVIDER_SITE_OTHER): Payer: Medicaid Other | Admitting: Primary Care

## 2020-01-09 DIAGNOSIS — I10 Essential (primary) hypertension: Secondary | ICD-10-CM

## 2020-01-09 DIAGNOSIS — Z7689 Persons encountering health services in other specified circumstances: Secondary | ICD-10-CM | POA: Diagnosis not present

## 2020-01-09 DIAGNOSIS — O165 Unspecified maternal hypertension, complicating the puerperium: Secondary | ICD-10-CM

## 2020-01-09 MED ORDER — AMLODIPINE BESYLATE 5 MG PO TABS: 5.0000 mg | ORAL_TABLET | Freq: Every day | ORAL | 2 refills | Status: AC
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
## 2020-01-17 ENCOUNTER — Other Ambulatory Visit: Payer: Self-pay

## 2020-01-17 ENCOUNTER — Encounter: Payer: Self-pay | Admitting: Family Medicine

## 2020-01-17 ENCOUNTER — Ambulatory Visit (INDEPENDENT_AMBULATORY_CARE_PROVIDER_SITE_OTHER): Payer: Medicaid Other | Admitting: Family Medicine

## 2020-01-17 DIAGNOSIS — M542 Cervicalgia: Secondary | ICD-10-CM | POA: Diagnosis not present
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
## 2020-03-10 ENCOUNTER — Ambulatory Visit (INDEPENDENT_AMBULATORY_CARE_PROVIDER_SITE_OTHER): Payer: Medicaid Other | Admitting: Primary Care

## 2022-04-14 ENCOUNTER — Emergency Department (HOSPITAL_COMMUNITY)
Admission: EM | Admit: 2022-04-14 | Discharge: 2022-04-14 | Disposition: A | Discharge status: Home / Self Care | Payer: Medicaid Other | Attending: Emergency Medicine | Admitting: Emergency Medicine

## 2022-04-14 ENCOUNTER — Emergency Department (HOSPITAL_COMMUNITY): Payer: Medicaid Other

## 2022-04-14 DIAGNOSIS — U071 COVID-19: Secondary | ICD-10-CM | POA: Insufficient documentation

## 2022-04-14 DIAGNOSIS — M545 Low back pain, unspecified: Secondary | ICD-10-CM | POA: Insufficient documentation

## 2022-04-14 DIAGNOSIS — R1013 Epigastric pain: Secondary | ICD-10-CM | POA: Diagnosis present

## 2022-04-14 DIAGNOSIS — K219 Gastro-esophageal reflux disease without esophagitis: Secondary | ICD-10-CM | POA: Diagnosis not present

## 2022-04-14 LAB — URINALYSIS, ROUTINE W REFLEX MICROSCOPIC
Bilirubin Urine: NEGATIVE
Glucose, UA: NEGATIVE mg/dL
Hgb urine dipstick: NEGATIVE
Ketones, ur: 5 mg/dL — AB
Leukocytes,Ua: NEGATIVE
Nitrite: NEGATIVE
Protein, ur: 30 mg/dL — AB
Specific Gravity, Urine: 1.026 (ref 1.005–1.030)
pH: 5 (ref 5.0–8.0)

## 2022-04-14 LAB — CBC WITH DIFFERENTIAL/PLATELET
Abs Immature Granulocytes: 0.01 10*3/uL (ref 0.00–0.07)
Basophils Absolute: 0 10*3/uL (ref 0.0–0.1)
Basophils Relative: 1 %
Eosinophils Absolute: 0 10*3/uL (ref 0.0–0.5)
Eosinophils Relative: 0 %
HCT: 42.5 % (ref 36.0–46.0)
Hemoglobin: 14.5 g/dL (ref 12.0–15.0)
Immature Granulocytes: 0 %
Lymphocytes Relative: 26 %
Lymphs Abs: 1.1 10*3/uL (ref 0.7–4.0)
MCH: 28 pg (ref 26.0–34.0)
MCHC: 34.1 g/dL (ref 30.0–36.0)
MCV: 82.2 fL (ref 80.0–100.0)
Monocytes Absolute: 0.7 10*3/uL (ref 0.1–1.0)
Monocytes Relative: 17 %
Neutro Abs: 2.4 10*3/uL (ref 1.7–7.7)
Neutrophils Relative %: 56 %
Platelets: 246 10*3/uL (ref 150–400)
RBC: 5.17 MIL/uL — ABNORMAL HIGH (ref 3.87–5.11)
RDW: 12.5 % (ref 11.5–15.5)
WBC: 4.3 10*3/uL (ref 4.0–10.5)
nRBC: 0 % (ref 0.0–0.2)

## 2022-04-14 LAB — COMPREHENSIVE METABOLIC PANEL
ALT: 15 U/L (ref 0–44)
AST: 21 U/L (ref 15–41)
Albumin: 4.4 g/dL (ref 3.5–5.0)
Alkaline Phosphatase: 48 U/L (ref 38–126)
Anion gap: 11 (ref 5–15)
BUN: 12 mg/dL (ref 6–20)
CO2: 20 mmol/L — ABNORMAL LOW (ref 22–32)
Calcium: 9.8 mg/dL (ref 8.9–10.3)
Chloride: 107 mmol/L (ref 98–111)
Creatinine, Ser: 0.8 mg/dL (ref 0.44–1.00)
GFR, Estimated: >60 mL/min (ref >60)
Glucose, Bld: 107 mg/dL — ABNORMAL HIGH (ref 70–99)
Potassium: 3.5 mmol/L (ref 3.5–5.1)
Sodium: 138 mmol/L (ref 135–145)
Total Bilirubin: 0.4 mg/dL (ref 0.3–1.2)
Total Protein: 7.6 g/dL (ref 6.5–8.1)

## 2022-04-14 LAB — I-STAT BETA HCG BLOOD, ED (MC, WL, AP ONLY): I-stat hCG, quantitative: <5 m[IU]/mL (ref <5)

## 2022-04-14 LAB — RESP PANEL BY RT-PCR (FLU A&B, COVID) ARPGX2
Influenza A by PCR: NEGATIVE
Influenza B by PCR: NEGATIVE
SARS Coronavirus 2 by RT PCR: POSITIVE — AB

## 2022-04-14 LAB — LIPASE, BLOOD: Lipase: 28 U/L (ref 11–51)

## 2022-04-14 MED ORDER — FAMOTIDINE IN NACL 20-0.9 MG/50ML-% IV SOLN: 20.0000 mg | Freq: Once | INTRAVENOUS | Status: DC

## 2022-04-14 MED ORDER — FAMOTIDINE 20 MG PO TABS
20.0000 mg | ORAL_TABLET | Freq: Once | ORAL | Status: AC
  Administered 2022-04-14: 20 mg via ORAL
  Filled 2022-04-14: qty 1

## 2022-04-14 MED ORDER — FAMOTIDINE 20 MG PO TABS
20.0000 mg | ORAL_TABLET | Freq: Two times a day (BID) | ORAL | 0 refills | Status: AC
  Filled 2022-04-14: qty 30, 15d supply, fill #0

## 2022-04-14 MED ORDER — SUCRALFATE 1 GM/10ML PO SUSP
1.0000 g | Freq: Three times a day (TID) | ORAL | 0 refills | Status: DC
  Filled 2022-04-14: qty 420, 11d supply, fill #0

## 2022-04-14 MED ORDER — ALUM & MAG HYDROXIDE-SIMETH 200-200-20 MG/5ML PO SUSP
30.0000 mL | Freq: Once | ORAL | Status: AC
  Administered 2022-04-14: 30 mL via ORAL
  Filled 2022-04-14: qty 30

## 2022-04-14 NOTE — Discharge Instructions (Addendum)
It was a pleasure taking care of you!    Your labs are overall unremarkable today.  Ultrasound did not show any concerning findings today.  Your flu swab was negative.  Your COVID swab was positive for COVID.  According to the CDC, you must self quarantine for 5 days from the start of your symptoms.  Your quarantine period ends on  04/17/22.  You may take over-the-counter cough and cold medications as needed for your symptoms. Ensure to maintain fluid intake with tea, soup, broth, Pedialyte, Gatorade, water.  You will be sent a prescription for Pepcid and carafate, take as directed.  You may follow-up with your primary care provider as needed.  Return to the Emergency Department if you are experiencing increasing/worsening symptoms.

## 2022-04-14 NOTE — ED Provider Notes (Signed)
[facility] EMERGENCY DEPARTMENT Provider Note   CSN: [id number] Arrival date & time: 04/14/22  1131     History  Chief Complaint  Patient presents with   Back Pain    Elizabeth Fisher is a 31 y.o. female who presents emergency department with concerns for bilateral lower back pain onset 3 days. Denies sick contacts. Has associated resolved nausea, vomiting, right upper abdominal pain. Denies urinary symptoms, chest pain, shortness of breath. Has a history of GERD. Still has her gallbladder and appendix.   The history is provided by the patient. No language interpreter was used.       Home Medications Prior to Admission medications   Medication Sig Start Date End Date Taking? Authorizing Provider  acetaminophen (TYLENOL) 500 MG tablet Take 500 mg by mouth daily as needed for mild pain, moderate pain, fever or headache.   Yes [provider]  bismuth subsalicylate (PEPTO BISMOL) 262 MG chewable tablet Chew 524 mg by mouth 2 (two) times daily as needed for diarrhea or loose stools or indigestion.   Yes [provider]  Carboxymethylcellulose Sodium (DRY EYE RELIEF OP) Place 1 drop into both eyes 2 (two) times daily as needed (dry eyes).   Yes [provider]  famotidine (PEPCID) 20 MG tablet Take 1 tablet (20 mg total) by mouth 2 (two) times daily. 04/14/22  Yes Shina Wass A, PA-C  Menthol, Topical Analgesic, (BENGAY EX) Apply 1 patch topically daily as needed (pain).   Yes [provider]  sucralfate (CARAFATE) 1 GM/10ML suspension Take 10 mLs (1 g total) by mouth 4 (four) times daily -  with meals and at bedtime. 04/14/22  Yes Raeleen Winstanley A, PA-C  amLODipine (NORVASC) 5 MG tablet Take 1 tablet (5 mg total) by mouth daily. Patient not taking: Reported on 04/14/2022 01/09/20   Kerin Perna, NP      Allergies    Patient has no known allergies.    Review of Systems   Review of Systems  Musculoskeletal:  Positive for back  pain.  All other systems reviewed and are negative.   Physical Exam Updated Vital Signs BP (!) 126/110 (BP Location: Right Arm)   Pulse 85   Temp 98.5 F (36.9 C) (Oral)   Resp 16   SpO2 95%  Physical Exam Vitals and nursing note reviewed.  Constitutional:      General: She is not in acute distress.    Appearance: She is not diaphoretic.  HENT:     Head: Normocephalic and atraumatic.     Mouth/Throat:     Pharynx: No oropharyngeal exudate.  Eyes:     General: No scleral icterus.    Conjunctiva/sclera: Conjunctivae normal.  Cardiovascular:     Rate and Rhythm: Normal rate and regular rhythm.     Pulses: Normal pulses.     Heart sounds: Normal heart sounds.  Pulmonary:     Effort: Pulmonary effort is normal. No respiratory distress.     Breath sounds: Normal breath sounds. No wheezing.  Abdominal:     General: Bowel sounds are normal.     Palpations: Abdomen is soft. There is no mass.     Tenderness: There is no abdominal tenderness. There is no guarding or rebound.     Comments: Mild epigastric and RUQ TTP.   Musculoskeletal:        General: Normal range of motion.     Cervical back: Normal range of motion and neck supple.  Skin:  General: Skin is warm and dry.  Neurological:     Mental Status: She is alert.  Psychiatric:        Behavior: Behavior normal.     ED Results / Procedures / Treatments   Labs (all labs ordered are listed, but only abnormal results are displayed) Labs Reviewed  RESP PANEL BY RT-PCR (FLU A&B, COVID) ARPGX2 - Abnormal; Notable for the following components:      Result Value   SARS Coronavirus 2 by RT PCR POSITIVE (*)    All other components within normal limits  URINALYSIS, ROUTINE W REFLEX MICROSCOPIC - Abnormal; Notable for the following components:   APPearance HAZY (*)    Ketones, ur 5 (*)    Protein, ur 30 (*)    Bacteria, UA FEW (*)    All other components within normal limits  CBC WITH DIFFERENTIAL/PLATELET - Abnormal;  Notable for the following components:   RBC 5.17 (*)    All other components within normal limits  COMPREHENSIVE METABOLIC PANEL - Abnormal; Notable for the following components:   CO2 20 (*)    Glucose, Bld 107 (*)    All other components within normal limits  LIPASE, BLOOD  I-STAT BETA HCG BLOOD, ED ([facility], [facility], [facility] ONLY)    EKG None  Radiology US Abdomen Limited RUQ (LIVER/GB)  Result Date: 04/14/2022 CLINICAL DATA:  Right upper quadrant abdominal pain. EXAM: ULTRASOUND ABDOMEN LIMITED RIGHT UPPER QUADRANT COMPARISON:  Ultrasound dated 11/08/2013. FINDINGS: Gallbladder: No gallstones or wall thickening visualized. No sonographic Murphy sign noted by sonographer. Common bile duct: Diameter: 2 mm Liver: No focal lesion identified. Within normal limits in parenchymal echogenicity. Portal vein is patent on color Doppler imaging with normal direction of blood flow towards the liver. Other: None. IMPRESSION: Unremarkable right upper quadrant ultrasound. Electronically Signed   By: Anner Crete M.D.   On: 04/14/2022 17:42    Procedures Procedures    Medications Ordered in ED Medications  alum & mag hydroxide-simeth (MAALOX/MYLANTA) 200-200-20 MG/5ML suspension 30 mL (30 mLs Oral Given 04/14/22 1829)  famotidine (PEPCID) tablet 20 mg (20 mg Oral Given 04/14/22 1829)    ED Course/ Medical Decision Making/ A&P Clinical Course as of 04/14/22 1907  Thu Apr 14, 2022  1454 SARS Coronavirus 2 by RT PCR(!): POSITIVE [GL]  1836 Re-evaluated and noted improvement of symptoms with treatment regimen. Discussed discharge treatment plan. Pt agreeable at this time. Pt appears safe for discharge. [SB]    Clinical Course User Index [GL] Loeffler, Adora Fridge, PA-C [SB] Chapin Arduini A, PA-C                           Medical Decision Making Amount and/or Complexity of Data Reviewed Radiology: ordered.  Risk OTC drugs. Prescription drug management.   Pt presents with concerns with bilateral lower  back pain onset 3 days.  Also notes epigastric abdominal pain.  Still has her gallbladder and appendix.  Patient afebrile.  On exam patient with mild epigastric and right upper quadrant tenderness to palpation. No acute cardiovascular, respiratory, abdominal exam findings. Differential diagnosis includes cholelithiasis, cholecystitis, GERD, COVID, flu.    Labs:  I ordered, and personally interpreted labs.  The pertinent results include:   Lipase negative CBC negative CMP negative I-stat beta hCG negative COVID swab positive Flu swab negative Urinalysis negative  Imaging: I ordered imaging studies including  RUQ [place] I independently visualized and interpreted imaging which showed: no acute findings I  agree with the radiologist interpretation  Medications:  I ordered medication including pepcid, GI cocktail for symptom management Reevaluation of the patient after these medicines and interventions, I reevaluated the patient and found that they have improved I have reviewed the patients home medicines and have made adjustments as needed  Disposition: Presentation suspicious for COVID-19.  Doubt cholecystitis, cholelithiasis at this time.  Also suspicious for GERD, patient with relief of symptoms with treatment regimen in the ED.  Doubt flu at this time. After consideration of the diagnostic results and the patients response to treatment, I feel that the patient would benefit from Discharge home.  Work note provided.  Patient sent with a prescription for Pepcid and Carafate.  Supportive care measures and strict return precautions discussed with patient at bedside. Pt acknowledges and verbalizes understanding. Pt appears safe for discharge. Follow up as indicated in discharge paperwork.    This chart was dictated using voice recognition software, Dragon. Despite the best efforts of this provider to proofread and correct errors, errors may still occur which can change documentation  meaning.   Final Clinical Impression(s) / ED Diagnoses Final diagnoses:  COVID-19  Gastroesophageal reflux disease, unspecified whether esophagitis present    Rx / DC Orders ED Discharge Orders          Ordered    famotidine (PEPCID) 20 MG tablet  2 times daily        04/14/22 1849    sucralfate (CARAFATE) 1 GM/10ML suspension  3 times daily with meals & bedtime        04/14/22 1849              Loy Little A, PA-C 04/14/22 1907    Lonell Grandchild, MD 04/15/22 1059

## 2022-04-14 NOTE — ED Provider Triage Note (Signed)
Emergency Medicine Provider Triage Evaluation Note  Elizabeth Fisher , a 31 y.o. female  was evaluated in triage.  Pt complains of a couple aspirin respiratory symptoms that been ongoing since yesterday.  She has had sore throat, nonproductive cough, and chills.  She also states that she is developed a right flank pain and right upper quadrant pain.  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  She denies any history of gallbladder problems but does states she has a family history of this..  Review of Systems  Positive:  Negative:   Physical Exam  BP (!) 126/110 (BP Location: Right Arm)   Pulse 85   Temp 98.5 F (36.9 C) (Oral)   Resp 16   SpO2 95%  Gen:   Awake, no distress   Resp:  Normal effort  MSK:   Moves extremities without difficulty  Other:  She does have right CVA tenderness as well as right upper quadrant tenderness Throat without any sign of peritonsillar abscess, tonsillar exudates or swelling  Medical Decision Making  Medically screening exam initiated at 1:15 PM.  Appropriate orders placed.  Elizabeth Fisher was informed that the remainder of the evaluation will be completed by another provider, this initial triage assessment does not replace that evaluation, and the importance of remaining in the ED until their evaluation is complete.     Claudie Leach, PA-C 04/14/22 1316

## 2022-04-14 NOTE — ED Triage Notes (Signed)
Patient states she has had back pain and sore throat since Tuesday this week and wants to be tested for COVID. Patient is alert, oriented, speaking in complete sentences, and is in no apparent distress at this time.

## 2022-04-15 ENCOUNTER — Other Ambulatory Visit (HOSPITAL_COMMUNITY): Payer: Self-pay

## 2022-04-15 ENCOUNTER — Telehealth (HOSPITAL_COMMUNITY): Payer: Self-pay | Admitting: Emergency Medicine

## 2022-04-15 MED ORDER — SUCRALFATE 1 G PO TABS: 1.0000 g | ORAL_TABLET | Freq: Three times a day (TID) | ORAL | 0 refills | Status: DC

## 2022-04-17 NOTE — Telephone Encounter (Signed)
Different pharmacy needed Prescription was redireted

## 2022-04-18 ENCOUNTER — Other Ambulatory Visit (HOSPITAL_COMMUNITY): Payer: Self-pay

## 2022-05-13 DIAGNOSIS — Z0000 Encounter for general adult medical examination without abnormal findings: Secondary | ICD-10-CM | POA: Diagnosis not present

## 2022-05-13 DIAGNOSIS — F418 Other specified anxiety disorders: Secondary | ICD-10-CM | POA: Diagnosis not present

## 2022-05-13 DIAGNOSIS — Z113 Encounter for screening for infections with a predominantly sexual mode of transmission: Secondary | ICD-10-CM | POA: Diagnosis not present

## 2022-05-13 DIAGNOSIS — K3184 Gastroparesis: Secondary | ICD-10-CM | POA: Diagnosis not present

## 2022-05-13 DIAGNOSIS — K219 Gastro-esophageal reflux disease without esophagitis: Secondary | ICD-10-CM | POA: Diagnosis not present

## 2022-06-08 DIAGNOSIS — F411 Generalized anxiety disorder: Secondary | ICD-10-CM | POA: Diagnosis not present

## 2022-06-16 DIAGNOSIS — F411 Generalized anxiety disorder: Secondary | ICD-10-CM | POA: Diagnosis not present

## 2022-06-16 DIAGNOSIS — Z3009 Encounter for other general counseling and advice on contraception: Secondary | ICD-10-CM | POA: Diagnosis not present

## 2022-06-17 ENCOUNTER — Other Ambulatory Visit (HOSPITAL_COMMUNITY): Payer: Self-pay

## 2022-06-17 MED ORDER — HYDROXYZINE HCL 10 MG PO TABS
10.0000 mg | ORAL_TABLET | Freq: Two times a day (BID) | ORAL | 0 refills | Status: DC | PRN
  Filled 2022-06-17: qty 60, 30d supply, fill #0

## 2022-06-17 MED ORDER — MEDROXYPROGESTERONE ACETATE 150 MG/ML IM SUSY
150.0000 mg | PREFILLED_SYRINGE | INTRAMUSCULAR | 6 refills | Status: AC
  Filled 2022-06-17: qty 1, 90d supply, fill #0
  Filled 2022-09-10: qty 1, 84d supply, fill #1

## 2022-06-20 DIAGNOSIS — Z309 Encounter for contraceptive management, unspecified: Secondary | ICD-10-CM | POA: Diagnosis not present

## 2022-07-07 DIAGNOSIS — F411 Generalized anxiety disorder: Secondary | ICD-10-CM | POA: Diagnosis not present

## 2022-07-08 ENCOUNTER — Encounter: Payer: Self-pay | Admitting: Radiology

## 2022-07-28 DIAGNOSIS — Z13220 Encounter for screening for lipoid disorders: Secondary | ICD-10-CM | POA: Diagnosis not present

## 2022-07-28 DIAGNOSIS — Z113 Encounter for screening for infections with a predominantly sexual mode of transmission: Secondary | ICD-10-CM | POA: Diagnosis not present

## 2022-07-28 DIAGNOSIS — K3184 Gastroparesis: Secondary | ICD-10-CM | POA: Diagnosis not present

## 2022-07-28 DIAGNOSIS — D649 Anemia, unspecified: Secondary | ICD-10-CM | POA: Diagnosis not present

## 2022-08-07 DIAGNOSIS — F411 Generalized anxiety disorder: Secondary | ICD-10-CM | POA: Diagnosis not present

## 2022-09-12 ENCOUNTER — Other Ambulatory Visit (HOSPITAL_COMMUNITY): Payer: Self-pay

## 2022-09-12 MED ORDER — HYDROXYZINE HCL 10 MG PO TABS
10.0000 mg | ORAL_TABLET | Freq: Two times a day (BID) | ORAL | 2 refills | Status: AC | PRN
  Filled 2022-09-12: qty 60, 30d supply, fill #0

## 2022-09-13 ENCOUNTER — Other Ambulatory Visit (HOSPITAL_COMMUNITY): Payer: Self-pay

## 2022-09-13 ENCOUNTER — Other Ambulatory Visit: Payer: Self-pay

## 2022-09-13 MED ORDER — MEDROXYPROGESTERONE ACETATE 150 MG/ML IM SUSP
150.0000 mg | INTRAMUSCULAR | 6 refills | Status: AC
  Filled 2022-09-13: qty 1, 84d supply, fill #0

## 2022-09-23 ENCOUNTER — Other Ambulatory Visit (HOSPITAL_COMMUNITY): Payer: Self-pay

## 2023-12-18 ENCOUNTER — Other Ambulatory Visit (HOSPITAL_COMMUNITY): Payer: Self-pay

## 2023-12-18 MED ORDER — METRONIDAZOLE 500 MG PO TABS
500.0000 mg | ORAL_TABLET | Freq: Two times a day (BID) | ORAL | 0 refills | Status: AC
  Filled 2023-12-18: qty 14, 7d supply, fill #0

## 2023-12-26 ENCOUNTER — Other Ambulatory Visit (HOSPITAL_COMMUNITY): Payer: Self-pay

## 2023-12-26 ENCOUNTER — Telehealth: Admitting: Emergency Medicine

## 2023-12-26 ENCOUNTER — Ambulatory Visit: Payer: Self-pay

## 2023-12-26 DIAGNOSIS — K219 Gastro-esophageal reflux disease without esophagitis: Secondary | ICD-10-CM | POA: Diagnosis not present

## 2023-12-26 MED ORDER — OMEPRAZOLE 20 MG PO CPDR
20.0000 mg | DELAYED_RELEASE_CAPSULE | Freq: Every day | ORAL | 0 refills | Status: AC
  Filled 2023-12-26: qty 30, 30d supply, fill #0

## 2023-12-26 MED ORDER — SUCRALFATE 1 G PO TABS
1.0000 g | ORAL_TABLET | Freq: Three times a day (TID) | ORAL | 0 refills | Status: AC
  Filled 2023-12-26: qty 120, 30d supply, fill #0

## 2023-12-26 NOTE — Progress Notes (Signed)
 Virtual Visit Consent   Elizabeth Fisher, you are scheduled for a virtual visit with a [facility] provider today. Just as with appointments in the office, your consent must be obtained to participate. Your consent will be active for this visit and any virtual visit you may have with one of our providers in the next 365 days. If you have a MyChart account, a copy of this consent can be sent to you electronically.  As this is a virtual visit, video technology does not allow for your provider to perform a traditional examination. This may limit your provider's ability to fully assess your condition. If your provider identifies any concerns that need to be evaluated in person or the need to arrange testing (such as labs, EKG, etc.), we will make arrangements to do so. Although advances in technology are sophisticated, we cannot ensure that it will always work on either your end or our end. If the connection with a video visit is poor, the visit may have to be switched to a telephone visit. With either a video or telephone visit, we are not always able to ensure that we have a secure connection.  By engaging in this virtual visit, you consent to the provision of healthcare and authorize for your insurance to be billed (if applicable) for the services provided during this visit. Depending on your insurance coverage, you may receive a charge related to this service.  I need to obtain your verbal consent now. Are you willing to proceed with your visit today? Elizabeth Fisher has provided verbal consent on 12/26/2023 for a virtual visit (video or telephone). Elizabeth CHRISTELLA Belt, NP  Date: 12/26/2023 1:59 PM   Virtual Visit via Video Note   I, Elizabeth Fisher, connected with  Elizabeth Fisher  ([id number], Dec 11, 1990) on 12/26/23 at  1:45 PM EDT by a video-enabled telemedicine application and verified that I am speaking with the correct person using two identifiers.  Location: Patient: Virtual Visit Location  Patient: Home Provider: Virtual Visit Location Provider: Home Office   I discussed the limitations of evaluation and management by telemedicine and the availability of in person appointments. The patient expressed understanding and agreed to proceed.    History of Present Illness: Elizabeth Fisher is a 33 y.o. who identifies as a female who was assigned female at birth, and is being seen today for possible acid reflux.  Has had reflux intermittently since birth of her daughter. Doesn't take medicine for it, just drinks water or milk, this helps a little. Peppermint oil in her navel has helped lately.   Exacerbation started 12/21/23 after eating chicken tenders. Eventually felt better but now when she burps it tastes like acid. Today drank orange juice and then vomited, has vomited x 3. Initially looked like orange juice then looked like stomach acid. Felt fine until the orange juice. Does have burning pain in area of esophagus sometimes. Does have abd pain in periumbilical area at rest and with palpation. Initially thought was her gall bladder but after googling, thinks it is acid reflux.  No diarrhea, fever, body aches, headaches. No blood in emesis.   HPI: HPI  Problems:  Patient Active Problem List   Diagnosis Date Noted   Chronic hypertension complicating or reason for care during pregnancy, third trimester 01/17/2019   Encounter for induction of labor 01/17/2019   Second degree perineal laceration, delivered, current hospitalization 01/17/2019   SVD (spontaneous vaginal delivery) 01/17/2019   Transient hypertension of pregnancy 06/24/2018  Hyperemesis gravidarum with metabolic disturbance, antepartum 06/22/2018    Allergies: No Known Allergies Medications:  Current Outpatient Medications:    omeprazole  (PRILOSEC) 20 MG capsule, Take 1 capsule (20 mg total) by mouth daily., Disp: 30 capsule, Rfl: 0   sucralfate  (CARAFATE ) 1 g tablet, Take 1 tablet (1 g total) by mouth 4 (four) times  daily -  with meals and at bedtime., Disp: 120 tablet, Rfl: 0   acetaminophen  (TYLENOL ) 500 MG tablet, Take 500 mg by mouth daily as needed for mild pain, moderate pain, fever or headache., Disp: , Rfl:    amLODipine  (NORVASC ) 5 MG tablet, Take 1 tablet (5 mg total) by mouth daily. (Patient not taking: Reported on 04/14/2022), Disp: 30 tablet, Rfl: 2   bismuth subsalicylate (PEPTO BISMOL) 262 MG chewable tablet, Chew 524 mg by mouth 2 (two) times daily as needed for diarrhea or loose stools or indigestion., Disp: , Rfl:    Carboxymethylcellulose Sodium (DRY EYE RELIEF OP), Place 1 drop into both eyes 2 (two) times daily as needed (dry eyes)., Disp: , Rfl:    famotidine  (PEPCID ) 20 MG tablet, Take 1 tablet (20 mg total) by mouth 2 (two) times daily., Disp: 30 tablet, Rfl: 0   hydrOXYzine  (ATARAX ) 10 MG tablet, Take 1 tablet (10 mg total) by mouth 2 (two) times daily as needed., Disp: 60 tablet, Rfl: 2   medroxyPROGESTERone  (DEPO-PROVERA ) 150 MG/ML injection, Inject 1 mL (150 mg total) into the muscle every 3 (three) months., Disp: 1 mL, Rfl: 6   medroxyPROGESTERone  Acetate (DEPO-PROVERA ) 150 MG/ML SUSY, Inject 1 mL (150 mg total) into the muscle every 3 (three) months., Disp: 1 mL, Rfl: 6   Menthol , Topical Analgesic, (BENGAY EX), Apply 1 patch topically daily as needed (pain)., Disp: , Rfl:   Observations/Objective: Patient is well-developed, well-nourished in no acute distress.  Resting comfortably  at home.  Head is normocephalic, atraumatic.  No labored breathing.  Speech is clear and coherent with logical content.  Patient is alert and oriented at baseline.    Assessment and Plan: 1. Gastroesophageal reflux disease, unspecified whether esophagitis present (Primary)  She as new pcp appt on 01/04/24 and will discuss with new provider.   She has been treated in the past for similar sx with carafate , I will order this again. I will also try omeprazole .   Follow Up Instructions: I discussed  the assessment and treatment plan with the patient. The patient was provided an opportunity to ask questions and all were answered. The patient agreed with the plan and demonstrated an understanding of the instructions.  A copy of instructions were sent to the patient via MyChart unless otherwise noted below.   The patient was advised to call back or seek an in-person evaluation if the symptoms worsen or if the condition fails to improve as anticipated.    Elizabeth CHRISTELLA Belt, NP

## 2023-12-26 NOTE — Patient Instructions (Signed)
 Elizabeth Fisher, thank you for joining Jon CHRISTELLA Belt, NP for today's virtual visit.  While this provider is not your primary care provider (PCP), if your PCP is located in our provider database this encounter information will be shared with them immediately following your visit.   A [facility] MyChart account gives you access to today's visit and all your visits, tests, and labs performed at [facility]  click here if you don't have a [facility] MyChart account or go to [email or web address]  Consent: (Patient) Elizabeth Fisher provided verbal consent for this virtual visit at the beginning of the encounter.  Current Medications:  Current Outpatient Medications:    omeprazole  (PRILOSEC) 20 MG capsule, Take 1 capsule (20 mg total) by mouth daily., Disp: 30 capsule, Rfl: 0   sucralfate  (CARAFATE ) 1 g tablet, Take 1 tablet (1 g total) by mouth 4 (four) times daily -  with meals and at bedtime., Disp: 120 tablet, Rfl: 0   acetaminophen  (TYLENOL ) 500 MG tablet, Take 500 mg by mouth daily as needed for mild pain, moderate pain, fever or headache., Disp: , Rfl:    amLODipine  (NORVASC ) 5 MG tablet, Take 1 tablet (5 mg total) by mouth daily. (Patient not taking: Reported on 04/14/2022), Disp: 30 tablet, Rfl: 2   bismuth subsalicylate (PEPTO BISMOL) 262 MG chewable tablet, Chew 524 mg by mouth 2 (two) times daily as needed for diarrhea or loose stools or indigestion., Disp: , Rfl:    Carboxymethylcellulose Sodium (DRY EYE RELIEF OP), Place 1 drop into both eyes 2 (two) times daily as needed (dry eyes)., Disp: , Rfl:    famotidine  (PEPCID ) 20 MG tablet, Take 1 tablet (20 mg total) by mouth 2 (two) times daily., Disp: 30 tablet, Rfl: 0   hydrOXYzine  (ATARAX ) 10 MG tablet, Take 1 tablet (10 mg total) by mouth 2 (two) times daily as needed., Disp: 60 tablet, Rfl: 2   medroxyPROGESTERone  (DEPO-PROVERA ) 150 MG/ML injection, Inject 1 mL (150 mg total) into the muscle every 3 (three) months.,  Disp: 1 mL, Rfl: 6   medroxyPROGESTERone  Acetate (DEPO-PROVERA ) 150 MG/ML SUSY, Inject 1 mL (150 mg total) into the muscle every 3 (three) months., Disp: 1 mL, Rfl: 6   Menthol , Topical Analgesic, (BENGAY EX), Apply 1 patch topically daily as needed (pain)., Disp: , Rfl:    Medications ordered in this encounter:  Meds ordered this encounter  Medications   sucralfate  (CARAFATE ) 1 g tablet    Sig: Take 1 tablet (1 g total) by mouth 4 (four) times daily -  with meals and at bedtime.    Dispense:  120 tablet    Refill:  0   omeprazole  (PRILOSEC) 20 MG capsule    Sig: Take 1 capsule (20 mg total) by mouth daily.    Dispense:  30 capsule    Refill:  0     *If you need refills on other medications prior to your next appointment, please contact your pharmacy*  Follow-Up: Call back or seek an in-person evaluation if the symptoms worsen or if the condition fails to improve as anticipated.  [facility] [phone]  Other Instructions  I have prescribed 2 medicines for you to try for what is probably acid reflux. Please let your new primary care provider know about your symptoms.    If you have been instructed to have an in-person evaluation today at a local Urgent Care facility, please use the link below. It will take you to a list  of all of our available [facility], including address, phone number and hours of operation. Please do not delay care.  [facility]  If you or a family member do not have a primary care provider, use the link below to schedule a visit and establish care. When you choose a [facility] primary care physician or advanced practice provider, you gain a long-term partner in health. Find a Primary Care Provider  Learn more about [facility] in-office and virtual care options: [facility] Get Care Now

## 2023-12-26 NOTE — Telephone Encounter (Signed)
 FYI Only or Action Required?: FYI only for provider.  Patient was last seen in primary care on not a [facility] pt.  Called Nurse Triage reporting Abdominal Pain.9-10/10 - vomiting.  Symptoms began several days ago.  Interventions attempted: Nothing.  Symptoms are: gradually worsening.  Triage Disposition: Go to ED Now (Notify PCP)  Patient/caregiver understands and will follow disposition?: Yes             Copied from CRM [id number]. Topic: Clinical - Red Word Triage >> Dec 26, 2023  9:25 AM Cherylann RAMAN wrote: Red Word that prompted transfer to Nurse Triage: Patient called in with complaints of vomiting after eating on Thursday and has progressively worse. Patient drink some orange juice and she began to feel nauseous. When her abdomin hit a stapler at work it felt like a sharp pain throughout. Patient also states that she can feel her heartbeat through her stomach. Patient has a history of GERD. Reason for Disposition  [1] SEVERE pain (e.g., excruciating) AND [2] present > 1 hour  Answer Assessment - Initial Assessment Questions 1. LOCATION: Where does it hurt?      Middle above belly 2. RADIATION: Does the pain shoot anywhere else? (e.g., chest, back)     no 3. ONSET: When did the pain begin? (e.g., minutes, hours or days ago)      Thursday 4. SUDDEN: Gradual or sudden onset?     sudden 5. PATTERN Does the pain come and go, or is it constant?     Comes and goes 6. SEVERITY: How bad is the pain?  (e.g., Scale 1-10; mild, moderate, or severe)     9-10/10 7. RECURRENT SYMPTOM: Have you ever had this type of stomach pain before? If Yes, ask: When was the last time? and What happened that time?      Has had stomach and vomiting - GERD. 8. CAUSE: What do you think is causing the stomach pain? (e.g., gallstones, recent abdominal surgery)     Gallbladder  10. OTHER SYMPTOMS: Do you have any other symptoms? (e.g., back pain, diarrhea, fever, urination pain,  vomiting)       vomiting  Protocols used: Abdominal Pain - Female-A-AH

## 2024-01-04 ENCOUNTER — Encounter: Admitting: Family

## 2024-01-05 NOTE — Progress Notes (Signed)
   This encounter was created in error - please disregard. No show

## 2024-01-29 ENCOUNTER — Encounter: Admitting: Family

## 2024-01-29 NOTE — Progress Notes (Signed)
   This encounter was created in error - please disregard. No show

## 2024-05-29 ENCOUNTER — Encounter (HOSPITAL_COMMUNITY): Payer: Self-pay

## 2024-05-29 ENCOUNTER — Other Ambulatory Visit (HOSPITAL_COMMUNITY): Payer: Self-pay

## 2024-05-29 ENCOUNTER — Ambulatory Visit (HOSPITAL_COMMUNITY)
Admission: EM | Admit: 2024-05-29 | Discharge: 2024-05-29 | Disposition: A | Discharge status: Home / Self Care | Attending: Internal Medicine | Admitting: Internal Medicine

## 2024-05-29 DIAGNOSIS — K529 Noninfective gastroenteritis and colitis, unspecified: Secondary | ICD-10-CM

## 2024-05-29 DIAGNOSIS — R0981 Nasal congestion: Secondary | ICD-10-CM

## 2024-05-29 DIAGNOSIS — R112 Nausea with vomiting, unspecified: Secondary | ICD-10-CM

## 2024-05-29 LAB — POCT INFLUENZA A/B
Influenza A, POC: NEGATIVE
Influenza B, POC: NEGATIVE

## 2024-05-29 MED ORDER — ONDANSETRON 4 MG PO TBDP
4.0000 mg | ORAL_TABLET | Freq: Three times a day (TID) | ORAL | 0 refills | Status: AC | PRN
  Filled 2024-05-29: qty 20, 7d supply, fill #0

## 2024-05-29 NOTE — ED Triage Notes (Signed)
 Patient reports that she has had N/D x 3 days. Patient states I pretty much have gotten over this and want to make sure I am not contagious. Patient states she is needing a work note.  Patient states she has been taking an OTC motion sickness  pill

## 2024-05-29 NOTE — ED Provider Notes (Signed)
 " [facility]    CSN: [id number] Arrival date & time: 05/29/24  1513      History   Chief Complaint Chief Complaint  Patient presents with   Emesis   Diarrhea    HPI Elizabeth Fisher is a 34 y.o. female.   34 year old female presents urgent care with complaints of nausea, vomiting, diarrhea, fevers, chills and nasal congestion.  Her symptoms started 3 days ago.  Her symptoms have gotten a fair amount better but she is still having a little bit of congestion, nausea and diarrhea.  She has been taking Dramamine for her symptoms.  She has not taken her temperature at home so is unsure if she actually was running a fever but has felt hot and cold.  She denies any known exposures.  She is needing to be tested for the flu before she is able to return to work.  She is able to drink fluids.  She denies dysuria, hematuria or abdominal pain.   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   Supervision of normal first pregnancy, antepartum 07/09/2018    Nursing Staff Provider Office Location  [facility] Dating  LMP Language  English Anatomy US   Normal Flu Vaccine  Declined Genetic Screen  NIPS: Low risk female   AFP: Negative   TDaP vaccine   10/31/2018 Hgb A1C or  GTT Early  Third trimester Normal 85-105-95 Rhogam  N/A   LAB RESULTS  Feeding Plan Breast Blood Type --/--/AB POS, AB POS Performed at [facility], [address] [place]   Vomiting, unspecified     Patient Active Problem List   Diagnosis Date Noted   Chronic hypertension complicating or reason for care during pregnancy, third trimester 01/17/2019    Encounter for induction of labor 01/17/2019   Second degree perineal laceration, delivered, current hospitalization 01/17/2019   SVD (spontaneous vaginal delivery) 01/17/2019   Transient hypertension of pregnancy 06/24/2018   Hyperemesis gravidarum with metabolic disturbance, antepartum 06/22/2018    Past Surgical History:  Procedure Laterality Date   UPPER GI ENDOSCOPY     WISDOM TOOTH EXTRACTION      OB History     Gravida  2   Para  1   Term  1   Preterm      AB  1   Living  1      SAB  1   IAB      Ectopic      Multiple  0   Live Births  1            Home Medications    Prior to Admission medications  Medication Sig Start Date End Date Taking? Authorizing Provider  ondansetron  (ZOFRAN -ODT) 4 MG disintegrating tablet Take 1 tablet (4 mg total) by mouth every 8 (eight) hours as needed for nausea or vomiting. 05/29/24  Yes Randi Poullard A, PA-C  acetaminophen  (TYLENOL ) 500 MG tablet Take 500 mg by mouth daily as needed for mild pain, moderate pain, fever or headache.    [provider]  amLODipine  (NORVASC ) 5 MG tablet Take 1 tablet (5 mg total)  by mouth daily. Patient not taking: Reported on 04/14/2022 01/09/20   Celestia Rosaline SQUIBB, NP  bismuth subsalicylate (PEPTO BISMOL) 262 MG chewable tablet Chew 524 mg by mouth 2 (two) times daily as needed for diarrhea or loose stools or indigestion.    [provider]  Carboxymethylcellulose Sodium (DRY EYE RELIEF OP) Place 1 drop into both eyes 2 (two) times daily as needed (dry eyes).    [provider]  famotidine  (PEPCID ) 20 MG tablet Take 1 tablet (20 mg total) by mouth 2 (two) times daily. Patient not taking: Reported on 05/29/2024 04/14/22   Blue, Soijett A, PA-C  hydrOXYzine  (ATARAX ) 10 MG tablet Take 1 tablet (10 mg total) by mouth 2 (two) times daily as needed. 09/12/22     medroxyPROGESTERone  (DEPO-PROVERA ) 150 MG/ML injection Inject 1 mL (150 mg total) into the muscle every 3  (three) months. Patient not taking: Reported on 05/29/2024 09/13/22     medroxyPROGESTERone  Acetate (DEPO-PROVERA ) 150 MG/ML SUSY Inject 1 mL (150 mg total) into the muscle every 3 (three) months. 06/16/22     Menthol , Topical Analgesic, (BENGAY EX) Apply 1 patch topically daily as needed (pain). Patient not taking: Reported on 05/29/2024    [provider]  omeprazole  (PRILOSEC) 20 MG capsule Take 1 capsule (20 mg total) by mouth daily. 12/26/23   Richad Jon HERO, NP  sucralfate  (CARAFATE ) 1 g tablet Take 1 tablet (1 g total) by mouth 4 (four) times daily -  with meals and at bedtime. Patient not taking: Reported on 05/29/2024 12/26/23   Richad Jon HERO, NP    Family History Family History  Problem Relation Age of Onset   Post-traumatic stress disorder Mother     Social History Social History[1]   Allergies   Patient has no known allergies.   Review of Systems Review of Systems  Constitutional:  Positive for appetite change, chills and fever.  HENT:  Positive for congestion. Negative for ear pain and sore throat.   Eyes:  Negative for pain and visual disturbance.  Respiratory:  Negative for cough and shortness of breath.   Cardiovascular:  Negative for chest pain and palpitations.  Gastrointestinal:  Positive for diarrhea, nausea and vomiting. Negative for abdominal pain.  Genitourinary:  Negative for dysuria and hematuria.  Musculoskeletal:  Negative for arthralgias and back pain.  Skin:  Negative for color change and rash.  Neurological:  Negative for seizures and syncope.  All other systems reviewed and are negative.    Physical Exam Triage Vital Signs ED Triage Vitals  Encounter Vitals Group     BP 05/29/24 1655 120/79     Girls Systolic BP Percentile --      Girls Diastolic BP Percentile --      Boys Systolic BP Percentile --      Boys Diastolic BP Percentile --      Pulse Rate 05/29/24 1655 72     Resp 05/29/24 1655 14     Temp 05/29/24 1655 98.3 F (36.8 C)      Temp Source 05/29/24 1655 Oral     SpO2 05/29/24 1655 98 %     Weight --      Height --      Head Circumference --      Peak Flow --      Pain Score 05/29/24 1657 0     Pain Loc --      Pain Education --      Exclude from Growth Chart --  No data found.  Updated Vital Signs BP 120/79 (BP Location: Left Arm)   Pulse 72   Temp 98.3 F (36.8 C) (Oral)   Resp 14   LMP 05/17/2024   SpO2 98%   Visual Acuity Right Eye Distance:   Left Eye Distance:   Bilateral Distance:    Right Eye Near:   Left Eye Near:    Bilateral Near:     Physical Exam Vitals and nursing note reviewed.  Constitutional:      General: She is not in acute distress.    Appearance: She is well-developed.  HENT:     Head: Normocephalic and atraumatic.     Right Ear: Tympanic membrane normal.     Left Ear: Tympanic membrane normal.     Nose: Nose normal.     Mouth/Throat:     Mouth: Mucous membranes are moist.  Eyes:     Conjunctiva/sclera: Conjunctivae normal.  Cardiovascular:     Rate and Rhythm: Normal rate and regular rhythm.     Heart sounds: No murmur heard. Pulmonary:     Effort: Pulmonary effort is normal. No respiratory distress.     Breath sounds: Normal breath sounds.  Abdominal:     General: Bowel sounds are normal. There is no distension.     Palpations: Abdomen is soft. There is no mass.     Tenderness: There is no abdominal tenderness. There is no guarding or rebound.     Hernia: No hernia is present.  Musculoskeletal:        General: No swelling.     Cervical back: Neck supple.  Skin:    General: Skin is warm and dry.     Capillary Refill: Capillary refill takes less than 2 seconds.  Neurological:     Mental Status: She is alert.  Psychiatric:        Mood and Affect: Mood normal.      UC Treatments / Results  Labs (all labs ordered are listed, but only abnormal results are displayed) Labs Reviewed  POCT INFLUENZA A/B    EKG   Radiology No results  found.  Procedures Procedures (including critical care time)  Medications Ordered in UC Medications - No data to display  Initial Impression / Assessment and Plan / UC Course  I have reviewed the triage vital signs and the nursing notes.  Pertinent labs & imaging results that were available during my care of the patient were reviewed by me and considered in my medical decision making (see chart for details).     Gastroenteritis  Nasal congestion - Plan: POC Influenza A/B, POC Influenza A/B  Nausea, vomiting and diarrhea - Plan: POC Influenza A/B, POC Influenza A/B   Flu A and flu B testing done today.  These are negative.  Symptoms and physical exam findings are more consistent with gastroenteritis that is now improving.  This normally takes 5 to 6 days to completely resolve.  At this point recommend staying hydrated even if you do not feel like taking in solid foods.  We will send in a prescription for Zofran  4 mg every 8 hours as needed for nausea/vomiting.  If you feel that you are unable to maintain hydration then recommend returning to urgent care or going to the emergency room.  Final Clinical Impressions(s) / UC Diagnoses   Final diagnoses:  Nasal congestion  Nausea, vomiting and diarrhea  Gastroenteritis     Discharge Instructions      Flu A and flu B testing  done today.  These are negative.  Symptoms and physical exam findings are more consistent with gastroenteritis that is now improving.  This normally takes 5 to 6 days to completely resolve.  At this point recommend staying hydrated even if you do not feel like taking in solid foods.  We will send in a prescription for Zofran  4 mg every 8 hours as needed for nausea/vomiting.  If you feel that you are unable to maintain hydration then recommend returning to urgent care or going to the emergency room.     ED Prescriptions     Medication Sig Dispense Auth. Provider   ondansetron  (ZOFRAN -ODT) 4 MG disintegrating  tablet Take 1 tablet (4 mg total) by mouth every 8 (eight) hours as needed for nausea or vomiting. 20 tablet Teresa Almarie LABOR, [place]      PDMP not reviewed this encounter.    [1]  Social History Tobacco Use   Smoking status: Never   Smokeless tobacco: Never  Vaping Use   Vaping status: Never Used  Substance Use Topics   Alcohol use: Not Currently   Drug use: Yes    Types: Marijuana     Teresa Almarie LABOR, PA-C 05/29/24 1749  "

## 2024-05-29 NOTE — Discharge Instructions (Addendum)
 Flu A and flu B testing done today.  These are negative.  Symptoms and physical exam findings are more consistent with gastroenteritis that is now improving.  This normally takes 5 to 6 days to completely resolve.  At this point recommend staying hydrated even if you do not feel like taking in solid foods.  We will send in a prescription for Zofran  4 mg every 8 hours as needed for nausea/vomiting.  If you feel that you are unable to maintain hydration then recommend returning to urgent care or going to the emergency room.
